# Patient Record
Sex: Male | Born: 1960 | Race: White | Hispanic: No | Marital: Married | Smoking: Never smoker
Health system: Southern US, Community
[De-identification: ages and names within clinical notes are randomized; demographics above are authoritative.]

## PROBLEM LIST (undated history)

## (undated) DIAGNOSIS — E785 Hyperlipidemia, unspecified: Secondary | ICD-10-CM

## (undated) DIAGNOSIS — N189 Chronic kidney disease, unspecified: Secondary | ICD-10-CM

## (undated) DIAGNOSIS — R053 Chronic cough: Secondary | ICD-10-CM

## (undated) DIAGNOSIS — J42 Unspecified chronic bronchitis: Secondary | ICD-10-CM

## (undated) DIAGNOSIS — T7840XA Allergy, unspecified, initial encounter: Secondary | ICD-10-CM

## (undated) DIAGNOSIS — K219 Gastro-esophageal reflux disease without esophagitis: Secondary | ICD-10-CM

## (undated) DIAGNOSIS — J449 Chronic obstructive pulmonary disease, unspecified: Secondary | ICD-10-CM

## (undated) DIAGNOSIS — G4730 Sleep apnea, unspecified: Secondary | ICD-10-CM

## (undated) DIAGNOSIS — I10 Essential (primary) hypertension: Secondary | ICD-10-CM

## (undated) DIAGNOSIS — D235 Other benign neoplasm of skin of trunk: Principal | ICD-10-CM

## (undated) DIAGNOSIS — R05 Cough: Secondary | ICD-10-CM

## (undated) DIAGNOSIS — E119 Type 2 diabetes mellitus without complications: Secondary | ICD-10-CM

## (undated) DIAGNOSIS — G2581 Restless legs syndrome: Secondary | ICD-10-CM

## (undated) HISTORY — DX: Essential (primary) hypertension: I10

## (undated) HISTORY — DX: Chronic obstructive pulmonary disease, unspecified: J44.9

## (undated) HISTORY — PX: POLYPECTOMY: SHX149

## (undated) HISTORY — DX: Hyperlipidemia, unspecified: E78.5

## (undated) HISTORY — PX: COLONOSCOPY: SHX174

## (undated) HISTORY — DX: Unspecified chronic bronchitis: J42

## (undated) HISTORY — DX: Other benign neoplasm of skin of trunk: D23.5

## (undated) HISTORY — DX: Chronic cough: R05.3

## (undated) HISTORY — DX: Type 2 diabetes mellitus without complications: E11.9

## (undated) HISTORY — DX: Chronic kidney disease, unspecified: N18.9

## (undated) HISTORY — DX: Allergy, unspecified, initial encounter: T78.40XA

## (undated) HISTORY — DX: Sleep apnea, unspecified: G47.30

## (undated) HISTORY — DX: Gastro-esophageal reflux disease without esophagitis: K21.9

## (undated) HISTORY — DX: Cough: R05

## (undated) HISTORY — DX: Restless legs syndrome: G25.81

---
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
## 1981-02-23 HISTORY — PX: SEPTOPLASTY: SUR1290

## 2001-10-14 ENCOUNTER — Encounter: Payer: Self-pay | Admitting: Family Medicine

## 2001-10-14 LAB — CONVERTED CEMR LAB: Blood Glucose, Fasting: 102 mg/dL

## 2002-04-03 ENCOUNTER — Encounter: Payer: Self-pay | Admitting: Family Medicine

## 2002-04-03 LAB — CONVERTED CEMR LAB
Blood Glucose, Fasting: 105 mg/dL
RBC count: 5.32 10*6/uL
WBC, blood: 7.2 10*3/uL

## 2003-05-28 ENCOUNTER — Ambulatory Visit (HOSPITAL_BASED_OUTPATIENT_CLINIC_OR_DEPARTMENT_OTHER): Admission: RE | Admit: 2003-05-28 | Discharge: 2003-05-28 | Payer: Self-pay | Admitting: Family Medicine

## 2003-09-03 HISTORY — PX: FOOT SURGERY: SHX648

## 2005-01-01 ENCOUNTER — Ambulatory Visit: Payer: Self-pay | Admitting: Family Medicine

## 2005-04-08 ENCOUNTER — Ambulatory Visit: Payer: Self-pay | Admitting: Family Medicine

## 2007-05-20 ENCOUNTER — Ambulatory Visit: Payer: Self-pay | Admitting: Family Medicine

## 2007-07-25 ENCOUNTER — Ambulatory Visit: Payer: Self-pay | Admitting: Family Medicine

## 2007-07-25 DIAGNOSIS — T7840XA Allergy, unspecified, initial encounter: Secondary | ICD-10-CM | POA: Insufficient documentation

## 2007-08-10 ENCOUNTER — Ambulatory Visit: Payer: Self-pay | Admitting: Family Medicine

## 2007-08-31 ENCOUNTER — Encounter: Payer: Self-pay | Admitting: Family Medicine

## 2007-09-11 ENCOUNTER — Encounter: Payer: Self-pay | Admitting: Family Medicine

## 2008-03-07 ENCOUNTER — Encounter: Payer: Self-pay | Admitting: Family Medicine

## 2008-03-07 DIAGNOSIS — G479 Sleep disorder, unspecified: Secondary | ICD-10-CM | POA: Insufficient documentation

## 2008-03-07 DIAGNOSIS — R030 Elevated blood-pressure reading, without diagnosis of hypertension: Secondary | ICD-10-CM | POA: Insufficient documentation

## 2008-03-07 DIAGNOSIS — Z8719 Personal history of other diseases of the digestive system: Secondary | ICD-10-CM | POA: Insufficient documentation

## 2008-03-07 DIAGNOSIS — G4730 Sleep apnea, unspecified: Secondary | ICD-10-CM | POA: Insufficient documentation

## 2008-03-07 DIAGNOSIS — M109 Gout, unspecified: Secondary | ICD-10-CM | POA: Insufficient documentation

## 2008-07-05 ENCOUNTER — Telehealth: Payer: Self-pay | Admitting: Family Medicine

## 2008-11-12 ENCOUNTER — Ambulatory Visit: Payer: Self-pay | Admitting: Family Medicine

## 2008-11-12 DIAGNOSIS — R079 Chest pain, unspecified: Secondary | ICD-10-CM | POA: Insufficient documentation

## 2008-11-19 ENCOUNTER — Ambulatory Visit: Payer: Self-pay | Admitting: Family Medicine

## 2008-12-03 ENCOUNTER — Telehealth: Payer: Self-pay | Admitting: Family Medicine

## 2009-01-16 ENCOUNTER — Encounter (INDEPENDENT_AMBULATORY_CARE_PROVIDER_SITE_OTHER): Payer: Self-pay | Admitting: Internal Medicine

## 2009-04-26 ENCOUNTER — Ambulatory Visit: Payer: Self-pay | Admitting: Family Medicine

## 2009-05-02 ENCOUNTER — Ambulatory Visit: Payer: Self-pay | Admitting: Family Medicine

## 2009-05-09 ENCOUNTER — Ambulatory Visit: Payer: Self-pay | Admitting: Family Medicine

## 2009-06-13 ENCOUNTER — Ambulatory Visit: Payer: Self-pay | Admitting: Family Medicine

## 2009-06-17 ENCOUNTER — Telehealth: Payer: Self-pay | Admitting: Family Medicine

## 2009-06-21 ENCOUNTER — Telehealth: Payer: Self-pay | Admitting: Family Medicine

## 2009-06-24 ENCOUNTER — Ambulatory Visit: Payer: Self-pay | Admitting: Internal Medicine

## 2009-06-24 ENCOUNTER — Telehealth: Payer: Self-pay | Admitting: Family Medicine

## 2009-06-24 ENCOUNTER — Telehealth (INDEPENDENT_AMBULATORY_CARE_PROVIDER_SITE_OTHER): Payer: Self-pay | Admitting: *Deleted

## 2009-06-24 DIAGNOSIS — R05 Cough: Secondary | ICD-10-CM | POA: Insufficient documentation

## 2009-06-24 DIAGNOSIS — J45901 Unspecified asthma with (acute) exacerbation: Secondary | ICD-10-CM | POA: Insufficient documentation

## 2009-07-08 ENCOUNTER — Ambulatory Visit: Payer: Self-pay | Admitting: Internal Medicine

## 2009-07-11 ENCOUNTER — Ambulatory Visit: Payer: Self-pay | Admitting: Internal Medicine

## 2009-07-11 LAB — CONVERTED CEMR LAB: IgE (Immunoglobulin E), Serum: 4.8 intl units/mL (ref 0.0–180.0)

## 2009-07-30 ENCOUNTER — Ambulatory Visit: Payer: Self-pay | Admitting: Internal Medicine

## 2009-09-10 ENCOUNTER — Ambulatory Visit: Payer: Self-pay | Admitting: Internal Medicine

## 2009-10-01 ENCOUNTER — Encounter (INDEPENDENT_AMBULATORY_CARE_PROVIDER_SITE_OTHER): Payer: Self-pay | Admitting: *Deleted

## 2009-10-29 ENCOUNTER — Telehealth: Payer: Self-pay | Admitting: Family Medicine

## 2010-03-25 NOTE — Progress Notes (Signed)
Summary: flexeril  Phone Note Refill Request Message from:  Fax from Pharmacy on October 29, 2009 11:36 AM  Refills Requested: Medication #1:  FLEXERIL 10 MG  TABS Take 1 tablet by mouth at bedtime as needed   Last Refilled: 12/03/2008 Refill request from [facility].   Initial call taken by: Melody Comas,  October 29, 2009 11:37 AM  Follow-up for Phone Call       Follow-up by: Crawford Givens MD,  October 29, 2009 1:46 PM    Prescriptions: FLEXERIL 10 MG  TABS (CYCLOBENZAPRINE HCL) Take 1 tablet by mouth at bedtime as needed  #30 x 5   Entered and Authorized by:   Crawford Givens MD   Signed by:   Crawford Givens MD on 10/29/2009   Method used:   Electronically to        [facility]* (retail)       [address]       [place], [place]  [address]       Ph: [phone]       Fax: [phone]   RxID:   [id number]

## 2010-03-25 NOTE — Assessment & Plan Note (Signed)
Summary: 1 WK F/U DLO   Vital Signs:  Patient profile:   50 year old male Weight:      291.75 pounds Temp:     97.8 degrees F oral Pulse rate:   92 / minute Pulse rhythm:   regular BP sitting:   142 / 92  (left arm) Cuff size:   large  Vitals Entered By: Sydell Axon LPN (May 09, 2009 8:57 AM) CC: One week follow-up, still has cough   History of Present Illness: Pt here for followup of bronchitis. He has been very compliant and in general is slightly better but has at laest two episodes of paroxysms of coughing that get him to the heaving process. He has no fever and when he is over the paroxysm, feels pretty good the rest of the day. When he coughs he has production some of the time, always clear. He notices minimal wheezing. He has been doing everything asked of him. He finds using the [employer] helpful, drinking enough fluids is the challenge.  Problems Prior to Update: 1)  Bronchitis- Acute  (ICD-466.0) 2)  Chest Pain Unspecified  (ICD-786.50) 3)  Extrinsic Asthma, Unspecified (DR [place])  (ICD-493.00) 4)  Sleep Disorder  (ICD-780.50) 5)  Elevated Blood Pressure Without Diagnosis of Hypertension  (ICD-796.2) 6)  Hemorrhoids, Hx of  (ICD-V12.79) 7)  Gout  (ICD-274.9) 8)  Allergy, Environmental  (ICD-995.3)  Medications Prior to Update: 1)  Allopurinol 300 Mg Tabs (Allopurinol) .... Take 1 Tablet By Mouth Once A Day 2)  Flexeril 10 Mg  Tabs (Cyclobenzaprine Hcl) .... Take 1 Tablet By Mouth At Bedtime 3)  Claritin 10 Mg  Tabs (Loratadine) .... Take 1 Tablet By Mouth Once A Day 4)  Symbicort 80-4.5 Mcg/act Aero (Budesonide-Formoterol Fumarate) .... Use 2 Puffs Every Am As Needed 5)  Ventolin Hfa 108 (90 Base) Mcg/act Aers (Albuterol Sulfate) .... 2 Puffs Inhaled Every 4-6 Hours As Needed Wheeze 6)  Augmentin 500-125 Mg Tabs (Amoxicillin-Pot Clavulanate) .... One Tab By Mouth Three Times A Day 7)  Tessalon 200 Mg Caps (Benzonatate) .... One Tab By Mouth Three Times A  Day  Allergies: 1)  ! * Codeine  Physical Exam  General:  Well-developed,well-nourished,in no acute distress; alert,appropriate and cooperative throughout examination Head:  Normocephalic and atraumatic without obvious abnormalities. No apparent alopecia or balding. Sinuses NT. Eyes:  Conjunctiva clear bilat. Ears:  External ear exam shows no significant lesions or deformities.  Otoscopic examination reveals clear canals, tympanic membranes are intact bilaterally without bulging, retraction, inflammation or discharge. Hearing is grossly normal bilaterally. Nose:  External nasal examination shows no deformity or inflammation. Nasal mucosa are pink and moist without lesions or exudates. Mouth:  Oral mucosa and oropharynx without lesions or exudates.  Teeth in good repair. Neck:  no carotid bruit or thyromegaly no cervical or supraclavicular lymphadenopathy  Lungs:  Normal respiratory effort, chest expands symmetrically. Lungs are clear to auscultation, no crackles or wheezes. Wet , productivre cough. Heart:  Normal rate and regular rhythm. S1 and S2 normal without gallop, murmur, click, rub or other extra sounds.   Impression & Recommendations:  Problem # 1:  BRONCHITIS- ACUTE (ICD-466.0) Assessment Improved  Better slightly. Will change to Zithromax today and then finish Augmentin thereafter.  Increase Symbicort and start prolonged Prednisone Pulse. His updated medication list for this problem includes:    Symbicort 80-4.5 Mcg/act Aero (Budesonide-formoterol fumarate) ..... Use 2 puffs every am as needed    Ventolin Hfa 108 (90 Base) Mcg/act Aers (  Albuterol sulfate) .Marland Kitchen... 2 puffs inhaled every 4-6 hours as needed wheeze    Augmentin 500-125 Mg Tabs (Amoxicillin-pot clavulanate) ..... One tab by mouth three times a day    Tessalon 200 Mg Caps (Benzonatate) ..... One tab by mouth three times a day    Guaifenesin 400 Mg Tabs (Guaifenesin) .Marland Kitchen... Take one by mouth three times a day     Zithromax Z-pak 250 Mg Tabs (Azithromycin) .Marland Kitchen... As dir  Take antibiotics and other medications as directed. Encouraged to push clear liquids, get enough rest, and take acetaminophen as needed. To be seen in 5-7 days if no improvement, sooner if worse.  Problem # 2:  ELEVATED BLOOD PRESSURE WITHOUT DIAGNOSIS OF HYPERTENSION (ICD-796.2) Assessment: Unchanged  Assess a week after steroid taper finished.  BP today: 142/92 Prior BP: 126/90 (05/02/2009)  Instructed in low sodium diet (DASH Handout) and behavior modification.    Complete Medication List: 1)  Allopurinol 300 Mg Tabs (Allopurinol) .... Take 1 tablet by mouth once a day 2)  Flexeril 10 Mg Tabs (Cyclobenzaprine hcl) .... Take 1 tablet by mouth at bedtime 3)  Claritin 10 Mg Tabs (Loratadine) .... Take 1 tablet by mouth once a day 4)  Symbicort 80-4.5 Mcg/act Aero (Budesonide-formoterol fumarate) .... Use 2 puffs every am as needed 5)  Ventolin Hfa 108 (90 Base) Mcg/act Aers (Albuterol sulfate) .... 2 puffs inhaled every 4-6 hours as needed wheeze 6)  Augmentin 500-125 Mg Tabs (Amoxicillin-pot clavulanate) .... One tab by mouth three times a day 7)  Tessalon 200 Mg Caps (Benzonatate) .... One tab by mouth three times a day 8)  Guaifenesin 400 Mg Tabs (Guaifenesin) .... Take one by mouth three times a day 9)  Tylenol Extra Strength 500 Mg Tabs (Acetaminophen) .... As needed 10)  Prednisone 10 Mg Tabs (Prednisone) .... 5 tabs by mouth in am for 5 days, then decrease every three days until take 1/2 tab for 6 days. 11)  Zithromax Z-pak 250 Mg Tabs (Azithromycin) .... As dir  Patient Instructions: 1)  RTC after steroid taper finished. 2)  Check BP then. Prescriptions: ZITHROMAX Z-PAK 250 MG TABS (AZITHROMYCIN) as dir  #1 pak x 0   Entered and Authorized by:   Shaune Leeks MD   Signed by:   Shaune Leeks MD on 05/09/2009   Method used:   Electronically to        [facility]* (retail)       [address]        [place], [place]  [address]       Ph: [phone]       Fax: [phone]   RxID:   [id number]   Current Allergies (reviewed today): ! * CODEINE  Appended Document: 1 WK F/U DLO

## 2010-03-25 NOTE — Assessment & Plan Note (Signed)
Summary: PAIN & CONGESTION IN CHEST / LFW   Vital Signs:  Patient profile:   50 year old male Height:      71.5 inches Weight:      278 pounds BMI:     38.37 Temp:     97.8 degrees F oral Pulse rate:   88 / minute Pulse rhythm:   regular BP sitting:   120 / 88  (left arm) Cuff size:   large  Vitals Entered By: Sydell Axon LPN (November 12, 2008 12:23 PM) CC: Productive cough-white, pain and congestion in chest   History of Present Illness: Pt saw Dr Corinda Gubler last year. Had allergy testing and did well until the weather changes. He had inhalers from before and had some Prednisone to take if needed.  He got cold two weeks ago and then got "hacking his lungs out"  He used 3/2/1 without much improvement. Used decongestant which has made his pressure go up. He got clammy and sweaty yesterday [place] from [place]. He is worried about his heart.  His chest is bothering him. He recently did work above his head on a ladder.  He is having chest pain which is the major problem. Sat he did the work....putting up smoke detectors, replacing a light fixture above his head on a ladder. He also has a stressful job, is overweight, and doesn't exercise. he chest pain does not get worse with exertion, walking the worst. Is also coughing hard at times...has coughing fits. Couldn't get cool enough coming back in the car. He had no nausea.  Son is Public relations account executive and was discussing sxs.  Problems Prior to Update: 1)  Sleep Disorder  (ICD-780.50) 2)  Elevated Blood Pressure Without Diagnosis of Hypertension  (ICD-796.2) 3)  Hemorrhoids, Hx of  (ICD-V12.79) 4)  Gout  (ICD-274.9) 5)  Allergy, Environmental  (ICD-995.3)  Medications Prior to Update: 1)  Allopurinol 300 Mg Tabs (Allopurinol) .... Take 1 Tablet By Mouth Once A Day 2)  Flexeril 10 Mg  Tabs (Cyclobenzaprine Hcl) .... Take 1 Tablet By Mouth At Bedtime 3)  Claritin 10 Mg  Tabs (Loratadine) .... Take 1 Tablet By Mouth Once A Day  Allergies: 1)  ! *  Codeine  Physical Exam  General:  Well-developed,well-nourished,in no acute distress; alert,appropriate and cooperative throughout examination, nontoxic and composed appearing. Head:  Normocephalic and atraumatic without obvious abnormalities. No apparent alopecia or balding. Eyes:  Conjunctiva clear bilaterally.  Ears:  External ear exam shows no significant lesions or deformities.  Otoscopic examination reveals clear canals, tympanic membranes are intact bilaterally without bulging, retraction, inflammation or discharge. Hearing is grossly normal bilaterally. Nose:  External nasal examination shows no deformity or inflammation. Nasal mucosa are pink and moist without lesions or exudates. Mouth:  Oral mucosa and oropharynx without lesions or exudates.  Teeth in good repair. Neck:  No deformities, masses, or tenderness noted. Chest Wall:  No deformities, masses, tenderness or gynecomastia noted. Lungs:  Normal respiratory effort, chest expands symmetrically. Lungs are clear to auscultation, no crackles or wheezes. Good airflow but reactive cough with deep breathing. Heart:  Normal rate and regular rhythm. S1 and S2 normal without gallop, murmur, click, rub or other extra sounds.   Impression & Recommendations:  Problem # 1:  EXTRINSIC ASTHMA, UNSPECIFIED (DR [place]) (ICD-493.00) Assessment Deteriorated See instructions. His updated medication list for this problem includes:    Symbicort 80-4.5 Mcg/act Aero (Budesonide-formoterol fumarate) ..... Use 2 puffs every am as needed    Prednisone 50 Mg  Tabs (Prednisone) ..... One tab by mouth in am  Problem # 2:  ELEVATED BLOOD PRESSURE WITHOUT DIAGNOSIS OF HYPERTENSION (ICD-796.2) Assessment: Improved  Normotensive today. Wt loss will help even more.  BP today: 120/88 Prior BP: 115/78 (08/10/2007)  Instructed in low sodium diet (DASH Handout) and behavior modification.    Problem # 3:  CHEST PAIN UNSPECIFIED (ICD-786.50) Assessment: New   Does not appear to be the heart. EKG nml with Discomfort. Prob unusual work with anxiety overlay. Will recheck next week.  Orders: EKG w/ Interpretation (93[address])  Complete Medication List: 1)  Allopurinol 300 Mg Tabs (Allopurinol) .... Take 1 tablet by mouth once a day 2)  Flexeril 10 Mg Tabs (Cyclobenzaprine hcl) .... Take 1 tablet by mouth at bedtime 3)  Claritin 10 Mg Tabs (Loratadine) .... Take 1 tablet by mouth once a day 4)  Symbicort 80-4.5 Mcg/act Aero (Budesonide-formoterol fumarate) .... Use 2 puffs every am as needed 5)  Prednisone 50 Mg Tabs (Prednisone) .... One tab by mouth in am  Patient Instructions: 1)  Prednisone 50mg  first thing in the AM. for five days. 2)  Cont Symbicort regularly....2 inhalations twice a day. 3)  Use Albuterol only as needed. 4)  Keep lozenge in mouth. 5)  Cont Claritin. 6)  RTC one week. Prescriptions: PREDNISONE 50 MG TABS (PREDNISONE) one tab by mouth in AM  #5 x 0   Entered and Authorized by:   Shaune Leeks MD   Signed by:   Shaune Leeks MD on 11/12/2008   Method used:   Electronically to        [facility]* (retail)       [address]       [place], [place]  [address]       Ph: [phone]       Fax: [phone]   RxID:   [id number]   Current Allergies (reviewed today): ! * CODEINE

## 2010-03-25 NOTE — Progress Notes (Signed)
Summary: Pt wants call back from Dr. Dayton Martes   Phone Note Call from Patient   Caller: Patient Summary of Call: Pt was referred to the Pulumonary Dept w/ Dr. Shelle Iron.   Pts called says he has not be contacted for his Pulumonary appt w/ Dr. Shelle Iron. My I comments indicate appt was scheduled and pt was contacted  on April 26th ( however I  do not remember)  .Marland KitchenPt says he is not any better, medication is not helping.  I have rescheduled appt w/ their first avail Dr. Sherene Sires on May 2nd,Monday . Not sure if  pt is pleased w/ this appt.  Pt would like a call  back from Dr. Dayton Martes.Marland KitchenMarland KitchenDaine Gip  June 21, 2009 12:18 PM Call back # [phone] Initial call taken by: Daine Gip,  June 21, 2009 12:21 PM  Follow-up for Phone Call        I am extremely busy today and do not have time to call him back.  What exactly is asking for?  Does Lugene need to try to call him? Ruthe Mannan MD  June 21, 2009 12:33 PM   Additional Follow-up for Phone Call Additional follow up Details #1::        Pt went to the appt w/ Dr. Sherene Sires  (pulumonary )on Monday. He wanted to make sure you were aware that he was no better..sigbn Additional Follow-up by: Daine Gip,  Jun 25, 2009 9:40 AM    Additional Follow-up for Phone Call Additional follow up Details #2::    I'm not sure what he wants [place] to do at this point.  I read Dr. Thurston Hole note.  It will take time for the medications to take effect.  I would like Dr. Hetty Ely know at his next appointment but keep taking medications as prescribed.   Ruthe Mannan MD  Jun 25, 2009 9:42 AM  Spoke with patient and advised that it may take some time for the medication to take effect but that if he is not doing better in the next day or two, he might want to try to contact Dr. Thurston Hole office or make an appt. here.  Lugene Fuquay CMA (AAMA)  Jun 25, 2009 11:08 AM

## 2010-03-25 NOTE — Assessment & Plan Note (Signed)
Summary: FOLLOW UP DR BEDSOLE/[facility]   Vital Signs:  Patient profile:   49 year old male Weight:      286 pounds Temp:     97.8 degrees F oral Pulse rate:   92 / minute Pulse rhythm:   regular BP sitting:   126 / 90  (left arm) Cuff size:   large  Vitals Entered By: Sydell Axon LPN (May 02, 2009 12:50 PM) CC: Follow-up on cough per Dr. Ermalene Searing   History of Present Illness: Mr. Courville is a 50 y/o male who presents today for a f/u from an exacerbation of bronchitis last week.  Patient has a history of bronchitis and was seen last week for an exacerbation that led to bouts of coughing "so much that it hurt."  Dr. Ermalene Searing prescribed Prednisone which he has just completed and could tell a mild difference.  He also has a history of allergies and attacks with difficulty breathing.  He uses albuterol as a rescue inhaler which helps but he often needs about 20 minutes to regain his breath.  About 6 months ago, he was prescribed an antibiotic after which he was free from symptoms until last week.  That was the longest symptom-free period he can remember.  Problems Prior to Update: 1)  Asthma, Extrinsic, With Acute Exacerbation  (ICD-493.02) 2)  Chest Pain Unspecified  (ICD-786.50) 3)  Extrinsic Asthma, Unspecified (DR [place])  (ICD-493.00) 4)  Sleep Disorder  (ICD-780.50) 5)  Elevated Blood Pressure Without Diagnosis of Hypertension  (ICD-796.2) 6)  Hemorrhoids, Hx of  (ICD-V12.79) 7)  Gout  (ICD-274.9) 8)  Allergy, Environmental  (ICD-995.3)  Medications Prior to Update: 1)  Allopurinol 300 Mg Tabs (Allopurinol) .... Take 1 Tablet By Mouth Once A Day 2)  Flexeril 10 Mg  Tabs (Cyclobenzaprine Hcl) .... Take 1 Tablet By Mouth At Bedtime 3)  Claritin 10 Mg  Tabs (Loratadine) .... Take 1 Tablet By Mouth Once A Day 4)  Symbicort 80-4.5 Mcg/act Aero (Budesonide-Formoterol Fumarate) .... Use 2 Puffs Every Am As Needed 5)  Prednisone 20 Mg Tabs (Prednisone) .... 3 Tabs By Mouth Daily X 3 Days, Then  2 Tabs By Mouth Daily X 2 Days Then 1 Tab By Mouth Daily X 2 Days 6)  Ventolin Hfa 108 (90 Base) Mcg/act Aers (Albuterol Sulfate) .... 2 Puffs Inhaled Every 4-6 Hours As Needed Wheeze  Allergies: 1)  ! * Codeine  Physical Exam  General:  Well-developed,well-nourished,in no acute distress; alert,appropriate and cooperative throughout examination Head:  Normocephalic and atraumatic without obvious abnormalities. No apparent alopecia or balding. Sinuses NT. Eyes:  Conjunctiva clear bilat. Ears:  External ear exam shows no significant lesions or deformities.  Otoscopic examination reveals clear canals, tympanic membranes are intact bilaterally without bulging, retraction, inflammation or discharge. Hearing is grossly normal bilaterally. Nose:  External nasal examination shows no deformity or inflammation. Nasal mucosa are pink and moist without lesions or exudates. Mouth:  Oral mucosa and oropharynx without lesions or exudates.  Teeth in good repair. Lungs:  Normal respiratory effort, chest expands symmetrically. Lungs are clear to auscultation, no crackles or wheezes. Mild ronchi in ant bases bilat. Wet , productivre cough. Heart:  Normal rate and regular rhythm. S1 and S2 normal without gallop, murmur, click, rub or other extra sounds.   Impression & Recommendations:  Problem # 1:  BRONCHITIS- ACUTE (ICD-466.0) Assessment New Actually asthmatic bronchitis...treat per instructions. His updated medication list for this problem includes:    Symbicort 80-4.5 Mcg/act Aero (Budesonide-formoterol fumarate) .Marland KitchenMarland KitchenMarland KitchenMarland Kitchen  Use 2 puffs every am as needed    Ventolin Hfa 108 (90 Base) Mcg/act Aers (Albuterol sulfate) .Marland Kitchen... 2 puffs inhaled every 4-6 hours as needed wheeze    Augmentin 500-125 Mg Tabs (Amoxicillin-pot clavulanate) ..... One tab by mouth three times a day    Tessalon 200 Mg Caps (Benzonatate) ..... One tab by mouth three times a day  Problem # 2:  EXTRINSIC ASTHMA, UNSPECIFIED (DR [place])  (ICD-493.00) Assessment: Improved Mildly improved. See instuctions. The following medications were removed from the medication list:    Prednisone 20 Mg Tabs (Prednisone) .Marland KitchenMarland KitchenMarland KitchenMarland Kitchen 3 tabs by mouth daily x 3 days, then 2 tabs by mouth daily x 2 days then 1 tab by mouth daily x 2 days His updated medication list for this problem includes:    Symbicort 80-4.5 Mcg/act Aero (Budesonide-formoterol fumarate) ..... Use 2 puffs every am as needed    Ventolin Hfa 108 (90 Base) Mcg/act Aers (Albuterol sulfate) .Marland Kitchen... 2 puffs inhaled every 4-6 hours as needed wheeze  Complete Medication List: 1)  Allopurinol 300 Mg Tabs (Allopurinol) .... Take 1 tablet by mouth once a day 2)  Flexeril 10 Mg Tabs (Cyclobenzaprine hcl) .... Take 1 tablet by mouth at bedtime 3)  Claritin 10 Mg Tabs (Loratadine) .... Take 1 tablet by mouth once a day 4)  Symbicort 80-4.5 Mcg/act Aero (Budesonide-formoterol fumarate) .... Use 2 puffs every am as needed 5)  Ventolin Hfa 108 (90 Base) Mcg/act Aers (Albuterol sulfate) .... 2 puffs inhaled every 4-6 hours as needed wheeze 6)  Augmentin 500-125 Mg Tabs (Amoxicillin-pot clavulanate) .... One tab by mouth three times a day 7)  Tessalon 200 Mg Caps (Benzonatate) .... One tab by mouth three times a day  Patient Instructions: 1)  RTC 21 Mar  2)  Take Augmentin  3)  Take Guaifenesin by going to [facility] or [facility] and getting MUCOUS RELIEF EXPECTORANT (400mg ), take 11/2 tabs by mouth AM and NOON. 4)  Drink lots of fluids anytime taking Guaifenesin.  5)  Take Tyl ES 2 tabs three times a day  6)  Take Tessalon for cough 7)  Keep lozenge in mouth  8)  Cont Symbicort and use Albuterol for rescue. Prescriptions: TESSALON 200 MG CAPS (BENZONATATE) one tab by mouth three times a day  #30 x 0   Entered and Authorized by:   Shaune Leeks MD   Signed by:   Shaune Leeks MD on 05/02/2009   Method used:   Electronically to        [facility]* (retail)        [address]       [place], [place]  [address]       Ph: [phone]       Fax: [phone]   RxID:   [id number] AUGMENTIN 500-125 MG TABS (AMOXICILLIN-POT CLAVULANATE) one tab by mouth three times a day  #30 x 0   Entered and Authorized by:   Shaune Leeks MD   Signed by:   Shaune Leeks MD on 05/02/2009   Method used:   Electronically to        [facility]* (retail)       [address]       [place], [place]  [address]       Ph: [phone]       Fax: [phone]   RxID:   [id number]   Current Allergies (reviewed today): ! * CODEINE

## 2010-03-25 NOTE — Assessment & Plan Note (Signed)
Summary: Pulmonary/  f/u ov  response to qvar c/w cough variant asthma   Copy to:  Dr. Dayton Martes Primary Provider/Referring Provider:  Dr. Dayton Martes  CC:  3 wk followup.  Pt states that his cough has resolved.  No complaints today.  Marland Kitchen  History of Present Illness: 50 yowm never smoker no previous hx of allergies or asthma new onset recurrent bronchitis 2005 never completely better since then.  Jun 24, 2009 cc daily cough x 2005 maybe worse in spring and fall seems to peak after stirs around in am can become severe and lightheaded, also occ vomits from coughing.  ventolin may helps some but does not eliminate it.   prednisone helps some, just finished prednisone last month and much worse since stopped it.  minimal actual sputum production. assoc with mild chronic nasal congestion.  rec Prednisone x 12 days Nexium 40 mg Take  one 30-60 min before first meal of the day Pepcid 20 mg at bedtime Delsym 2 tsp every 12 hours as needed for cough Stop symbicort  Jul 08, 2009 2 wk followup.  Pt states that cough had improved while on prednisone.  When he finished prednisone a few days ago cough started to come back.  Cough was prod this am with white/yellow sputum.  This has occurred despite compliance with diet and ppi.   rec add qvar 80 2 bid   July 30, 2009 3 wk followup.  Pt states that his cough has resolved.  No complaints today.  able to do yard work now. Pt denies any significant sore throat, dysphagia, itching, sneezing,  nasal congestion or excess secretions,  fever, chills, sweats, unintended wt loss, pleuritic or exertional cp, hempoptysis, change in activity tolerance  orthopnea pnd or leg swelling Pt also denies any obvious fluctuation in symptoms with weather or environmental change or other alleviating or aggravating factors.       Current Medications (verified): 1)  Allopurinol 300 Mg Tabs (Allopurinol) .... Take 1 Tablet By Mouth Once A Day 2)  Singulair 10 Mg Tabs (Montelukast Sodium) .Marland Kitchen.. 1 By  Mouth Daily 3)  Protonix 40 Mg Tbec (Pantoprazole Sodium) .... Take One By Mouth 30 To 60 Min Before First Meal 4)  Pepcid Ac Maximum Strength 20 Mg Tabs (Famotidine) .... One At Bedtime 5)  Qvar 80 Mcg/act Aers (Beclomethasone Dipropionate) .... 2 Puffs First Thing  in Am and 2 Puffs Again in Pm About 12 Hours Later 6)  Tylenol Extra Strength 500 Mg Tabs (Acetaminophen) .... As Needed 7)  Ventolin Hfa 108 (90 Base) Mcg/act Aers (Albuterol Sulfate) .... 2 Puffs Inhaled Every 4-6 Hours As Needed Wheeze 8)  Flexeril 10 Mg  Tabs (Cyclobenzaprine Hcl) .... Take 1 Tablet By Mouth At Bedtime As Needed 9)  Delsym 30 Mg/27ml Lqcr (Dextromethorphan Polistirex) .Marland Kitchen.. 1 To 2 Tsp Every 12 Hours As Needed  Allergies (verified): 1)  ! * Codeine  Past History:  Past Medical History: Allergy w/u Dr Stevphen [place]     - Maybe trees > did not rec shots per pt Chronic cough     - onset 2005     - Allergy profile sent Jul 08, 2009 >> neg      - Sinus Ct ordered Jul 08, 2009 >> neg     - Qvar trial Jul 08, 2009 >> much better July 30, 2009 with 90% effective hfa  Vital Signs:  Patient profile:   50 year old male Weight:  275 pounds O2 Sat:      97 % on Room air Temp:     97.5 degrees F oral Pulse rate:   90 / minute BP sitting:   112 / 70  (left arm) Cuff size:   large  Vitals Entered By: Vernie Murders (July 30, 2009 11:00 AM)  O2 Flow:  Room air   Physical Exam  Additional Exam:  wt  294 Jun 24, 2009 > 277 Jul 08, 2009 > 275 July 30, 2009  amb obese wm with classic upper airway coughing HEENT: nl dentition, mod bilateral nonspecific swelling of  turbinates, and  nl orophanx. Nl external ear canals without cough reflex NECK :  without JVD/Nodes/TM/ nl carotid upstrokes bilaterally LUNGS: no acc muscle use, clear to A and P bilaterally without cough on insp or exp maneuvers CV:  RRR  no s3 or murmur or increase in P2, no edema  ABD:  soft and nontender with nl excursion in the supine  position. No bruits or organomegaly, bowel sounds nl MS:  warm without deformities, calf tenderness, cyanosis or clubbing    Impression & Recommendations:  Problem # 1:  COUGH (ICD-786.2)  Steroid responsive and now flared off steroids despite compliance on acid suppression and diet.  Therefore cough variant asthma and eos bronchitis in ddx so rx  qvar 80  >  excellent response strongly suggest cough variant asthma.  I spent extra time with the patient today explaining optimal mdi  technique.  This improved from 75-90%  Since doing so well should be able to reduce the maint rx to qvar to 40 2 puffs first thing  in am and 2 puffs again in pm about 12 hours later using the concept that the lowest dose that's effective is the best dose.  Each maintenance medication was reviewed in detail including most importantly the difference between maintenance prns and under what circumstances the prns are to be used.  In addition, these two groups (for which the patient should keep up with refills) were distinguished from a third group :  meds that are used only short term with the intent to complete a course of therapy and then not refill them.  The med list was then fully reconciled and reorganized to reflect this important distinction.   Orders: Est. Patient Level IV (04540)  Medications Added to Medication List This Visit: 1)  Qvar 40 Mcg/act Aers (Beclomethasone dipropionate) .... 2 puffs first thing  in am and 2 puffs again in pm about 12 hours later  Patient Instructions: 1)  Try qvar 40 2 puffs first thing  in am and 2 puffs again in pm about 12 hours later  2)  Try stop pepcid at bedtime 3)  only take the cough medicine if needed  4)  Please schedule a follow-up appointment in 6 weeks, sooner if needed  Prescriptions: QVAR 40 MCG/ACT  AERS (BECLOMETHASONE DIPROPIONATE) 2 puffs first thing  in am and 2 puffs again in pm about 12 hours later  #1 x 11   Entered and Authorized by:   Nyoka Cowden MD   Signed by:   Nyoka Cowden MD on 07/30/2009   Method used:   Electronically to        [facility]* (retail)       [address]       [place], [place]  [address]       Ph: [phone]       Fax:  [phone]   RxID:   [id number]

## 2010-03-25 NOTE — Progress Notes (Signed)
Summary: Rx Ventolin  Phone Note Refill Request Call back at [phone] Message from:  [facility] on Jun 24, 2009 9:49 AM  Refills Requested: Medication #1:  VENTOLIN HFA 108 (90 BASE) MCG/ACT AERS 2 puffs inhaled every 4-6 hours as needed wheeze   Last Refilled: 06/12/2009 Received faxed refill request please advise.   Method Requested: Electronic Initial call taken by: Linde Gillis CMA [facility] Dull),  Jun 24, 2009 9:52 AM  Follow-up for Phone Call        Just refilled in MArch 1 with 2 RF..using to much albuterol..needs to await Dr. Lorenza Chick recs.  Follow-up by: Kerby Nora MD,  Jun 24, 2009 11:42 AM    Prescriptions: VENTOLIN HFA 108 (90 BASE) MCG/ACT AERS (ALBUTEROL SULFATE) 2 puffs inhaled every 4-6 hours as needed wheeze  #1 x 0   Entered by:   Shaune Leeks MD   Authorized by:   Kerby Nora MD   Signed by:   Shaune Leeks MD on 06/24/2009   Method used:   Electronically to        [facility]* (retail)       [address]       [place], [place]  [address]       Ph: [phone]       Fax: [phone]   RxID:   [id number]  If pt needs another prescription before three months, he needs to be seen. Pls give Dr Thurston Hole approach a try.  Shaune Leeks MD  Jun 24, 2009 1:03 PM   Patient advised as instructed via message left on personal voicemail at work.  Linde Gillis CMA [facility] Dull)  Jun 24, 2009 3:14 PM

## 2010-03-25 NOTE — Progress Notes (Signed)
Summary: RX Flexeril  Phone Note Refill Request Call back at [phone] Message from:  [facility] on December 03, 2008 12:32 PM  Refills Requested: Medication #1:  FLEXERIL 10 MG  TABS Take 1 tablet by mouth at bedtime   Last Refilled: 07/05/2008  Method Requested: Electronic Initial call taken by: Sydell Axon LPN,  December 03, 2008 12:33 PM    Prescriptions: FLEXERIL 10 MG  TABS (CYCLOBENZAPRINE HCL) Take 1 tablet by mouth at bedtime  #30 x 0   Entered and Authorized by:   Shaune Leeks MD   Signed by:   Shaune Leeks MD on 12/03/2008   Method used:   Electronically to        [facility]* (retail)       [address]       [place], [place]  [address]       Ph: [phone]       Fax: [phone]   RxID:   [id number]

## 2010-03-25 NOTE — Assessment & Plan Note (Signed)
Summary: ASTHMA/CLE   Vital Signs:  Patient profile:   50 year old male Height:      71.5 inches Weight:      291.25 pounds BMI:     40.20 Temp:     97.5 degrees F oral Pulse rate:   76 / minute Pulse rhythm:   regular BP sitting:   130 / 84  (right arm) Cuff size:   large  Vitals Entered By: Linde Gillis CMA [facility]) (June 13, 2009 9:48 AM) CC: asthma, ? bronchitis   History of Present Illness: Pt new to me here for followup of bronchitis/asthma.  Has a complicated history of asthma, symptoms have steadily been worsening over past several months.  PMH reviewed- was seen last month for similar symptoms of productive cough and  wheezing.  FIrst placed on Augmentin and long steroid taper.  When symptoms continued, changed to Zpack and Symbicort dose was increased to 160/4.5.  Never truly felt any relief, finished his prednisone last week. He is using her Symbicort along with Albuterol multiple times per day.  Felt feverish with chills last night.  Symptoms much worse when he is outdoors. Taking Claritin OTC with no relief of symptoms.  Current Medications (verified): 1)  Allopurinol 300 Mg Tabs (Allopurinol) .... Take 1 Tablet By Mouth Once A Day 2)  Flexeril 10 Mg  Tabs (Cyclobenzaprine Hcl) .... Take 1 Tablet By Mouth At Bedtime 3)  Claritin 10 Mg  Tabs (Loratadine) .... Take 1 Tablet By Mouth Once A Day 4)  Ventolin Hfa 108 (90 Base) Mcg/act Aers (Albuterol Sulfate) .... 2 Puffs Inhaled Every 4-6 Hours As Needed Wheeze 5)  Tylenol Extra Strength 500 Mg Tabs (Acetaminophen) .... As Needed 6)  Symbicort 160-4.5 Mcg/act  Aero (Budesonide-Formoterol Fumarate) .... 2 Inh Bid 7)  Singulair 10 Mg Tabs (Montelukast Sodium) .Marland Kitchen.. 1 By Mouth Daily 8)  Avelox 400 Mg  Tabs (Moxifloxacin Hcl) .Marland Kitchen.. 1 Tablet By Mouth Daily X 10 Days  Allergies: 1)  ! * Codeine  Review of Systems      See HPI General:  Complains of chills and fever. CV:  Denies chest pain or discomfort. Resp:   Complains of shortness of breath, sputum productive, and wheezing.  Physical Exam  General:  Well-developed,well-nourished,in no acute distress; alert,appropriate and cooperative throughout examination, no increased WOB Ears:  External ear exam shows no significant lesions or deformities.  Otoscopic examination reveals clear canals, tympanic membranes are intact bilaterally without bulging, retraction, inflammation or discharge. Hearing is grossly normal bilaterally. Nose:  External nasal examination shows no deformity or inflammation. Nasal mucosa are pink and moist without lesions or exudates. Mouth:  Oral mucosa and oropharynx without lesions or exudates.  Teeth in good repair. Lungs:  Normal respiratory effort, chest expands symmetrically.No crackles, diffuse exp wheezes, right> left. Wet , productivre cough. Heart:  Normal rate and regular rhythm. S1 and S2 normal without gallop, murmur, click, rub or other extra sounds. Psych:  memory intact for recent and remote, normally interactive, and good eye contact.     Impression & Recommendations:  Problem # 1:  EXTRINSIC ASTHMA, UNSPECIFIED ([place]) (ICD-493.00) Assessment Deteriorated with persistent bronchitis, likely bacterial. Spent over 25 minutes discussing with pt and wife. I do feel he would benefit from a pulmonary consult, true PFTs. He is very compliant yet asthma is poorly controlled. Will d/c claritin and start Singulair 10 mg daily. Also try 10 day course of Avelox as Zpack and Augmentin have failed and have similar coverage.  Continue higher dose Symbicort. Follow up if no improvement in 5 days. The following medications were removed from the medication list:    Symbicort 80-4.5 Mcg/act Aero (Budesonide-formoterol fumarate) ..... Use 2 puffs every am as needed    Prednisone 10 Mg Tabs (Prednisone) .Marland KitchenMarland KitchenMarland KitchenMarland Kitchen 5 tabs by mouth in am for 5 days, then decrease every three days until take 1/2 tab for 6 days. His updated medication  list for this problem includes:    Ventolin Hfa 108 (90 Base) Mcg/act Aers (Albuterol sulfate) .Marland Kitchen... 2 puffs inhaled every 4-6 hours as needed wheeze    Symbicort 160-4.5 Mcg/act Aero (Budesonide-formoterol fumarate) .Marland Kitchen... 2 inh bid    Singulair 10 Mg Tabs (Montelukast sodium) .Marland Kitchen... 1 by mouth daily  Complete Medication List: 1)  Allopurinol 300 Mg Tabs (Allopurinol) .... Take 1 tablet by mouth once a day 2)  Flexeril 10 Mg Tabs (Cyclobenzaprine hcl) .... Take 1 tablet by mouth at bedtime 3)  Claritin 10 Mg Tabs (Loratadine) .... Take 1 tablet by mouth once a day 4)  Ventolin Hfa 108 (90 Base) Mcg/act Aers (Albuterol sulfate) .... 2 puffs inhaled every 4-6 hours as needed wheeze 5)  Tylenol Extra Strength 500 Mg Tabs (Acetaminophen) .... As needed 6)  Symbicort 160-4.5 Mcg/act Aero (Budesonide-formoterol fumarate) .... 2 inh bid 7)  Singulair 10 Mg Tabs (Montelukast sodium) .Marland Kitchen.. 1 by mouth daily 8)  Avelox 400 Mg Tabs (Moxifloxacin hcl) .Marland Kitchen.. 1 tablet by mouth daily x 10 days  Patient Instructions: 1)  Great to see you,  Richard Giles. 2)  Stop taking Claritin, try Singulair daily. 3)  Avelox 400 mg daily for 10 days. 4)  come see me or Dr. Hetty Ely if no improvement in 5 days. Prescriptions: AVELOX 400 MG  TABS (MOXIFLOXACIN HCL) 1 tablet by mouth daily x 10 days  #10 x 0   Entered and Authorized by:   Ruthe Mannan MD   Signed by:   Ruthe Mannan MD on 06/13/2009   Method used:   Electronically to        [facility]* (retail)       [address]       [place], [place]  [address]       Ph: [phone]       Fax: [phone]   RxID:   [id number] SINGULAIR 10 MG TABS (MONTELUKAST SODIUM) 1 by mouth daily  #90 x 0   Entered and Authorized by:   Ruthe Mannan MD   Signed by:   Ruthe Mannan MD on 06/13/2009   Method used:   Electronically to        [facility]* (retail)       [address]       [place], [place]  [address]       Ph: [phone]       Fax: [phone]   RxID:    [id number] SYMBICORT 160-4.5 MCG/ACT  AERO (BUDESONIDE-FORMOTEROL FUMARATE) 2 inh bid  #1 x 1   Entered and Authorized by:   Ruthe Mannan MD   Signed by:   Ruthe Mannan MD on 06/13/2009   Method used:   Electronically to        [facility]* (retail)       [address]       [place], [place]  [address]       Ph: [phone]       Fax: [phone]   RxID:   [id number]     Current Allergies (reviewed today): ! * CODEINE

## 2010-03-25 NOTE — Miscellaneous (Signed)
Summary: flu shot injection history   Clinical Lists Changes  Observations: Added new observation of FLU VAX: Historical (01/04/2009 15:00)      Immunization History:  Influenza Immunization History:    Influenza:  historical (01/04/2009)

## 2010-03-25 NOTE — Progress Notes (Signed)
Summary: PA for nexium > ok to substitute- Pt RETURNED CALL  Phone Note Outgoing Call Call back at [phone]   Call placed by: Vernie Murders,  Jun 24, 2009 4:31 PM Call placed to: Patient Summary of Call: Called [employer] initiate PA for nexium 40 mg once daily.  Case ID number is [id number].  Will await fax to be sent to triage faxline Initial call taken by: Vernie Murders,  Jun 24, 2009 4:31 PM  Follow-up for Phone Call        PA form recieved and placed in MW lookat to be signed Vernie Murders  Jun 24, 2009 5:12 PM pick one that 's not on the PA list, like generic protonix or aciphex Follow-up by: Nyoka Cowden MD,  Jun 24, 2009 5:13 PM  Additional Follow-up for Phone Call Additional follow up Details #1::        I have sent in protonix rx to [facility].  lmomtcb to advise pt of this change.  pt returned call. call him on cell # [phone]. Tivis Ringer, CNA  Jun 25, 2009 2:07 PM Additional Follow-up by: Vernie Murders,  Jun 25, 2009 1:43 PM    Additional Follow-up for Phone Call Additional follow up Details #2::    I called and spoke with pt and advised that we changed the nexium to protonix due to insurance issues.  Pt verbalized understanding and is aware that rx has already been sent.   Follow-up by: Vernie Murders,  Jun 25, 2009 2:26 PM  New/Updated Medications: PROTONIX 40 MG TBEC (PANTOPRAZOLE SODIUM) take one by mouth 30 to 60 min before first meal Prescriptions: PROTONIX 40 MG TBEC (PANTOPRAZOLE SODIUM) take one by mouth 30 to 60 min before first meal  #30 x 6   Entered by:   Vernie Murders   Authorized by:   Nyoka Cowden MD   Signed by:   Vernie Murders on 06/25/2009   Method used:   Electronically to        [facility]* (retail)       [address]       [place], [place]  [address]       Ph: [phone]       Fax: [phone]   RxID:   [id number]

## 2010-03-25 NOTE — Assessment & Plan Note (Signed)
Summary: Pulmonary consultation/ cough eval    Visit Type:  Initial Consult Copy to:  Dr. Dayton Martes Primary Provider/Referring Provider:  Dr. Dayton Martes  CC:  Cough.  History of Present Illness: 25 yowm never smoker no previous hx of allergies or asthma new onset recurrent bronchitis 2005 never completely better since then.  Jun 24, 2009 cc daily cough x 2005 maybe worse in spring and fall seems to peak after stirs around in am can become severe and lightheaded, also occ vomits from coughing.  ventolin may helps some but does not eliminate it.   prednisone helps some, just finished prednisone last month and much worse since stopped it.  minimal actual sputum production. assoc with mild chronic nasal congestion.     Pt denies any significant sore throat, dysphagia, itching, sneezing,  fever, chills, sweats, unintended wt loss, pleuritic or exertional cp, hempoptysis, change in activity tolerance  orthopnea pnd or leg swelling Pt also denies any obvious fluctuation in symptoms with weather or environmental change or other alleviating or aggravating factors.       Current Medications (verified): 1)  Allopurinol 300 Mg Tabs (Allopurinol) .... Take 1 Tablet By Mouth Once A Day 2)  Flexeril 10 Mg  Tabs (Cyclobenzaprine Hcl) .... Take 1 Tablet By Mouth At Bedtime As Needed 3)  Ventolin Hfa 108 (90 Base) Mcg/act Aers (Albuterol Sulfate) .... 2 Puffs Inhaled Every 4-6 Hours As Needed Wheeze 4)  Tylenol Extra Strength 500 Mg Tabs (Acetaminophen) .... As Needed 5)  Symbicort 160-4.5 Mcg/act  Aero (Budesonide-Formoterol Fumarate) .... 2 Inh Bid 6)  Singulair 10 Mg Tabs (Montelukast Sodium) .Marland Kitchen.. 1 By Mouth Daily  Allergies (verified): 1)  ! * Codeine  Past History:  Past Medical History: Allergy w/u Dr Stevphen [place]     - Maybe trees > did not rec shots per pt Chronic cough     - onset 2005  Family History: Reviewed history from 03/07/2008 and no changes required. 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  The patient denies chest pain, irregular heartbeats, acid heartburn, indigestion, loss of appetite, weight change, abdominal pain, difficulty swallowing, sore throat, tooth/dental problems, sneezing, itching, ear ache, anxiety, depression, hand/feet swelling, joint stiffness or pain, and rash.    Vital Signs:  Patient profile:   50 year old male Weight:      294 pounds O2 Sat:      98 % on Room air Temp:     97.6 degrees F oral Pulse rate:   95 / minute BP sitting:   160 / 98  (right arm) Cuff size:   large  Vitals Entered By: Vernie Murders (Jun 24, 2009 10:38 AM)  O2 Flow:  Room air  Physical Exam  Additional Exam:  wt 291 > 294 Jun 24, 2009  amb obese wm with classic upper airway coughing HEENT: nl dentition, mod bilateral nonspecific swelling of  turbinates, and  nl orophanx. Nl  external ear canals without cough reflex NECK :  without JVD/Nodes/TM/ nl carotid upstrokes bilaterally LUNGS: no acc muscle use, clear to A and P bilaterally without cough on insp or exp maneuvers CV:  RRR  no s3 or murmur or increase in P2, no edema  ABD:  soft and nontender with nl excursion in the supine position. No bruits or organomegaly, bowel sounds nl MS:  warm without deformities, calf tenderness, cyanosis or clubbing SKIN: warm and dry without lesions    NEURO:  alert, approp, no deficits     CXR  Procedure date:  06/24/2009  Findings:      Findings: Cardiomediastinal silhouette is unremarkable.  No acute infiltrate or pleural effusion.  No pulmonary edema.  Bony thorax is unremarkable. Minimal hyperinflation.   IMPRESSION: No active disease. Mild hyperinflation.  Impression & Recommendations:  Problem # 1:  COUGH (ICD-786.2)  The most common causes of chronic cough in immunocompetent adults include: upper airway cough syndrome (UACS), previously referred to as postnasal drip syndrome,  caused by variety of rhinosinus conditions; (2) asthma; (3) GERD; (4) chronic bronchitis from cigarette smoking or other inhaled environmental irritants; (5) nonasthmatic eosinophilic bronchitis; and (6) bronchiectasis. These conditions, singly or in combination, have accounted for up to 94% of the causes of chronic cough in prospective studies.  this is most c/w  Upper airway cough syndrome, so named because it's frequently impossible to sort out how much is  CR/sinusitis with freq throat clearing (which can be related to primary GERD)   vs  causing  secondary extra esophageal GERD from wide swings in gastric pressure that occur with throat clearing, promoting self use of mint and menthol lozenges ( which he admits to using lots of)  that reduce the lower esophageal sphincter tone and exacerbate the problem further These are the same pts who not infrequently have failed to tolerate ace inhibitors,  dry powder inhalers or biphosphonates or report having reflux symptoms that don't respond to standard doses of PPI .   The standardized cough guidelines recently published in Chest are a 14 step process, not a single office visit,  and are intended  to address this problem logically,  with an alogrithm dependent on response to each progressive step  to determine a specific diagnosis with  minimal addtional testing needed. Therefore if compliance is an issue  this empiric standardized approach simply won't work.   For now See instructions for specific recommendations   Orders: Consultation Level V ([address]) Consultation Level V ([address])  Medications Added to Medication List This Visit: 1)  Nexium 40 Mg Cpdr (Esomeprazole magnesium) .... Take  one 30-60 min before first meal of the day 2)  Pepcid Ac Maximum Strength 20 Mg Tabs (Famotidine) .... One at bedtime 3)  Flexeril 10 Mg Tabs (Cyclobenzaprine hcl) .... Take 1 tablet by mouth at bedtime as needed 4)  Prednisone 10 Mg Tabs (Prednisone) .... 4 each am x 3 days,  3 x 3 days, 2x3 days, and 1x3 days  Other Orders: T-2 View CXR ([id number])  Patient Instructions: 1)  Prednisone x 12 days 2)  Nexium 40 mg Take  one 30-60 min before first meal of the day 3)  Pepcid 20 mg at bedtime 4)  Delsym 2 tsp every 12 hours as needed for cough 5)  Stop symbicort 6)  Please schedule a follow-up appointment in 2 weeks, sooner if needed  7)  GERD (REFLUX)  is a common cause of respiratory symptoms. It commonly presents without  heartburn and can be treated with medication, but also with lifestyle changes including avoidance of late meals, excessive alcohol, smoking cessation, and avoid fatty foods, chocolate, peppermint, colas, red wine, and acidic juices such as orange juice. NO MINT OR MENTHOL PRODUCTS SO NO COUGH DROPS  8)  USE SUGARLESS CANDY INSTEAD (jolley ranchers)  9)  NO OIL BASED VITAMINS  Prescriptions: PREDNISONE 10 MG  TABS (PREDNISONE) 4 each am x 3 days,  3 x 3 days, 2x3 days, and 1x3 days  #30 x 0   Entered and Authorized by:   Nyoka Cowden MD   Signed by:   Nyoka Cowden MD on 06/24/2009   Method used:   Electronically to        [facility]* (retail)       [address]       [place], [place]  [address]       Ph: [phone]       Fax: [phone]   RxID:   [id number] NEXIUM 40 MG  CPDR (ESOMEPRAZOLE MAGNESIUM) Take  one 30-60 min before first meal of the day  #30 x 2   Entered  and Authorized by:   Nyoka Cowden MD   Signed by:   Nyoka Cowden MD on 06/24/2009   Method used:   Electronically to        [facility]* (retail)       [address]       [place], [place]  [address]       Ph: [phone]       Fax: [phone]   RxID:   [id number]    CXR  Procedure date:  06/24/2009  Findings:      Findings: Cardiomediastinal silhouette is unremarkable.  No acute infiltrate or pleural effusion.  No pulmonary edema.  Bony thorax is unremarkable. Minimal hyperinflation.   IMPRESSION: No active disease. Mild hyperinflation.

## 2010-03-25 NOTE — Assessment & Plan Note (Signed)
Summary: Pulmonary/ext ov with hfa 25-75% effective   Copy to:  Dr. Dayton Martes Primary Provider/Referring Provider:  Dr. Dayton Martes  CC:  2 wk followup.  Pt states that cough had improved while on prednisone.  When he finished prednisone a few days ago cough started to come back.  Cough was prod this am with white/yellow sputum.  Marland Kitchen  History of Present Illness: 46 yowm never smoker no previous hx of allergies or asthma new onset recurrent bronchitis 2005 never completely better since then.  Jun 24, 2009 cc daily cough x 2005 maybe worse in spring and fall seems to peak after stirs around in am can become severe and lightheaded, also occ vomits from coughing.  ventolin may helps some but does not eliminate it.   prednisone helps some, just finished prednisone last month and much worse since stopped it.  minimal actual sputum production. assoc with mild chronic nasal congestion.  rec Prednisone x 12 days Nexium 40 mg Take  one 30-60 min before first meal of the day Pepcid 20 mg at bedtime Delsym 2 tsp every 12 hours as needed for cough Stop symbicort  Jul 08, 2009 2 wk followup.  Pt states that cough had improved while on prednisone.  When he finished prednisone a few days ago cough started to come back.  Cough was prod this am with white/yellow sputum.  This has occurred despite compliance with diet and ppi.  Pt denies any significant sore throat, dysphagia, itching, sneezing,  nasal congestion or excess secretions,  fever, chills, sweats, unintended wt loss, pleuritic or exertional cp, hempoptysis, change in activity tolerance  orthopnea pnd or leg swelling.  Pt also denies any obvious fluctuation in symptoms with weather or environmental change or other alleviating or aggravating factors.           Current Medications (verified): 1)  Allopurinol 300 Mg Tabs (Allopurinol) .... Take 1 Tablet By Mouth Once A Day 2)  Singulair 10 Mg Tabs (Montelukast Sodium) .Marland Kitchen.. 1 By Mouth Daily 3)  Protonix 40 Mg Tbec  (Pantoprazole Sodium) .... Take One By Mouth 30 To 60 Min Before First Meal 4)  Pepcid Ac Maximum Strength 20 Mg Tabs (Famotidine) .... One At Bedtime 5)  Tylenol Extra Strength 500 Mg Tabs (Acetaminophen) .... As Needed 6)  Ventolin Hfa 108 (90 Base) Mcg/act Aers (Albuterol Sulfate) .... 2 Puffs Inhaled Every 4-6 Hours As Needed Wheeze 7)  Flexeril 10 Mg  Tabs (Cyclobenzaprine Hcl) .... Take 1 Tablet By Mouth At Bedtime As Needed 8)  Delsym 30 Mg/66ml Lqcr (Dextromethorphan Polistirex) .Marland Kitchen.. 1 To 2 Tsp Every 12 Hours As Needed  Allergies (verified): 1)  ! * Codeine  Past History:  Past Medical History: Allergy w/u Dr Stevphen [place]     - Maybe trees > did not rec shots per pt Chronic cough     - onset 2005     - Allergy profile sent Jul 08, 2009      - Sinus Ct ordered Jul 08, 2009     - Qvar trial Jul 08, 2009   Vital Signs:  Patient profile:   50 year old male Weight:      277 pounds O2 Sat:      96 % on Room air Temp:     97.6 degrees F oral Pulse rate:   90 / minute BP sitting:   116 / 84  (left arm) Cuff size:   large  Vitals Entered ByVernie Murders (Jul 08, 2009 10:48  AM)  O2 Flow:  Room air  Physical Exam  Additional Exam:  wt  294 Jun 24, 2009 > 277 Jul 08, 2009  amb obese wm with classic upper airway coughing HEENT: nl dentition, mod bilateral nonspecific swelling of  turbinates, and  nl orophanx. Nl external ear canals without cough reflex NECK :  without JVD/Nodes/TM/ nl carotid upstrokes bilaterally LUNGS: no acc muscle use, clear to A and P bilaterally without cough on insp or exp maneuvers CV:  RRR  no s3 or murmur or increase in P2, no edema  ABD:  soft and nontender with nl excursion in the supine position. No bruits or organomegaly, bowel sounds nl MS:  warm without deformities, calf tenderness, cyanosis or clubbing    Impression & Recommendations:  Problem # 1:  COUGH (ICD-786.2)  Steroid responsive and now flaring off steroids despite compliance  on acid suppression and diet.  Therefore cough variant asthma and eos bronchitis in ddx so try qvar then eval for sinus dz and allergy   I spent extra time with the patient today explaining optimal mdi  technique.  This improved from  25-75%   Each maintenance medication was reviewed in detail including most importantly the difference between maintenance prns and under what circumstances the prns are to be used.  In addition, these two groups (for which the patient should keep up with refills) were distinguished from a third group :  meds that are used only short term with the intent to complete a course of therapy and then not refill them.  The med list was then fully reconciled and reorganized to reflect this important distinction.   Medications Added to Medication List This Visit: 1)  Qvar 80 Mcg/act Aers (Beclomethasone dipropionate) .... 2 puffs first thing  in am and 2 puffs again in pm about 12 hours later 2)  Delsym 30 Mg/41ml Lqcr (Dextromethorphan polistirex) .Marland Kitchen.. 1 to 2 tsp every 12 hours as needed 3)  Prednisone 10 Mg Tabs (Prednisone) .... 4 each am x 2days, 2x2days, 1x2days and stop  Other Orders: T-Allergy Profile [facility] [place], [place], [place], [place], [place] [id number]) Misc. Referral (Misc. Ref) Est. Patient Level IV (960[address])  Patient Instructions: 1)  Qvar 80 2 puffs first thing  in am and 2 puffs again in pm about 12 hours later  2)  Work on inhaler technique:  relax and blow all the way out then take a nice smooth deep breath back in, triggering the inhaler at same time you start breathing in hold a few seconds 3)  Continue protonix Take  one 30-60 min before first meal of the day and pepcid at bedtime 4)  GERD (REFLUX)  is a common cause of respiratory symptoms. It commonly presents without heartburn and can be treated with medication, but also with lifestyle changes including avoidance of late meals, excessive alcohol, smoking cessation, and avoid fatty foods, chocolate, peppermint, colas, red  wine, and acidic juices such as orange juice. NO MINT OR MENTHOL PRODUCTS SO NO COUGH DROPS  5)  USE SUGARLESS CANDY INSTEAD (jolley ranchers)  6)  NO OIL BASED VITAMINS  7)  Please schedule a follow-up appointment in 3  weeks, sooner if needed  8)  Prednisone .x 6 days only if needed Prescriptions: PREDNISONE 10 MG  TABS (PREDNISONE) 4 each am x 2days, 2x2days, 1x2days and stop  #14 x 0   Entered and Authorized by:   Nyoka Cowden MD   Signed by:   Nyoka Cowden  MD on 07/08/2009   Method used:   Electronically to        [facility]* (retail)       [address]       [place], [place]  [address]       Ph: [phone]       Fax: [phone]   RxID:   [id number]

## 2010-03-25 NOTE — Assessment & Plan Note (Signed)
Summary: 10:15 COUGH,CONGESTION/CLE   Vital Signs:  Patient profile:   50 year old male Height:      71.5 inches Weight:      286.0 pounds BMI:     39.48 O2 Sat:      96 % Temp:     98.4 degrees F oral Pulse rate:   80 / minute Pulse rhythm:   regular Resp:     18 per minute BP sitting:   120 / 76  (left arm) Cuff size:   large  Vitals Entered By: Benny Lennert CMA Duncan Dull) (April 26, 2009 10:51 AM)  History of Present Illness: Chief complaint cough and congestion  Acute Visit History:      The patient complains of cough, fever, and nasal discharge.  These symptoms began 2 days ago.  He denies chest pain, earache, sinus problems, and sore throat.  Other comments include: History of bronchitis in past. Also has allergies.  Using Symbicort/Claritin daily.  Does not have albuterol inhaler.        His highest temperature has been subjective.        The patient notes wheezing.  The character of the cough is described as nonproductive.  There is no history of shortness of breath associated with his cough.        Problems Prior to Update: 1)  Chest Pain Unspecified  (ICD-786.50) 2)  Extrinsic Asthma, Unspecified (DR [place])  (ICD-493.00) 3)  Sleep Disorder  (ICD-780.50) 4)  Elevated Blood Pressure Without Diagnosis of Hypertension  (ICD-796.2) 5)  Hemorrhoids, Hx of  (ICD-V12.79) 6)  Gout  (ICD-274.9) 7)  Allergy, Environmental  (ICD-995.3)  Current Medications (verified): 1)  Allopurinol 300 Mg Tabs (Allopurinol) .... Take 1 Tablet By Mouth Once A Day 2)  Flexeril 10 Mg  Tabs (Cyclobenzaprine Hcl) .... Take 1 Tablet By Mouth At Bedtime 3)  Claritin 10 Mg  Tabs (Loratadine) .... Take 1 Tablet By Mouth Once A Day 4)  Symbicort 80-4.5 Mcg/act Aero (Budesonide-Formoterol Fumarate) .... Use 2 Puffs Every Am As Needed  Allergies: 1)  ! * Codeine  Past History:  Past medical, surgical, family and social histories (including risk factors) reviewed, and no changes noted (except as  noted below).  Past Surgical History: Reviewed history from 03/07/2008 and no changes required. LEFT FOOT OSTEOPHYE REMOVAL :(DR. August Saucer) :(09/03/2003) SEPTOPLASTY 21YOA  Family History: Reviewed history from 03/07/2008 and no changes required. Father: A  HTN Mother: A BROTHER A SISTER A CV: NEGATIVE HBP: POSITIVE FATHER DM: POSITIVE  MGM GOUT/ARTHRITIS: PROSTATE CANCER: POSITIVE LEUKEMIA PGF BREAST/OVARIAN/ UTERINE CANCER: COLON CANCER: DEPRESSION: NEGATIVE ETOH AND DRUG ABUSE: NEGATIVE OTHER: NEGATIVE STROKE  Social History: Reviewed history from 03/07/2008 and no changes required. Marital Status: Married  LIVES WITH WIFE Children: 2 CHILDREN Occupation: COMPUTER PROGRAMER  Review of Systems General:  Complains of fatigue; denies fever. CV:  Denies chest pain or discomfort. GI:  Denies abdominal pain.  Physical Exam  General:  morbidly obese male in NAD Head:  no maxillary sinus ttp Ears:  clear fluid B TMS  Nose:  nasal discharge, mucosal pallor.   Mouth:  MMMpharynx pink and moist, no exudate  Neck:  no carotid bruit or thyromegaly no cervical or supraclavicular lymphadenopathy  Lungs:  Normal respiratory effort, chest expands symmetrically. Lungs are clear to auscultation, no crackles or wheezes. Heart:  Normal rate and regular rhythm. S1 and S2 normal without gallop, murmur, click, rub or other extra sounds.   Impression & Recommendations:  Problem #  1:  ASTHMA, EXTRINSIC, WITH ACUTE EXACERBATION (ICD-493.02) Needs prescription for albuterol rescue. Significantly decrease peak flows. Will treate with steroid taper. No clear bacterial source at this time. Likely allergy/viral trigger. Follow up if not improvign. Go to ER if severe SOB.  His updated medication list for this problem includes:    Symbicort 80-4.5 Mcg/act Aero (Budesonide-formoterol fumarate) ..... Use 2 puffs every am as needed    Prednisone 20 Mg Tabs (Prednisone) .Marland KitchenMarland KitchenMarland KitchenMarland Kitchen 3 tabs by mouth daily x 3  days, then 2 tabs by mouth daily x 2 days then 1 tab by mouth daily x 2 days    Ventolin Hfa 108 (90 Base) Mcg/act Aers (Albuterol sulfate) .Marland Kitchen... 2 puffs inhaled every 4-6 hours as needed wheeze  Complete Medication List: 1)  Allopurinol 300 Mg Tabs (Allopurinol) .... Take 1 tablet by mouth once a day 2)  Flexeril 10 Mg Tabs (Cyclobenzaprine hcl) .... Take 1 tablet by mouth at bedtime 3)  Claritin 10 Mg Tabs (Loratadine) .... Take 1 tablet by mouth once a day 4)  Symbicort 80-4.5 Mcg/act Aero (Budesonide-formoterol fumarate) .... Use 2 puffs every am as needed 5)  Prednisone 20 Mg Tabs (Prednisone) .... 3 tabs by mouth daily x 3 days, then 2 tabs by mouth daily x 2 days then 1 tab by mouth daily x 2 days 6)  Ventolin Hfa 108 (90 Base) Mcg/act Aers (Albuterol sulfate) .... 2 puffs inhaled every 4-6 hours as needed wheeze   Patient Instructions: 1)  Prednisone taper. 2)  Change Claritin to Zyrtec.  3)  Use albuterol every 4-6 hpours as needed for rescue.  4)  No clear bacterial source at this time. Likely allergy/viral trigger. Follow up if not improving. Go to ER if severe shortness of breath.   5)  Follow up early next week with Dr. Hetty Ely to ensure improvement.  Prescriptions: VENTOLIN HFA 108 (90 BASE) MCG/ACT AERS (ALBUTEROL SULFATE) 2 puffs inhaled every 4-6 hours as needed wheeze  #1 x 2   Entered and Authorized by:   Kerby Nora MD   Signed by:   Kerby Nora MD on 04/26/2009   Method used:   Electronically to        [facility]* (retail)       [address]       [place], [place]  [address]       Ph: [phone]       Fax: [phone]   RxID:   [id number] PREDNISONE 20 MG TABS (PREDNISONE) 3 tabs by mouth daily x 3 days, then 2 tabs by mouth daily x 2 days then 1 tab by mouth daily x 2 days  #15 x 0   Entered and Authorized by:   Kerby Nora MD   Signed by:   Kerby Nora MD on 04/26/2009   Method used:   Electronically to        [facility]* (retail)        [address]       [place], [place]  [address]       Ph: [phone]       Fax: [phone]   RxID:   [id number]   Current Allergies (reviewed today): ! * CODEINE

## 2010-03-25 NOTE — Letter (Signed)
Summary: Nadara Eaton letter  [facility]  [address] [place], [place] [address]   Phone: [phone]  Fax: [phone]       10/01/2009 MRN: [id number]  BRAELIN COSTLOW [address] [place], [place]  [address]  Dear Mr. KALL,  [facility], and [facility] announce the retirement of Arta Silence, M.D., from full-time practice at the [facility] office effective August 22, 2009 and his plans of returning part-time.  It is important to Dr. Hetty Ely and to our practice that you understand that [facility] has seven physicians in our office for your health care needs.  We will continue to offer the same exceptional care that you have today.    Dr. Hetty Ely has spoken to many of you about his plans for retirement and returning part-time in the fall.   We will continue to work with you through the transition to schedule appointments for you in the office and meet the high standards that [place] is committed to.   Again, it is with great pleasure that we share the news that Dr. Hetty Ely will return to [facility] at [facility] in October of 2011 with a reduced schedule.    If you have any questions, or would like to request an appointment with one of our physicians, please call us at [phone] and press the option for Scheduling an appointment.  We take pleasure in providing you with excellent patient care and look forward to seeing you at your next office visit.  Our [facility] Physicians are:  Tillman Abide, M.D. Laurita Quint, M.D. Roxy Manns, M.D. Kerby Nora, M.D. Hannah Beat, M.D. Ruthe Mannan, M.D. We proudly welcomed Raechel Ache, M.D. and Eustaquio Boyden, M.D. to the practice in July/August 2011.  Sincerely,  [facility]

## 2010-03-25 NOTE — Miscellaneous (Signed)
Summary: [facility]/Dr. Corinda Gubler   Imported By: Eleonore Chiquito 10/03/2007 13:02:31  _____________________________________________________________________  External Attachment:    Type:   Image     Comment:   External Document

## 2010-03-25 NOTE — Progress Notes (Signed)
Summary: Referral to Pulmonology  Phone Note Call from Patient Call back at [phone] husband's cell   Caller: Spouse/[name] Call For: Richard Leeks MD Summary of Call: Patient is not any better, seen on last Thursday by Dr. Dayton Martes.  Would like a referral to Pulmonology, Dr. Shelle Iron.  Says Dr. Dayton Martes told him to call back if he was no better and she will refer him to Dr. Shelle Iron.  Patient's spouse advised that Dr. Dayton Martes will not be back in the office until tomorrow.  She is ok with that. Initial call taken by: Linde Gillis CMA Duncan Dull),  June 17, 2009 8:22 AM     Appended Document: Referral to Pulmonology Pulumonary referral in process.[email or web address]     Appended Document: Referral to Pulmonology Patient called asking how the status of his referral looked, I let him know that it is being handled.  Melody Comas  June 19, 2009 4:14 PM

## 2010-03-25 NOTE — Consult Note (Signed)
Summary: [facility]/New Patient Evaluation/Dr.   [facility]/New Patient Evaluation/Dr. Kerry Kass   Imported By: Mickle Asper 10/14/2007 16:22:19  _____________________________________________________________________  External Attachment:    Type:   Image     Comment:   External Document

## 2010-03-25 NOTE — Assessment & Plan Note (Signed)
Summary: BRONCITIAS/[facility]   Vital Signs:  Patient Profile:   50 Years Old Male Weight:      257 pounds Temp:     97.3 degrees F oral Pulse rate:   76 / minute Pulse rhythm:   regular BP sitting:   110 / 70  (left arm) Cuff size:   large  Vitals Entered By: Providence Crosby (July 25, 2007 12:19 PM)                 Chief Complaint:  ? bronchitis .  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  Worse in the spring and in the fall.    Prior Medications Reviewed Using: Patient Recall  Current Allergies (reviewed today): ! * CODEINE        Complete Medication List: 1)  Allopurinol 300 Mg Tabs (Allopurinol) .... Take 1 tablet by mouth once a day 2)  Flexeril 10 Mg Tabs (Cyclobenzaprine hcl) .... Take 1 tablet by mouth at bedtime 3)  Claritin 10 Mg Tabs (Loratadine) .... Take 1 tablet by mouth once a day   Patient Instructions: 1)  RTC 2 weeks 2)  Change Claritin to Zyrtec 10mg  at night 3)  Use Flonase one inh once a day 4)  Use Advair one inh twice a day 5)  Use Astepro two inh twice a day 6)  Use Optivar one drop once a day 7)  GUAIFENESIN  600mg  by mouth AM and NOON   8)    [facility] MUCOUS RELIEF EXPECTORANT (400 mg) 11/2 TABS  9)  Drink Lots of Fluids.                     ]  Medication Administration  Injection # 1:    Medication: Dexamethasone 1mg     Diagnosis: BRONCHITIS-ACUTE (ICD-466.0)    Route: IM    Site: RUOQ gluteus    Exp Date: 11/2007    Lot #: [id number]    Mfr: [facility]    Comments: 10mg  per ml    Patient tolerated injection without complications    Given by: Burna Mortimer Combs (July 25, 2007 1:17 PM)  Orders Added: 1)  Dexamethasone 1mg  [J1100] 2)  Admin of Therapeutic Inj  intramuscular or subcutaneous [96372]   Appended Document: BRONCITIAS/[facility]        Current Allergies: ! * CODEINE       Physical Exam  General:     Well-developed,well-nourished,in no acute distress; alert,appropriate and cooperative throughout examination, congested. Head:     Normocephalic and atraumatic without obvious abnormalities. No apparent alopecia or balding. Sinuses mildly tender. Eyes:     Conjunctiva inflamed, palpebral > bulbar. Ears:     External ear exam shows no significant lesions or deformities.  Otoscopic examination reveals clear canals, tympanic membranes are intact bilaterally without bulging, retraction, inflammation or discharge. Hearing is grossly normal bilaterally. Nose:     Inflamed with clear discharge. Mouth:     Oral mucosa and oropharynx without lesions or exudates.  Teeth in good repair. Mild PND. Neck:     No deformities, masses, or tenderness noted. Chest Wall:     No deformities, masses, tenderness or gynecomastia noted. Lungs:     Normal respiratory effort, chest expands symmetrically. Lungs are clear to auscultation, no crackles or wheezes. Heart:     Normal rate  and regular rhythm. S1 and S2 normal without gallop, murmur, click, rub or other extra sounds.    Impression & Recommendations:  Problem # 1:  ALLERGY, ENVIRONMENTAL (ICD-995.3) Assessment: New 2 Nasal inhalers, 1 oral inhaler, Zyrtec and steroid shot. See instructions.   Complete Medication List: 1)  Allopurinol 300 Mg Tabs (Allopurinol) .... Take 1 tablet by mouth once a day 2)  Flexeril 10 Mg Tabs (Cyclobenzaprine hcl) .... Take 1 tablet by mouth at bedtime 3)  Claritin 10 Mg Tabs (Loratadine) .... Take 1 tablet by mouth once a day   Patient Instructions: 1)  25 mins spent with pt.   ]

## 2010-03-25 NOTE — Assessment & Plan Note (Signed)
Summary: COUGH,COLD/CLE   Vital Signs:  Patient Profile:   50 Years Old Male Weight:      270 pounds Temp:     99.0 degrees F oral Pulse rate:   100 / minute BP sitting:   128 / 97  (left arm) Cuff size:   large  Vitals Entered By: Cooper Render (May 20, 2007 10:58 AM)                 Chief Complaint:  URI sx.  History of Present Illness: Here for URI sx--onset x 1wk with cough--thick green to white material, wakes with cough--sleeping iln recliner, some nasal congestion.  Taking AlkaSeltzer cough and cold.---helps some.    Current Allergies (reviewed today): No known allergies     Risk Factors:  Tobacco use:  never Passive smoke exposure:  no Drug use:  no HIV high-risk behavior:  no   Review of Systems      See HPI   Physical Exam  General:     alert, well-developed, well-nourished, well-hydrated, and overweight-appearing.  NAD Eyes:     pupils equal and pupils round.   Ears:     TMs retracted with increased fluid Nose:     mucosal erythema and mucosal edema.  sinuses +,- Mouth:     no exudates and pharyngeal erythema.   Lungs:     moist harsh cough, no crackles and no wheezes.   Neurologic:     alert & oriented X3 and gait normal.   Skin:     turgor normal, color normal, and no rashes.   Cervical Nodes:     no anterior cervical adenopathy and no posterior cervical adenopathy.   Psych:     normally interactive and good eye contact.      Impression & Recommendations:  Problem # 1:  BRONCHITIS-ACUTE (ICD-466.0) Assessment: New continue comfort care measures: increase po fluids, rest, tylenol or IBP as needed will start augmentin x7d will use Hycodan at hs for cough, Delsym during the daytime as needed see back 5-7d if not improved His updated medication list for this problem includes:    Augmentin 500-125 Mg Tabs (Amoxicillin-pot clavulanate) .Marland Kitchen... 1 two times a day    Hycodan 5-1.5 Mg/73ml Syrp (Hydrocodone-homatropine) .Marland Kitchen... 1 tsp at  bedtime as needed cough by mouth   Complete Medication List: 1)  Allopurinol 300 Mg Tabs (Allopurinol) .... Take 1 tablet by mouth once a day 2)  Flexeril 10 Mg Tabs (Cyclobenzaprine hcl) .... Take 1 tablet by mouth at bedtime 3)  Claritin 10 Mg Tabs (Loratadine) .... Take 1 tablet by mouth once a day 4)  Augmentin 500-125 Mg Tabs (Amoxicillin-pot clavulanate) .Marland Kitchen.. 1 two times a day 5)  Hycodan 5-1.5 Mg/2ml Syrp (Hydrocodone-homatropine) .Marland Kitchen.. 1 tsp at bedtime as needed cough by mouth     Prescriptions: HYCODAN 5-1.5 MG/5ML  SYRP (HYDROCODONE-HOMATROPINE) 1 tsp at bedtime as needed cough by mouth  #118ml x 0   Entered and Authorized by:   Gildardo Griffes FNP   Signed by:   Gildardo Griffes FNP on 05/20/2007   Method used:   Print then Give to Patient   RxID:   [id number] AUGMENTIN 500-125 MG  TABS (AMOXICILLIN-POT CLAVULANATE) 1 two times a day  #20 x 0   Entered and Authorized by:   Gildardo Griffes FNP   Signed by:   Gildardo Griffes FNP on 05/20/2007   Method used:   Print then Give to Patient   RxID:  [id number]  ] Prior Medications (reviewed today): ALLOPURINOL 300 MG TABS (ALLOPURINOL) Take 1 tablet by mouth once a day FLEXERIL 10 MG  TABS (CYCLOBENZAPRINE HCL) Take 1 tablet by mouth at bedtime CLARITIN 10 MG  TABS (LORATADINE) Take 1 tablet by mouth once a day AUGMENTIN 500-125 MG  TABS (AMOXICILLIN-POT CLAVULANATE) 1 two times a day HYCODAN 5-1.5 MG/5ML  SYRP (HYDROCODONE-HOMATROPINE) 1 tsp at bedtime as needed cough by mouth Current Allergies (reviewed today): No known allergies

## 2010-03-25 NOTE — Assessment & Plan Note (Signed)
Summary: Pulmonary/ final f/u ov   Copy to:  Dr. Dayton Martes Primary Provider/Referring Provider:  Dr. Dayton Martes  CC:  6 wk followup.  Pt states doing well.  Has had no cough.  No complaints today.Marland Kitchen  History of Present Illness: 50 yowm never smoker no previous hx of allergies or asthma new onset recurrent bronchitis 2005 never completely better since then.  Jun 24, 2009 cc daily cough x 2005 maybe worse in spring and fall seems to peak after stirs around in am can become severe and lightheaded, also occ vomits from coughing.  ventolin may helps some but does not eliminate it.   prednisone helps some, just finished prednisone last month and much worse since stopped it.  minimal actual sputum production. assoc with mild chronic nasal congestion.  rec Prednisone x 12 days Nexium 40 mg Take  one 30-60 min before first meal of the day Pepcid 20 mg at bedtime Delsym 2 tsp every 12 hours as needed for cough Stop symbicort  Jul 08, 2009 2 wk followup.  Pt states that cough had improved while on prednisone.  When he finished prednisone a few days ago cough started to come back.  Cough was prod this am with white/yellow sputum.  This has occurred despite compliance with diet and ppi.   rec add qvar 80 2 bid   July 30, 2009 3 wk followup.  Pt states that his cough has resolved.  No complaints today.  able to do yard work now.  rec taper off ppi rx and continue qvar but taper to 40 2 puffs first thing  in am and 2 puffs again in pm about 12 hours later   September 10, 2009 ov 6 wk followup.  Pt states doing well.  Has had no cough.  No complaints today. following diet and not on any acid suppression x one week with no flare.  Pt denies any significant sore throat, dysphagia, itching, sneezing,  nasal congestion or excess secretions,  fever, chills, sweats, unintended wt loss, pleuritic or exertional cp, hempoptysis, change in activity tolerance  orthopnea pnd or leg swelling.    Current Medications (verified): 1)   Allopurinol 300 Mg Tabs (Allopurinol) .... Take 1 Tablet By Mouth Once A Day 2)  Singulair 10 Mg Tabs (Montelukast Sodium) .Marland Kitchen.. 1 By Mouth Daily 3)  Qvar 80 Mcg/act Aers (Beclomethasone Dipropionate) .... 2 Puffs First Thing  in Am and 2 Puffs Again in Pm About 12 Hours Later 4)  Tylenol Extra Strength 500 Mg Tabs (Acetaminophen) .... As Needed 5)  Ventolin Hfa 108 (90 Base) Mcg/act Aers (Albuterol Sulfate) .... 2 Puffs Inhaled Every 4-6 Hours As Needed Wheeze 6)  Flexeril 10 Mg  Tabs (Cyclobenzaprine Hcl) .... Take 1 Tablet By Mouth At Bedtime As Needed 7)  Delsym 30 Mg/49ml Lqcr (Dextromethorphan Polistirex) .Marland Kitchen.. 1 To 2 Tsp Every 12 Hours As Needed  Allergies (verified): 1)  ! * Codeine  Past History:  Past Medical History: Allergy w/u Dr Stevphen [place]     - Maybe trees > did not rec shots per pt Chronic cough     - onset 2005 > resolved on ICS September 10, 2009      - Allergy profile sent Jul 08, 2009 >> neg      - Sinus Ct ordered Jul 08, 2009 >> neg     - Qvar trial Jul 08, 2009 >> much better July 30, 2009 with 90% effective hfa  Vital Signs:  Patient profile:  50 year old male Weight:      275 pounds O2 Sat:      97 % on Room air Temp:     98.0 degrees F oral Pulse rate:   103 / minute BP sitting:   110 / 84  (left arm) Cuff size:   large  Vitals Entered By: Vernie Murders (September 10, 2009 4:36 PM)  O2 Flow:  Room air  Physical Exam  Additional Exam:  wt 294 Jun 24, 2009 > 277 Jul 08, 2009 > 275 July 30, 2009 > 275 September 10, 2009  amb obese wm with classic upper airway coughing HEENT: nl dentition, mod bilateral nonspecific swelling of  turbinates, and  nl orophanx. Nl external ear canals without cough reflex NECK :  without JVD/Nodes/TM/ nl carotid upstrokes bilaterally LUNGS: no acc muscle use, clear to A and P bilaterally without cough on insp or exp maneuvers CV:  RRR  no s3 or murmur or increase in P2, no edema  ABD:  soft and nontender with nl excursion in the supine  position. 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     Each maintenance medication was reviewed in detail including most importantly the difference between maintenance and as needed and under what circumstances the prns are to be used. See instructions for specific recommendations   Orders: Est. Patient Level III (16109)  Patient Instructions: 1)  Try qvar 40 2 puffs first thing  in am and 2 puffs again in pm about 12 hours later in about 2 weeks 2)  Please schedule a follow-up appointment as needed.

## 2010-05-24 ENCOUNTER — Encounter: Payer: Self-pay | Admitting: Family Medicine

## 2010-05-24 DIAGNOSIS — T7840XA Allergy, unspecified, initial encounter: Secondary | ICD-10-CM | POA: Insufficient documentation

## 2010-05-27 ENCOUNTER — Other Ambulatory Visit: Payer: Self-pay | Admitting: *Deleted

## 2010-05-27 MED ORDER — ALLOPURINOL 300 MG PO TABS: 300.0000 mg | ORAL_TABLET | Freq: Every day | ORAL | Status: DC

## 2010-06-12 ENCOUNTER — Encounter: Payer: Self-pay | Admitting: Family Medicine

## 2010-06-12 ENCOUNTER — Ambulatory Visit (INDEPENDENT_AMBULATORY_CARE_PROVIDER_SITE_OTHER): Payer: PRIVATE HEALTH INSURANCE | Admitting: Family Medicine

## 2010-06-12 DIAGNOSIS — L03319 Cellulitis of trunk, unspecified: Secondary | ICD-10-CM

## 2010-06-12 DIAGNOSIS — L0201 Cutaneous abscess of face: Secondary | ICD-10-CM | POA: Insufficient documentation

## 2010-06-12 DIAGNOSIS — L0291 Cutaneous abscess, unspecified: Secondary | ICD-10-CM

## 2010-06-12 DIAGNOSIS — L02219 Cutaneous abscess of trunk, unspecified: Secondary | ICD-10-CM

## 2010-06-12 NOTE — Assessment & Plan Note (Signed)
New. Resolving. Reassurance provided- likely resolving tissue from abscess, anticipate weeks before it resolves. The patient indicates understanding of these issues and agrees with the plan.

## 2010-06-12 NOTE — Progress Notes (Signed)
50 yo here for firm subcutaneous lump on his abdomen.  Noticed it over a week ago, it was red, warm and draining pus at the time. No longer hurts, all other symptoms have resolved but still feels the firmness.  Has had boils in past but never had this firm area underneath.  No fevers or chills.  The PMH, PSH, Social History, Family History, Medications, and allergies have been reviewed in [facility], and have been updated if relevant.  ROS:  See HPI Gen: no fevers or chills. Skin: no erythema, warmth or drainage currently.  Physical exam:  BP 110/80  Pulse 88  Temp(Src) 98.7 F (37.1 C) (Oral)  Ht 6' (1.829 m)  Wt 269 lb (122.018 kg)  BMI 36.48 kg/m2  Gen:  Pleasant, overweight male in NAD. Skin:  Firm 2 cm linear lump under skin 2 inches above naval.

## 2010-08-01 ENCOUNTER — Other Ambulatory Visit: Payer: Self-pay | Admitting: *Deleted

## 2010-08-01 MED ORDER — ALLOPURINOL 300 MG PO TABS: 300.0000 mg | ORAL_TABLET | Freq: Every day | ORAL | Status: DC

## 2010-08-04 ENCOUNTER — Other Ambulatory Visit: Payer: Self-pay | Admitting: *Deleted

## 2010-08-04 MED ORDER — BECLOMETHASONE DIPROPIONATE 80 MCG/ACT IN AERS: 2.0000 | INHALATION_SPRAY | Freq: Two times a day (BID) | RESPIRATORY_TRACT | Status: DC

## 2010-10-07 ENCOUNTER — Other Ambulatory Visit: Payer: Self-pay | Admitting: *Deleted

## 2010-10-07 ENCOUNTER — Other Ambulatory Visit: Payer: Self-pay | Admitting: Internal Medicine

## 2010-10-07 MED ORDER — ALLOPURINOL 300 MG PO TABS: 300.0000 mg | ORAL_TABLET | Freq: Every day | ORAL | Status: DC

## 2010-10-07 MED ORDER — BECLOMETHASONE DIPROPIONATE 80 MCG/ACT IN AERS: 2.0000 | INHALATION_SPRAY | Freq: Two times a day (BID) | RESPIRATORY_TRACT | Status: DC

## 2010-10-07 NOTE — Telephone Encounter (Signed)
Received refill request from [facility] for qvar.  Final pulmonary ov w/ MW was 08/2009 and pt told to follow up as needed.  Per protocol, PCP to fill this med and pt to follow up if needed for breathing.  #1 with no refills given.

## 2010-12-08 ENCOUNTER — Telehealth: Payer: Self-pay | Admitting: Internal Medicine

## 2010-12-08 NOTE — Telephone Encounter (Signed)
Pt states he is scheduled to see MW on 10/19 at 12:00 to see MW to get refills on his medications. Pt states he will address this with MW when he comes in. Pt needed nothing further

## 2010-12-12 ENCOUNTER — Ambulatory Visit (INDEPENDENT_AMBULATORY_CARE_PROVIDER_SITE_OTHER): Payer: Managed Care, Other (non HMO) | Admitting: Internal Medicine

## 2010-12-12 ENCOUNTER — Encounter: Payer: Self-pay | Admitting: Internal Medicine

## 2010-12-12 DIAGNOSIS — R05 Cough: Secondary | ICD-10-CM

## 2010-12-12 MED ORDER — BECLOMETHASONE DIPROPIONATE 80 MCG/ACT IN AERS: INHALATION_SPRAY | RESPIRATORY_TRACT | Status: DC

## 2010-12-12 MED ORDER — PREDNISONE (PAK) 10 MG PO TABS: ORAL_TABLET | ORAL | Status: AC

## 2010-12-12 NOTE — Patient Instructions (Signed)
Continue qvar 80 Take 2 puffs first thing in am and then another 2 puffs about 12 hours later.   Try off singulair after the first frost to see if it make any difference in the cough or sinus problems   If you are satisfied with your treatment plan let your doctor know and he/she can either refill your medications or you can return here when your prescription runs out.     If in any way you are not 100% satisfied,  please tell us.  If 100% better, tell your friends!

## 2010-12-13 NOTE — Assessment & Plan Note (Signed)
In retrospect he has clear evidence of cough variant asthma.  All goals of chronic asthma control met including optimal function and elimination of symptoms with no need for rescue therapy.  Contingencies discussed in full including contacting this office immediately if not controlling the symptoms  On present rx  Probably doesn't need year round singulair since addition of qvar was so effective in controlling his symptoms > ok to try off p first frost since reports symptoms tend to flare in early fall and have not done so this year on the combination of singulair and qvar (but I strongly suspect by his response that qvar is the more important rx)

## 2010-12-13 NOTE — Progress Notes (Signed)
Subjective:     Patient ID: Richard Giles, male   DOB: December 04, 1960, 50 y.o.   MRN: [id number]  HPI  28 yowm never smoker no previous hx of allergies or asthma new onset recurrent bronchitis 2005 never completely better since then.   Jun 24, 2009 cc daily cough x 2005 maybe worse in spring and fall seems to peak after stirs around in am can become severe and lightheaded, also occ vomits from coughing. ventolin may helps some but does not eliminate it. prednisone helps some, just finished prednisone last month and much worse since stopped it. minimal actual sputum production. assoc with mild chronic nasal congestion. rec Prednisone x 12 days  Nexium 40 mg Take one 30-60 min before first meal of the day  Pepcid 20 mg at bedtime  Delsym 2 tsp every 12 hours as needed for cough  Stop symbicort   Jul 08, 2009 2 wk followup. Pt states that cough had improved while on prednisone. When he finished prednisone a few days ago cough started to come back. Cough was prod this am with white/yellow sputum. This has occurred despite compliance with diet and ppi. rec add qvar 80 2 bid   July 30, 2009 3 wk followup. Pt states that his cough has resolved.> rec  no change rx  12/12/10 ov/ Richard Giles cc cough resolved completely on qvar and wants it refilled,   No doe  Sleeping ok without nocturnal  or early am exacerbation  of respiratory  c/o's or need for noct saba. Also denies any obvious fluctuation of symptoms with weather or environmental changes or other aggravating or alleviating factors except as outlined above   ROS  At present neg for  any significant sore throat, dysphagia, itching, sneezing,  nasal congestion or excess/ purulent secretions,  fever, chills, sweats, unintended wt loss, pleuritic or exertional cp, hempoptysis, orthopnea pnd or leg swelling.  Also denies presyncope, palpitations, heartburn, abdominal pain, nausea, vomiting, diarrhea  or change in bowel or urinary habits, dysuria,hematuria,  rash,  arthralgias, visual complaints, headache, numbness weakness or ataxia.      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 Dr Stevphen [place]  - Maybe trees > did not rec shots per pt  Chronic cough  - onset 2005 > resolved on ICS September 10, 2009  - Allergy profile sent Jul 08, 2009 >> neg  - Sinus Ct ordered Jul 08, 2009 >> neg  - Qvar trial Jul 08, 2009 >> much better July 30, 2009 with 90% effective hfa  Review of Systems     Objective:   Physical Exam Obese wm nad  Wt 276 12/12/10  HEENT: nl dentition, turbinates, and orophanx. Nl external ear canals without cough reflex   NECK :  without JVD/Nodes/TM/ nl carotid upstrokes bilaterally   LUNGS: no acc muscle use, clear to A and P bilaterally without cough on insp or exp maneuvers   CV:  RRR  no s3 or murmur or increase in P2, no edema   ABD:  soft and nontender with nl excursion in the supine position. No bruits or organomegaly, bowel sounds nl  MS:  warm without deformities, calf tenderness, cyanosis or clubbing  SKIN: warm and dry without lesions    NEURO:  alert, approp, no deficits      Assessment:         Plan:

## 2010-12-16 ENCOUNTER — Other Ambulatory Visit: Payer: Self-pay | Admitting: *Deleted

## 2010-12-16 MED ORDER — ALLOPURINOL 300 MG PO TABS: 300.0000 mg | ORAL_TABLET | Freq: Every day | ORAL | Status: DC

## 2011-01-19 ENCOUNTER — Other Ambulatory Visit: Payer: Self-pay | Admitting: *Deleted

## 2011-01-19 MED ORDER — ALLOPURINOL 300 MG PO TABS: 300.0000 mg | ORAL_TABLET | Freq: Every day | ORAL | Status: DC

## 2011-01-19 NOTE — Telephone Encounter (Signed)
Rx sent electronically to pharmacy. 

## 2011-01-19 NOTE — Telephone Encounter (Signed)
Received faxed refill request from pharmacy. Is it okay to refill medication? 

## 2011-02-10 ENCOUNTER — Other Ambulatory Visit: Payer: Self-pay | Admitting: Internal Medicine

## 2011-02-10 MED ORDER — BECLOMETHASONE DIPROPIONATE 80 MCG/ACT IN AERS: INHALATION_SPRAY | RESPIRATORY_TRACT | Status: DC

## 2011-02-12 ENCOUNTER — Other Ambulatory Visit: Payer: Self-pay | Admitting: *Deleted

## 2011-02-12 MED ORDER — CYCLOBENZAPRINE HCL 10 MG PO TABS: 10.0000 mg | ORAL_TABLET | Freq: Every evening | ORAL | Status: DC | PRN

## 2011-02-13 ENCOUNTER — Other Ambulatory Visit: Payer: Self-pay | Admitting: *Deleted

## 2011-02-13 MED ORDER — ALLOPURINOL 300 MG PO TABS: 300.0000 mg | ORAL_TABLET | Freq: Every day | ORAL | Status: DC

## 2011-04-20 ENCOUNTER — Other Ambulatory Visit: Payer: Self-pay | Admitting: *Deleted

## 2011-04-20 MED ORDER — ALLOPURINOL 300 MG PO TABS: 300.0000 mg | ORAL_TABLET | Freq: Every day | ORAL | Status: DC

## 2011-06-25 ENCOUNTER — Ambulatory Visit (INDEPENDENT_AMBULATORY_CARE_PROVIDER_SITE_OTHER): Payer: Managed Care, Other (non HMO) | Admitting: Family Medicine

## 2011-06-25 ENCOUNTER — Encounter: Payer: Self-pay | Admitting: Neurology

## 2011-06-25 ENCOUNTER — Telehealth: Payer: Self-pay

## 2011-06-25 ENCOUNTER — Encounter: Payer: Self-pay | Admitting: Family Medicine

## 2011-06-25 ENCOUNTER — Encounter: Payer: Self-pay | Admitting: Internal Medicine

## 2011-06-25 VITALS — BP 140/90 | HR 72 | Temp 97.8°F | Ht 71.0 in | Wt 288.0 lb

## 2011-06-25 DIAGNOSIS — Z23 Encounter for immunization: Secondary | ICD-10-CM

## 2011-06-25 DIAGNOSIS — R29818 Other symptoms and signs involving the nervous system: Secondary | ICD-10-CM

## 2011-06-25 DIAGNOSIS — R2990 Unspecified symptoms and signs involving the nervous system: Secondary | ICD-10-CM | POA: Insufficient documentation

## 2011-06-25 DIAGNOSIS — Z125 Encounter for screening for malignant neoplasm of prostate: Secondary | ICD-10-CM

## 2011-06-25 DIAGNOSIS — Z0000 Encounter for general adult medical examination without abnormal findings: Secondary | ICD-10-CM

## 2011-06-25 DIAGNOSIS — Z136 Encounter for screening for cardiovascular disorders: Secondary | ICD-10-CM

## 2011-06-25 DIAGNOSIS — Z1211 Encounter for screening for malignant neoplasm of colon: Secondary | ICD-10-CM

## 2011-06-25 LAB — LIPID PANEL
Cholesterol: 263 mg/dL — ABNORMAL HIGH (ref 0–200)
HDL: 43 mg/dL (ref >39.00)
Total CHOL/HDL Ratio: 6
Triglycerides: 294 mg/dL — ABNORMAL HIGH (ref 0.0–149.0)
VLDL: 58.8 mg/dL — ABNORMAL HIGH (ref 0.0–40.0)

## 2011-06-25 LAB — COMPREHENSIVE METABOLIC PANEL
ALT: 27 U/L (ref 0–53)
AST: 24 U/L (ref 0–37)
Albumin: 3.7 g/dL (ref 3.5–5.2)
Alkaline Phosphatase: 73 U/L (ref 39–117)
BUN: 16 mg/dL (ref 6–23)
CO2: 29 mEq/L (ref 19–32)
Calcium: 9.3 mg/dL (ref 8.4–10.5)
Chloride: 101 mEq/L (ref 96–112)
Creatinine, Ser: 1.4 mg/dL (ref 0.4–1.5)
GFR: 56.44 mL/min — ABNORMAL LOW (ref >60.00)
Glucose, Bld: 101 mg/dL — ABNORMAL HIGH (ref 70–99)
Potassium: 4.3 mEq/L (ref 3.5–5.1)
Sodium: 139 mEq/L (ref 135–145)
Total Bilirubin: 0.6 mg/dL (ref 0.3–1.2)
Total Protein: 6.5 g/dL (ref 6.0–8.3)

## 2011-06-25 LAB — LDL CHOLESTEROL, DIRECT: Direct LDL: 169.8 mg/dL

## 2011-06-25 LAB — PSA: PSA: 0.78 ng/mL (ref 0.10–4.00)

## 2011-06-25 NOTE — Progress Notes (Signed)
Subjective:    Patient ID: Richard Giles, male    DOB: 06-18-1960, 51 y.o.   MRN: [id number]  HPI  Very pleasant 51 yo male here for CPX.  Neurological symptoms- non specific- has had four "episodes, " typically when he is "doing too much." Most recently occurred when he had a very long and stressful few days- worked all day, drove to [employer] to fix his son's hot water heater, had to drive back to [place] same day.  On drive back, had his "typical episode" where he felt like he was "acutely aware of light, sound, moving in slow motion."  It lasted the rest of that day.  Only rest made it better- woke up the next day and was fine. No associated n/v, HA, blurred vision, CP or SOB.  No diaphoresis.  Previous episode occurred under similar circumstances with similar symptoms and pattern of resolution.  BP a little elevated- rushed to get here and had "a coughing spell" in lobby.  BP was normal at work. Patient Active Problem List  Diagnoses  . GOUT  . SLEEP DISORDER  . Cough  . CHEST PAIN UNSPECIFIED  . ELEVATED BLOOD PRESSURE WITHOUT DIAGNOSIS OF HYPERTENSION  . ALLERGY, ENVIRONMENTAL  . HEMORRHOIDS, HX OF  . Allergy  . Abscess  . Routine general medical examination at a health care facility  . Neurological symptoms   Past Medical History  Diagnosis Date  . Allergy     allergy workup with Dr. Stevphen Rochester : Maybe trees Did not receive shots per patient  . Chronic cough onset 2005/ resolved on ICS July 19,2011    Sinus CT negative 07/08/2009 negative   Past Surgical History  Procedure Date  . Foot surgery 09/03/2003    Left foot osteophye removal (Dr. August Saucer)  . Septoplasty 1983    at 51 years of age   History  Substance Use Topics  . Smoking status: Never Smoker   . Smokeless tobacco: Former Neurosurgeon    Quit date: 02/23/1993  . Alcohol Use: No   Family History  Problem Relation Age of Onset  . Hypertension Father   . Diabetes Maternal Grandmother   . Depression Neg Hx    . Drug abuse Neg Hx   . Stroke Neg Hx    Allergies  Allergen Reactions  . Codeine     REACTION: unspecified   Current Outpatient Prescriptions on File Prior to Visit  Medication Sig Dispense Refill  . acetaminophen (TYLENOL) 500 MG tablet Take 500 mg by mouth every 6 (six) hours as needed.        Marland Kitchen albuterol (VENTOLIN HFA) 108 (90 BASE) MCG/ACT inhaler Inhale 2 puffs into the lungs every 4 (four) hours as needed. 2 puffs inhaled every 4-6 hours as needed for wheeze       . allopurinol (ZYLOPRIM) 300 MG tablet Take 1 tablet (300 mg total) by mouth daily.  30 tablet  1  . beclomethasone (QVAR) 80 MCG/ACT inhaler Take 2 puffs first thing in am and then another 2 puffs about 12 hours later.     1 Inhaler  11  . cyclobenzaprine (FLEXERIL) 10 MG tablet Take 1 tablet (10 mg total) by mouth at bedtime as needed.  30 tablet  0  . dextromethorphan (DELSYM) 30 MG/5ML liquid Take 60 mg by mouth as needed.         The PMH, PSH, Social History, Family History, Medications, and allergies have been reviewed in [facility], and have been updated if  relevant.  Review of Systems See HPI Patient reports no  vision/ hearing changes,anorexia, weight change, fever ,adenopathy, persistant / recurrent hoarseness, swallowing issues, chest pain, edema, hemoptysis, dyspnea(rest, exertional, paroxysmal nocturnal), gastrointestinal  bleeding (melena, rectal bleeding), abdominal pain, excessive heart burn, GU symptoms(dysuria, hematuria, pyuria, voiding/incontinence  Issues) syncope, focal weakness, severe memory loss, concerning skin lesions, depression, anxiety, abnormal bruising/bleeding, major joint swelling.       Objective:   Physical Exam BP 140/90  Pulse 72  Temp(Src) 97.8 F (36.6 C) (Oral)  Ht 5\' 11"  (1.803 m)  Wt 288 lb (130.636 kg)  BMI 40.17 kg/m2 General:  overweght male in NAD Eyes:  PERRL Ears:  External ear exam shows no significant lesions or deformities.  Otoscopic examination reveals clear  canals, tympanic membranes are intact bilaterally without bulging, retraction, inflammation or discharge. Hearing is grossly normal bilaterally. Nose:  External nasal examination shows no deformity or inflammation. Nasal mucosa are pink and moist without lesions or exudates. Mouth:  Oral mucosa and oropharynx without lesions or exudates.  Teeth in good repair. Neck:  no carotid bruit or thyromegaly no cervical or supraclavicular lymphadenopathy  Lungs:  Normal respiratory effort, chest expands symmetrically. Lungs are clear to auscultation, no crackles or wheezes. Heart:  Normal rate and regular rhythm. S1 and S2 normal without gallop, murmur, click, rub or other extra sounds. Abdomen:  Bowel sounds positive,abdomen soft and non-tender without masses, organomegaly or hernias noted. Pulses:  R and L posterior tibial pulses are full and equal bilaterally  Extremities:  no edema  Neuro:  CNII-XII intact, normal gait.    Assessment & Plan:   1. Routine general medical examination at a health care facility  Reviewed preventive care protocols, scheduled due services, and updated immunizations Discussed nutrition, exercise, diet, and healthy lifestyle.  Comprehensive metabolic panel Ambulatory referral to Gastroenterology Lipid Panel  2. Neurological symptoms  New- ? Migraine variant vs seizure. Will refer to neurology for further work up.  Ambulatory referral to Neurology  3. Screening for prostate cancer  The natural history of prostate cancer and ongoing controversy regarding screening and potential treatment outcomes of prostate cancer has been discussed with the patient. The meaning of a false positive PSA and a false negative PSA has been discussed. He indicates understanding of the limitations of this screening test and wishes to proceed with screening PSA testing.  PSA

## 2011-06-25 NOTE — Patient Instructions (Signed)
Great to see you. Please stop by to see Asher Muir on your way out to set up your colonoscopy appt and your neurology referral. We will call you with your lab results.

## 2011-06-25 NOTE — Telephone Encounter (Signed)
lmomtcb x1 for pt--pt last ov 10/12--prednisone not on pt medlist

## 2011-06-26 ENCOUNTER — Other Ambulatory Visit: Payer: Self-pay | Admitting: *Deleted

## 2011-06-26 MED ORDER — PREDNISONE 10 MG PO TABS: ORAL_TABLET | ORAL | Status: DC

## 2011-06-26 MED ORDER — ALLOPURINOL 300 MG PO TABS: 300.0000 mg | ORAL_TABLET | Freq: Every day | ORAL | Status: DC

## 2011-06-26 NOTE — Telephone Encounter (Signed)
Pt returned call, he is requesting refill on pred taper.  C/o prod cough, increased SOB esp with exertion, wheezing x4days.  Pt reports has been treating with delsym.  Pt states prednisone has worked well for him in the past and is requesting additional refills on this so that he may take the taper should the need arise again.  Please call patient back on cell: [phone]  Last ov with MW 10.19.12, follow up as needed. [facility]. Allergies  Allergen Reactions  . Codeine     REACTION: unspecified   Dr Sherene Sires please advise, thanks.

## 2011-06-26 NOTE — Telephone Encounter (Signed)
I spoke with pt and is aware of MW recs. rx has been sent to the pharmacy

## 2011-06-26 NOTE — Telephone Encounter (Signed)
Should start back on singulair 10 mg one each pm And continue the qvar ( this is prednisone)  If cough persists ok to use one cycle of prednisone only, then ov to regroup if net better  Prednisone 10 mg take  4 each am x 2 days,   2 each am x 2 days,  1 each am x2days and stop

## 2011-06-26 NOTE — Telephone Encounter (Signed)
lmomtcb x 2  

## 2011-06-29 ENCOUNTER — Telehealth: Payer: Self-pay

## 2011-06-29 ENCOUNTER — Telehealth: Payer: Self-pay | Admitting: Internal Medicine

## 2011-06-29 MED ORDER — MONTELUKAST SODIUM 10 MG PO TABS: 10.0000 mg | ORAL_TABLET | Freq: Every day | ORAL | Status: DC

## 2011-06-29 NOTE — Telephone Encounter (Signed)
Please call to check on pt. 

## 2011-06-29 NOTE — Telephone Encounter (Signed)
Spoke with patient, he says he is feeling better, has appt to see Dr. Sherene Sires on Thursday if not a lot better by then.

## 2011-06-29 NOTE — Telephone Encounter (Signed)
[facility] Triage Call Report Triage Record Num: [id number] Operator: Griselda Miner Patient Name: Richard Giles Call Date & Time: 06/28/2011 10:59:14AM Patient Phone: [phone] PCP: Patient Gender: Male PCP Fax : Patient DOB: Apr 15, 1960 Practice Name: [facility] Reason for Call: Caller: Crista Elliot; PCP: Other; CB#: [phone]; Call regarding Cough/Congestion; Is using Delysm; started Prednisone 06/27/11; Started with cold about 1-2 wks ago; Started during the night coughing continuously; non productive and not controllable; hard cough for past 6 hrs; Can't get comfortable and is exhausted from coughing: Temp afebrile; Pt describes it as like one of his coughing spells but prolonged; Triaged using Cough - Adult with a disposition to See ED Immediately with care advice given as appropriate. Pt and wife not sure if they will just watch him or take to ER. RN advised that ED evaluation with his symptoms would be the best. Protocol(s) Used: Cough - Adult Recommended Outcome per Protocol: See ED Immediately Reason for Outcome: New or worsening cough AND known cardiac or respiratory condition not responding to treatment OR treatment not available Care Advice: Call EMS 911 if develop new onset or increasing confusion or lethargy; breathing problems continue to worsen or skin becomes bluish/gray; develops chest pain. ~ ~ If you can, stop smoking now and avoid all secondhand smoke. ~ Place person in a position of comfort and loosen tight clothing. 06/28/2011 11:17:07AM Page 1 of 1 [email or web address]

## 2011-06-29 NOTE — Telephone Encounter (Signed)
Pt started Prednisone on Sat., 5/4 and says his cough has not improved. Per last phone note on 5/2, pt was to restart Singulair and continue on Qvar which he has not done. I have asked the pt to restart the Singulair and take his Qvar as prescribed to see if this helps. He has been scheduled to see MW on Thurs., 4/9 @ 11am but will call if his cough gets worse to get further recs or call to cancel the appt if cough improves. Pt verbalized understanding. Pt also advised to stop smoking if he has not already done so. He denied any smoking.   RX for Singulair sent to pt's pharmacy.

## 2011-06-29 NOTE — Telephone Encounter (Signed)
Left message asking pt to call back. 

## 2011-07-01 ENCOUNTER — Telehealth: Payer: Self-pay | Admitting: Internal Medicine

## 2011-07-01 NOTE — Telephone Encounter (Signed)
Pt made appt w/ TP instead of leaving a message.  Richard Giles

## 2011-07-02 ENCOUNTER — Ambulatory Visit (INDEPENDENT_AMBULATORY_CARE_PROVIDER_SITE_OTHER): Payer: BC Managed Care – PPO | Admitting: Adult Health

## 2011-07-02 ENCOUNTER — Encounter: Payer: Self-pay | Admitting: Adult Health

## 2011-07-02 ENCOUNTER — Ambulatory Visit: Payer: BC Managed Care – PPO | Admitting: Internal Medicine

## 2011-07-02 VITALS — BP 120/84 | HR 98 | Temp 97.1°F | Ht 71.0 in | Wt 293.0 lb

## 2011-07-02 DIAGNOSIS — R05 Cough: Secondary | ICD-10-CM

## 2011-07-02 MED ORDER — AZITHROMYCIN 250 MG PO TABS: ORAL_TABLET | ORAL | Status: DC

## 2011-07-02 MED ORDER — HYDROCODONE-HOMATROPINE 5-1.5 MG/5ML PO SYRP: 5.0000 mL | ORAL_SOLUTION | Freq: Four times a day (QID) | ORAL | Status: AC | PRN

## 2011-07-02 NOTE — Progress Notes (Signed)
Subjective:     Patient ID: Richard Giles, male   DOB: [birth year], 51 y.o.   MRN: [id number]  HPI  42  yowm never smoker no previous hx of allergies or asthma new onset recurrent bronchitis 2005 never completely better since then.   Jun 24, 2009 cc daily cough x 2005 maybe worse in spring and fall seems to peak after stirs around in am can become severe and lightheaded, also occ vomits from coughing. ventolin may helps some but does not eliminate it. prednisone helps some, just finished prednisone last month and much worse since stopped it. minimal actual sputum production. assoc with mild chronic nasal congestion. rec Prednisone x 12 days  Nexium 40 mg Take one 30-60 min before first meal of the day  Pepcid 20 mg at bedtime  Delsym 2 tsp every 12 hours as needed for cough  Stop symbicort   Jul 08, 2009 2 wk followup. Pt states that cough had improved while on prednisone. When he finished prednisone a few days ago cough started to come back. Cough was prod this am with white/yellow sputum. This has occurred despite compliance with diet and ppi. rec add qvar 80 2 bid   July 30, 2009 3 wk followup. Pt states that his cough has resolved.> rec  no change rx  12/12/10 ov/ [name] cc cough resolved completely on qvar and wants it refilled,   No doe >refill qvar , singulair d/c   07/02/2011 Acute OV  Complains of 3 weeks of initial URI that has progressed with increased prod cough with white/yellow mucus, increased SOB, wheezing, tightness in chest. Worse for last 5 days Was called in steroid pack that he finished this am. Prednisone helped only minimally.  Now coughing up thick yellow mucus.  No hemotpysis or chest pain . No edema.  Was under good control with cough until last 3 days.  Using delsym for cough.  Restarted singulair this week.  Restarted Nexium/Pepcid 2 weeks ago.  Has been using mint cough drops- " forgot about avoiding mints"         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 Dr Stevphen Rochester  - Maybe trees > did not rec shots per pt  Chronic cough  - onset 2005 > resolved on ICS September 10, 2009  - Allergy profile sent Jul 08, 2009 >> neg  - Sinus Ct ordered Jul 08, 2009 >> neg  - Qvar trial Jul 08, 2009 >> much better July 30, 2009 with 90% effective hfa  Review of Systems Constitutional:   No  weight loss, night sweats,  Fevers, chills, fatigue, or  lassitude.  HEENT:   No headaches,  Difficulty swallowing,  Tooth/dental problems, or  Sore throat,                No sneezing, itching, ear ache,  +nasal congestion, post nasal drip,   CV:  No chest pain,  Orthopnea, PND, swelling in lower extremities, anasarca, dizziness, palpitations, syncope.   GI  No heartburn, indigestion, abdominal pain, nausea, vomiting, diarrhea, change in bowel habits, loss of appetite, bloody stools.   Resp:   No coughing up of blood.   No chest wall deformity  Skin: no rash or lesions.  GU: no dysuria, change in color of urine, no urgency or frequency.  No flank pain, no hematuria   MS:  No joint pain or swelling.  No decreased range of motion.  No back pain.  Psych:  No change in mood or  affect. No depression or anxiety.  No memory loss.         Objective:   Physical Exam Obese wm nad  Wt 276 12/12/10  HEENT: nl dentition, turbinates, and orophanx. Nl external ear canals without cough reflex   NECK :  without JVD/Nodes/TM/ nl carotid upstrokes bilaterally   LUNGS: no acc muscle use, clear to A and P bilaterally without cough on insp or exp maneuvers   CV:  RRR  no s3 or murmur or increase in P2, no edema   ABD:  soft and nontender with nl excursion in the supine position. No bruits or organomegaly, bowel sounds nl  MS:  warm without deformities, calf tenderness, cyanosis or clubbing  SKIN: warm and dry without lesions    NEURO:  alert, approp, no deficits      Assessment:         Plan:

## 2011-07-02 NOTE — Patient Instructions (Signed)
Zpack take as directed.  Delsym 2 teaspoons twice daily as needed for cough. Avoid Mint products.  Hydromet 1-2 teaspoons every 4-6 hours as needed for cough, may make you sleepy. Use sugarless candy, water, ice chips to help avoid coughing Followup in 6 weeks and as needed with Dr. Sherene Sires   Please contact office for sooner follow up if symptoms do not improve or worsen or seek emergency care

## 2011-07-02 NOTE — Assessment & Plan Note (Signed)
URI with upper airway cough syndrome   Plan:  Zpack take as directed.  Delsym 2 teaspoons twice daily as needed for cough. Avoid Mint products.  Hydromet 1-2 teaspoons every 4-6 hours as needed for cough, may make you sleepy. Use sugarless candy, water, ice chips to help avoid coughing Followup in 6 weeks and as needed with Dr. Sherene Sires   Please contact office for sooner follow up if symptoms do not improve or worsen or seek emergency care

## 2011-07-06 ENCOUNTER — Encounter: Payer: Self-pay | Admitting: Family Medicine

## 2011-07-06 ENCOUNTER — Ambulatory Visit (INDEPENDENT_AMBULATORY_CARE_PROVIDER_SITE_OTHER): Payer: BC Managed Care – PPO | Admitting: Family Medicine

## 2011-07-06 VITALS — BP 130/90 | HR 88 | Temp 97.8°F | Wt 286.0 lb

## 2011-07-06 DIAGNOSIS — B029 Zoster without complications: Secondary | ICD-10-CM

## 2011-07-06 DIAGNOSIS — E785 Hyperlipidemia, unspecified: Secondary | ICD-10-CM | POA: Insufficient documentation

## 2011-07-06 DIAGNOSIS — E1169 Type 2 diabetes mellitus with other specified complication: Secondary | ICD-10-CM | POA: Insufficient documentation

## 2011-07-06 MED ORDER — VALACYCLOVIR HCL 1 G PO TABS: 1000.0000 mg | ORAL_TABLET | Freq: Three times a day (TID) | ORAL | Status: AC

## 2011-07-06 MED ORDER — ATORVASTATIN CALCIUM 20 MG PO TABS: 20.0000 mg | ORAL_TABLET | Freq: Every day | ORAL | Status: DC

## 2011-07-06 NOTE — Progress Notes (Signed)
Subjective:    Patient ID: Richard Giles, male    DOB: [birth year], 51 y.o.   MRN: [id number]  HPI  51 yo here to discuss labs further and for rash on his chest.  1.  HLD-  Elevated TG and LDL.  Lab Results  Component Value Date   CHOL 263* 06/25/2011   HDL 43.00 06/25/2011   LDLDIRECT 169.8 06/25/2011   TRIG 294.0* 06/25/2011   CHOLHDL 6 06/25/2011   2.  Painful rash on chest- noticed a couple of days ago.  Very painful and itchy. Never had anything like this before.  Has not been working in the yard.  Has had poison ivy and oak in past, this feels different. No fever. No changes in soaps or detergents.   Patient Active Problem List  Diagnoses  . GOUT  . SLEEP DISORDER  . Cough  . CHEST PAIN UNSPECIFIED  . ELEVATED BLOOD PRESSURE WITHOUT DIAGNOSIS OF HYPERTENSION  . ALLERGY, ENVIRONMENTAL  . HEMORRHOIDS, HX OF  . Allergy  . Abscess  . Routine general medical examination at a health care facility  . Neurological symptoms  . Hyperlipidemia  . Shingles   Past Medical History  Diagnosis Date  . Allergy     allergy workup with Dr. Stevphen Rochester : Maybe trees Did not receive shots per patient  . Chronic cough onset 2005/ resolved on ICS July 19,2011    Sinus CT negative 07/08/2009 negative   Past Surgical History  Procedure Date  . Foot surgery 09/03/2003    Left foot osteophye removal (Dr. August Saucer)  . Septoplasty 1983    at 51 years of age   History  Substance Use Topics  . Smoking status: Never Smoker   . Smokeless tobacco: Former Neurosurgeon    Quit date: 02/23/1993  . Alcohol Use: No   Family History  Problem Relation Age of Onset  . Hypertension Father   . Diabetes Maternal Grandmother   . Depression Neg Hx   . Drug abuse Neg Hx   . Stroke Neg Hx    Allergies  Allergen Reactions  . Codeine     REACTION: unspecified   Current Outpatient Prescriptions on File Prior to Visit  Medication Sig Dispense Refill  . acetaminophen (TYLENOL) 500 MG tablet Take 500 mg by  mouth every 6 (six) hours as needed.        Marland Kitchen albuterol (VENTOLIN HFA) 108 (90 BASE) MCG/ACT inhaler 2 puffs inhaled every 4-6 hours as needed for wheeze      . allopurinol (ZYLOPRIM) 300 MG tablet Take 1 tablet (300 mg total) by mouth daily.  30 tablet  11  . atorvastatin (LIPITOR) 20 MG tablet Take 1 tablet (20 mg total) by mouth daily.  90 tablet  3  . beclomethasone (QVAR) 80 MCG/ACT inhaler Take 2 puffs first thing in am and then another 2 puffs about 12 hours later.     1 Inhaler  11  . cyclobenzaprine (FLEXERIL) 10 MG tablet Take 1 tablet (10 mg total) by mouth at bedtime as needed.  30 tablet  0  . dextromethorphan (DELSYM) 30 MG/5ML liquid Take 60 mg by mouth as needed.        Marland Kitchen esomeprazole (NEXIUM) 40 MG capsule Take 40 mg by mouth daily before breakfast.      . famotidine (PEPCID) 20 MG tablet Take 20 mg by mouth at bedtime as needed.      Marland Kitchen HYDROcodone-homatropine (HYDROMET) 5-1.5 MG/5ML syrup Take 5 mLs by mouth  every 6 (six) hours as needed for cough.  240 mL  0  . montelukast (SINGULAIR) 10 MG tablet Take 1 tablet (10 mg total) by mouth at bedtime.  30 tablet  2   The PMH, PSH, Social History, Family History, Medications, and allergies have been reviewed in [facility], and have been updated if relevant.    Review of Systems See HPI     Objective:   Physical Exam BP 130/90  Pulse 88  Temp(Src) 97.8 F (36.6 C) (Oral)  Wt 286 lb (129.729 kg) General:  overweght male in NAD Eyes:  PERRL Ears:  External ear exam shows no significant lesions or deformities.  Otoscopic examination reveals clear canals, tympanic membranes are intact bilaterally without bulging, retraction, inflammation or discharge. Hearing is grossly normal bilaterally. Nose:  External nasal examination shows no deformity or inflammation. Nasal mucosa are pink and moist without lesions or exudates. Mouth:  Oral mucosa and oropharynx without lesions or exudates.  Teeth in good repair. Pulses:  R and L posterior  tibial pulses are full and equal bilaterally  Extremities:  no edema  Skin:  Vesicular erythematous rash on right chest, dermatomal distribution, does not cross midline.    Assessment & Plan:   1. Hyperlipidemia   >25 min spent with face to face with patient, >50% counseling and/or coordinating care.  Start lipitor 20 mg daily.  Recheck labs in 2-3 months.   Comprehensive metabolic panel, Lipid Panel  2. Shingles  Valtrex 1 gram three times daily x 7 days. See pt instructions for supportive.

## 2011-07-06 NOTE — Patient Instructions (Signed)
Shingles Shingles is caused by the same virus that causes chickenpox (varicella zoster virus or VZV). Shingles often occurs many years or decades after having chickenpox. That is why it is more common in adults older than 50 years. The virus reactivates and breaks out as an infection in a nerve root. SYMPTOMS   The initial feeling (sensations) may be pain. This pain is usually described as:   Burning.   Stabbing.   Throbbing.   Tingling in the nerve root.   A red rash will follow in a couple days. The rash may occur in any area of the body and is usually on one side (unilateral) of the body in a band or belt-like pattern. The rash usually starts out as very small blisters (vesicles). They will dry up after 7 to 10 days. This is not usually a significant problem except for the pain it causes.   Long-lasting (chronic) pain is more likely in an elderly person. It can last months to years. This condition is called postherpetic neuralgia.  Shingles can be an extremely severe infection in someone with AIDS, a weakened immune system, or with forms of leukemia. It can also be severe if you are taking transplant medicines or other medicines that weaken the immune system. TREATMENT  Your caregiver will often treat you with:  Antiviral drugs.   Anti-inflammatory drugs.   Pain medicines.  Bed rest is very important in preventing the pain associated with herpes zoster (postherpetic neuralgia). Application of heat in the form of a hot water bottle or electric heating pad or gentle pressure with the hand is recommended to help with the pain or discomfort. PREVENTION  A varicella zoster vaccine is available to help protect against the virus. The Food and Drug Administration approved the varicella zoster vaccine for individuals 8 years of age and older. HOME CARE INSTRUCTIONS   Cool compresses to the area of rash may be helpful.   Only take over-the-counter or prescription medicines for pain,  discomfort, or fever as directed by your caregiver.   Avoid contact with:   Babies.   Pregnant women.   Children with eczema.   Elderly people with transplants.   People with chronic illnesses, such as leukemia and AIDS.   If the area involved is on your face, you may receive a referral for follow-up to a specialist. It is very important to keep all follow-up appointments. This will help avoid eye complications, chronic pain, or disability.  SEEK IMMEDIATE MEDICAL CARE IF:   You develop any pain (headache) in the area of the face or eye. This must be followed carefully by your caregiver or ophthalmologist. An infection in part of your eye (cornea) can be very serious. It could lead to blindness.   You do not have pain relief from prescribed medicines.   Your redness or swelling spreads.   The area involved becomes very swollen and painful.   You have a fever.   You notice any red or painful lines extending away from the affected area toward your heart (lymphangitis).   Your condition is worsening or has changed.  Document Released: 02/09/2005 Document Revised: 01/29/2011 Document Reviewed: 01/14/2009 [facility] Patient Information 2012 [place], [place].   Take Valtrex as directed. We are starting lipitor. Please come in 2 months for follow up labs.

## 2011-08-06 ENCOUNTER — Encounter: Payer: Self-pay | Admitting: Internal Medicine

## 2011-08-06 ENCOUNTER — Ambulatory Visit (AMBULATORY_SURGERY_CENTER): Payer: BC Managed Care – PPO | Admitting: *Deleted

## 2011-08-06 VITALS — Ht 72.0 in | Wt 293.3 lb

## 2011-08-06 DIAGNOSIS — Z1211 Encounter for screening for malignant neoplasm of colon: Secondary | ICD-10-CM

## 2011-08-06 MED ORDER — NA SULFATE-K SULFATE-MG SULF 17.5-3.13-1.6 GM/177ML PO SOLN: ORAL | Status: DC

## 2011-08-06 NOTE — Progress Notes (Signed)
No allergies to eggs or soybeans 

## 2011-08-13 ENCOUNTER — Ambulatory Visit: Payer: BC Managed Care – PPO | Admitting: Internal Medicine

## 2011-08-20 ENCOUNTER — Ambulatory Visit (AMBULATORY_SURGERY_CENTER): Payer: BC Managed Care – PPO | Admitting: Internal Medicine

## 2011-08-20 ENCOUNTER — Encounter: Payer: Self-pay | Admitting: Internal Medicine

## 2011-08-20 VITALS — BP 142/83 | HR 92 | Temp 96.3°F | Resp 18 | Ht 72.0 in | Wt 293.0 lb

## 2011-08-20 DIAGNOSIS — D126 Benign neoplasm of colon, unspecified: Secondary | ICD-10-CM

## 2011-08-20 DIAGNOSIS — Z1211 Encounter for screening for malignant neoplasm of colon: Secondary | ICD-10-CM

## 2011-08-20 MED ORDER — SODIUM CHLORIDE 0.9 % IV SOLN: 500.0000 mL | INTRAVENOUS | Status: DC

## 2011-08-20 NOTE — Progress Notes (Signed)
The pt tolerated the colonoscopy very well. Maw   

## 2011-08-20 NOTE — Patient Instructions (Addendum)

## 2011-08-20 NOTE — Progress Notes (Signed)
Patient did not experience any of the following events: a burn prior to discharge; a fall within the facility; wrong site/side/patient/procedure/implant event; or a hospital transfer or hospital admission upon discharge from the facility. (G8907) Patient did not have preoperative order for IV antibiotic SSI prophylaxis. (G8918)  

## 2011-08-20 NOTE — Progress Notes (Signed)
09:40 Richard Giles hung 2nd bag of normal saline 0.9% 500 ml. Maw

## 2011-08-20 NOTE — Op Note (Signed)
[facility] [address] [place], [place]  [address]  COLONOSCOPY PROCEDURE REPORT  PATIENT:  Kollen, Armenti  MR#:  [id number] BIRTHDATE:  [birth year], 50 yrs. old  GENDER:  male ENDOSCOPIST:  Carie Caddy. Rubbie Goostree, MD REF. BY:  Ruthe Mannan, M.D. PROCEDURE DATE:  08/20/2011 PROCEDURE:  Colonoscopy with snare polypectomy ASA CLASS:  Class II INDICATIONS:  Routine Risk Screening, 1st colonoscopy MEDICATIONS:   MAC sedation, administered by CRNA, propofol (Diprivan) 450 mg  DESCRIPTION OF PROCEDURE:   After the risks benefits and alternatives of the procedure were thoroughly explained, informed consent was obtained.  Digital rectal exam was performed and revealed no rectal masses.   The LB CF-[id number] endoscope was introduced through the anus and advanced to the cecum, which was identified by both the appendix and ileocecal valve, without limitations.  The quality of the prep was Suprep good.  The instrument was then slowly withdrawn as the colon was fully examined.<<PROCEDUREIMAGES>>  FINDINGS:  A 5 mm sessile polyp was found in the ascending colon. Polyp was snared without cautery. Retrieval was successful.  A 15 mm sessile polyp was found in the ascending colon. Polyp was snared entirely, then cauterized with monopolar cautery. Retrieval was successful.  Initially a Lucina Mellow net was used to retrieve largest polyp, but when other polyps were encountered the largest polyp was cut with cold snare so that it could be retrieved via the suction channel. A 5 mm sessile polyp was found in the mid transverse colon. Polyp was snared without cautery. Retrieval was successful.  Two sessile polyps, 2 - 3 mm in size were found in the recto-sigmoid colon. Polyps were snared without cautery. Retrieval was successful.  Retroflexed views in the rectum revealed internal hemorrhoids.   The scope was then withdrawn from the cecum and the procedure completed.  COMPLICATIONS:  None ENDOSCOPIC  IMPRESSION: 1) Sessile polyp in the ascending colon. Removed and sent to pathology. 2) Sessile polyp in the ascending colon. Removed and sent to pathology. 3) Sessile polyp in the mid transverse colon. Removed and sent to pathology. 4) 2 Sessile polyps in the recto-sigmoid colon. Removed and sent to pathology. 5) Internal hemorrhoids  RECOMMENDATIONS: 1) Hold aspirin, aspirin products, and anti-inflammatory medication for 2 weeks. 2) Await pathology results 3) If the polyps removed today are proven to be adenomatous (pre-cancerous) polyps, you will need a colonoscopy in 3 years. Otherwise you should continue to follow colorectal cancer screening guidelines for "routine risk" patients with a colonoscopy in 10 years. You will receive a letter within 1-2 weeks with the results of your biopsy as well as final recommendations. Please call my office if you have not received a letter after 3 weeks.  Carie Caddy. Rhea Belton, MD  CC:  Ruthe Mannan MD The Patient  n. eSIGNEDCarie Caddy. Francies Inch at 08/20/2011 10:06 AM  Atlee Abide, [phone]

## 2011-08-21 ENCOUNTER — Telehealth: Payer: Self-pay | Admitting: *Deleted

## 2011-08-21 NOTE — Telephone Encounter (Signed)
  Follow up Call-  Call back number 08/20/2011  Post procedure Call Back phone  # [phone]  Permission to leave phone message Yes     Patient questions:  Do you have a fever, pain , or abdominal swelling? no Pain Score  0 *  Have you tolerated food without any problems? yes  Have you been able to return to your normal activities? yes  Do you have any questions about your discharge instructions: Diet   no Medications  no Follow up visit  no  Do you have questions or concerns about your Care? no  Actions: * If pain score is 4 or above: No action needed, pain <4.

## 2011-08-31 ENCOUNTER — Encounter: Payer: Self-pay | Admitting: Neurology

## 2011-08-31 ENCOUNTER — Ambulatory Visit (INDEPENDENT_AMBULATORY_CARE_PROVIDER_SITE_OTHER): Payer: BC Managed Care – PPO | Admitting: Neurology

## 2011-08-31 VITALS — BP 144/94 | HR 90 | Ht 71.5 in | Wt 289.0 lb

## 2011-08-31 DIAGNOSIS — R2990 Unspecified symptoms and signs involving the nervous system: Secondary | ICD-10-CM

## 2011-08-31 DIAGNOSIS — R29818 Other symptoms and signs involving the nervous system: Secondary | ICD-10-CM

## 2011-08-31 NOTE — Progress Notes (Signed)
Dear Dr. Dayton Martes,  Thank you for having me see Richard Giles at [facility] for his problem with spells of "zoning out", increased acuity.  As you may recall, he is a 51 y.o. year old male with a history of obesity who has had 4 spells lasting hours where he feels slightly zoned out", but his senses are heightened.  These seem to come on quickly but go away slowly.  He can function through them, although they do make him anxious.  There is no headache.  They have all occurred in the setting of increased stress.  No palpitations or SOB with the spells.  No positive visual phenomenon.  No nausea.+ light sensitivity.  Patient has headaches but they are usually occipital with increased stress.  No photophobia during headaches, although is light sensitive at baseline.  No nausea with headaches.  Much less common to be frontal, although has had "sinus headaches."  Past Medical History  Diagnosis Date  . Allergy     allergy workup with Dr. Stevphen Rochester : Maybe trees Did not receive shots per patient  . Chronic cough onset 2005/ resolved on ICS July 19,2011    Sinus CT negative 07/08/2009 negative  . Chronic bronchitis   . GERD (gastroesophageal reflux disease)   . Hyperlipidemia   . Restless leg syndrome   . Sleep apnea     does not have to use cpap   - no seizure risk factors. Past Surgical History  Procedure Date  . Foot surgery 09/03/2003    Left foot osteophye removal (Dr. August Saucer)  . Septoplasty 1983    at 51 years of age  . Bialteral inguinal hernia 1967    History   Social History  . Marital Status: Married    Spouse Name: N/A    Number of Children: N/A  . Years of Education: N/A   Social History Main Topics  . Smoking status: Never Smoker   . Smokeless tobacco: Former Neurosurgeon    Quit date: 02/23/1993  . Alcohol Use: No  . Drug Use: No  . Sexually Active: None   Other Topics Concern  . None   Social History Narrative  . None    Family  History  Problem Relation Age of Onset  . Hypertension Father   . Diabetes Maternal Grandmother   . Depression Neg Hx   . Drug abuse Neg Hx   . Stroke Neg Hx   . Colon cancer Neg Hx   . Stomach cancer Neg Hx   - no seizures  Current Outpatient Prescriptions on File Prior to Visit  Medication Sig Dispense Refill  . acetaminophen (TYLENOL) 500 MG tablet Take 500 mg by mouth every 6 (six) hours as needed.        Marland Kitchen albuterol (VENTOLIN HFA) 108 (90 BASE) MCG/ACT inhaler 2 puffs inhaled every 4-6 hours as needed for wheeze      . allopurinol (ZYLOPRIM) 300 MG tablet Take 1 tablet (300 mg total) by mouth daily.  30 tablet  11  . atorvastatin (LIPITOR) 20 MG tablet Take 1 tablet (20 mg total) by mouth daily.  90 tablet  3  . beclomethasone (QVAR) 80 MCG/ACT inhaler Take 2 puffs first thing in am and then another 2 puffs about 12 hours later.     1 Inhaler  11  . cyclobenzaprine (FLEXERIL) 10 MG tablet Take 1 tablet (10 mg total) by mouth at bedtime as needed.  30 tablet  0  .  dextromethorphan (DELSYM) 30 MG/5ML liquid Take 60 mg by mouth as needed.        Marland Kitchen esomeprazole (NEXIUM) 40 MG capsule Take 40 mg by mouth daily before breakfast.      . famotidine (PEPCID) 20 MG tablet Take 20 mg by mouth at bedtime as needed.      . fexofenadine (ALLEGRA) 180 MG tablet Take 180 mg by mouth daily.      Marland Kitchen ibuprofen (ADVIL,MOTRIN) 200 MG tablet Take 200 mg by mouth every 6 (six) hours as needed.      . montelukast (SINGULAIR) 10 MG tablet Take 1 tablet (10 mg total) by mouth at bedtime.  30 tablet  2  . valACYclovir (VALTREX) 1000 MG tablet Take 1 tablet (1,000 mg total) by mouth 3 (three) times daily.  21 tablet  0   Current Facility-Administered Medications on File Prior to Visit  Medication Dose Route Frequency Provider Last Rate Last Dose  . 0.9 %  sodium chloride infusion  500 mL Intravenous Continuous Beverley Fiedler, MD        Allergies  Allergen Reactions  . Bee Venom   . Codeine Diarrhea and  Nausea And Vomiting      ROS:  13 systems were reviewed and are notable for chronic cough and sob.  All other review of systems are unremarkable.   Examination:  Filed Vitals:   08/31/11 1324  BP: 144/94  Pulse: 90  Height: 5' 11.5" (1.816 m)  Weight: 289 lb (131.09 kg)     In general, well appearing , obese man.  Cardiovascular: The patient has a regular rate and rhythm .  Fundoscopy:  Disks are flat. Vessel caliber within normal limits.  Mental status:   The patient is oriented to person, place and time. Recent and remote memory are intact. Attention span and concentration are normal. Language including repetition, naming, following commands are intact. Fund of knowledge of current and historical events, as well as vocabulary are normal.  Cranial Nerves: Pupils are equally round and reactive to light. Visual fields full to confrontation. Extraocular movements are intact without nystagmus. Facial sensation and muscles of mastication are intact. Muscles of facial expression are symmetric. Hearing intact to bilateral finger rub. Tongue protrusion, uvula, palate midline.  Shoulder shrug intact  Motor:  The patient has normal bulk and tone, no pronator drift.  There are no adventitious movements.  5/5 muscle strength bilaterally.  Reflexes:   Biceps  Triceps Brachioradialis Knee Ankle  Right 2+  2+  2+   2+ 1+  Left  2+  2+  2+   2+ 2+  Toes down  Coordination:  Normal finger to nose.  No dysdiadokinesia.  Sensation is intact to temperature and vibration.  Gait and Station are normal.  Tandem gait is intact.  Romberg is negative   Impression/Recs: 1.  Spells of increased acuity/zoning out - I think the most likely explanation is migraine variant given their length.  However, increased anxiety is a possibility as well.  I did recommend a routine EEG and MRI brain.  He is going to think about it.  I think they are very unlikely to show anything revealing.  If he has  other spells he will let me know.  If these are migraine auras without headache there really are not great medications to try to "stop an aura"., although I recommended he try ibuprofen or naproxen as a trial abortive.  If these become more frequent then I would push for  the EEG and MRI brain and he may need a preventative such as topiramate.  Thank you for having [place] see Richard Giles in consultation.  Feel free to contact me with any questions.  Lupita Raider Modesto Charon, MD [facility], [place] [address] [place], [place] [address] Phone: [phone] Fax: [phone].

## 2011-09-02 ENCOUNTER — Telehealth: Payer: Self-pay | Admitting: Internal Medicine

## 2011-09-02 ENCOUNTER — Encounter: Payer: Self-pay | Admitting: Internal Medicine

## 2011-09-02 NOTE — Telephone Encounter (Signed)
error 

## 2011-09-03 ENCOUNTER — Ambulatory Visit (INDEPENDENT_AMBULATORY_CARE_PROVIDER_SITE_OTHER): Payer: BC Managed Care – PPO | Admitting: Internal Medicine

## 2011-09-03 ENCOUNTER — Encounter: Payer: Self-pay | Admitting: Internal Medicine

## 2011-09-03 VITALS — BP 112/82 | HR 96 | Temp 97.8°F | Ht 72.0 in | Wt 289.6 lb

## 2011-09-03 DIAGNOSIS — R05 Cough: Secondary | ICD-10-CM

## 2011-09-03 DIAGNOSIS — T7840XA Allergy, unspecified, initial encounter: Secondary | ICD-10-CM

## 2011-09-03 MED ORDER — PREDNISONE 10 MG PO TABS: ORAL_TABLET | ORAL | Status: DC

## 2011-09-03 NOTE — Assessment & Plan Note (Signed)
Explained natural history of uri and why it's necessary in patients at risk to treat GERD aggressively  at least  short term   to reduce risk of evolving cyclical cough initially  triggered by epithelial injury and a heightened sensitivty to the effects of any upper airway irritants,  most importantly acid - related.  That is, the more sensitive the epithelium damaged for virus, the more the cough, the more the secondary reflux (especially in those prone to reflux) the more the irritation of the sensitive mucosa and so on in a cyclical pattern.   Should be able to control what amt to a mild cough variant asthma with just the qvar as long as he remembers the contingencies as outlined for max gerd rx during flare (regardless of specific perceived trigger)

## 2011-09-03 NOTE — Progress Notes (Signed)
Subjective:     Patient ID: Richard Giles, male   DOB: Jul 15, 1960, 51 y.o.   MRN: [id number]  HPI  89  yowm never smoker no previous hx of allergies or asthma new onset recurrent bronchitis 2005 never completely better since then.   Jun 24, 2009 cc daily cough x 2005 maybe worse in spring and fall seems to peak after stirs around in am can become severe and lightheaded, also occ vomits from coughing. ventolin may helps some but does not eliminate it. prednisone helps some, just finished prednisone last month and much worse since stopped it. minimal actual sputum production. assoc with mild chronic nasal congestion. rec Prednisone x 12 days  Nexium 40 mg Take one 30-60 min before first meal of the day  Pepcid 20 mg at bedtime  Delsym 2 tsp every 12 hours as needed for cough  Stop symbicort   Jul 08, 2009 2 wk followup. Pt states that cough had improved while on prednisone. When he finished prednisone a few days ago cough started to come back. Cough was prod this am with white/yellow sputum. This has occurred despite compliance with diet and ppi. rec add qvar 80 2 bid   July 30, 2009 3 wk followup. Pt states that his cough has resolved.> rec  no change rx  12/12/10 ov/ Richard Giles cc cough resolved completely on qvar and wants it refilled,   No doe >refill qvar , singulair d/c   07/02/2011 Acute OV  Complains of 3 weeks of initial URI that has progressed with increased prod cough with white/yellow mucus, increased SOB, wheezing, tightness in chest. Worse for last 5 days Was called in steroid pack that he finished this am. Prednisone helped only minimally.  Now coughing up thick yellow mucus.  No hemotpysis or chest pain . No edema.  Was under good control with cough until last 3 days.  Using delsym for cough.  Restarted singulair this week.  Restarted Nexium/Pepcid 2 weeks ago.  Has been using mint cough drops- " forgot about avoiding mints"  rec Zpack take as directed.  Delsym 2 teaspoons twice  daily as needed for cough. Avoid Mint products.  Hydromet 1-2 teaspoons every 4-6 hours as needed for cough, may make you sleepy. Use sugarless candy, water, ice chips to help avoid coughing   09/03/2011 f/u ov/Richard Giles cc Patient states better since last visit. States cough is better. Denies sob, wheezing, chest pain, and chest tightness. Wants prednisone dosepak for next flare. Note previously had good control x months before flared in setting of ? Richard Giles.  No need for saba at all now  Sleeping ok without nocturnal  or early am exacerbation  of respiratory  c/o's or need for noct saba. Also denies any obvious fluctuation of symptoms with weather or environmental changes or other aggravating or alleviating factors except as outlined above.  ROS  The following are not active complaints unless bolded sore throat, dysphagia, dental problems, itching, sneezing,  nasal congestion or excess/ purulent secretions, ear ache,   fever, chills, sweats, unintended wt loss, pleuritic or exertional cp, hemoptysis,  orthopnea pnd or leg swelling, presyncope, palpitations, heartburn, abdominal pain, anorexia, nausea, vomiting, diarrhea  or change in bowel or urinary habits, change in stools or urine, dysuria,hematuria,  rash, arthralgias, visual complaints, headache, numbness weakness or ataxia or problems with walking or coordination,  change in mood/affect or memory.             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 Dr  Richard Giles  - Maybe trees > did not rec shots per pt  Chronic cough  - onset 2005 > resolved on ICS September 10, 2009  - Allergy profile sent Jul 08, 2009 >> neg  - Sinus Ct ordered Jul 08, 2009 >> neg  - Qvar trial Jul 08, 2009 >> much better July 30, 2009 with 90% effective hfa            Objective:   Physical Exam Obese wm nad  Wt 276 12/12/10 > 09/03/2011   HEENT: nl dentition, turbinates, and orophanx. Nl external ear canals without cough reflex   NECK :  without JVD/Nodes/TM/ nl  carotid upstrokes bilaterally   LUNGS: no acc muscle use, clear to A and P bilaterally without cough on insp or exp maneuvers   CV:  RRR  no s3 or murmur or increase in P2, no edema   ABD:  soft and nontender with nl excursion in the supine position. No bruits or organomegaly, bowel sounds nl  MS:  warm without deformities, calf tenderness, cyanosis or clubbing       Assessment:         Plan:

## 2011-09-03 NOTE — Assessment & Plan Note (Signed)
Should be able to control rhinitis symptoms with prn allegra between flares.    Each maintenance medication was reviewed in detail including most importantly the difference between maintenance and as needed and under what circumstances the prns are to be used.  Please see instructions for details which were reviewed in writing and the patient given a copy.

## 2011-09-03 NOTE — Patient Instructions (Addendum)
Plan A = Qvar 80 Take 2 puffs first thing in am and then another 2 puffs about 12 hours later and blow it out through the nose.    Plan B Only use your albuterol as a rescue medication to be used if you can't catch your breath by resting or doing a relaxed purse lip breathing pattern. The less you use it, the better it will work when you need it.   Only use your allegra as a "rescue medication" for your nasal symptoms  Try Nexium 40 (prilosec 20mg  )   Take 30-60 min before first meal of the day and Pepcid 20 mg one bedtime until cough is completely gone for at least a week without the need for cough suppression  I think of reflux for chronic cough like I do oxygen for fire (doesn't cause the fire but once you get the oxygen suppressed it usually goes away regardless of the exact cause).    We need to see you yearly to refill your qvar, sooner as needed

## 2011-10-27 ENCOUNTER — Other Ambulatory Visit: Payer: Self-pay | Admitting: *Deleted

## 2011-10-27 MED ORDER — CYCLOBENZAPRINE HCL 10 MG PO TABS: 10.0000 mg | ORAL_TABLET | Freq: Every evening | ORAL | Status: DC | PRN

## 2011-10-27 NOTE — Telephone Encounter (Signed)
Faxed refill request from [facility], last filled 02/12/11.

## 2011-11-13 ENCOUNTER — Other Ambulatory Visit: Payer: Self-pay | Admitting: *Deleted

## 2011-11-13 MED ORDER — MONTELUKAST SODIUM 10 MG PO TABS: 10.0000 mg | ORAL_TABLET | Freq: Every day | ORAL | Status: DC

## 2012-02-03 ENCOUNTER — Ambulatory Visit (INDEPENDENT_AMBULATORY_CARE_PROVIDER_SITE_OTHER): Payer: BC Managed Care – PPO | Admitting: Family Medicine

## 2012-02-03 ENCOUNTER — Encounter: Payer: Self-pay | Admitting: Family Medicine

## 2012-02-03 VITALS — BP 138/98 | HR 76 | Temp 97.6°F | Wt 281.0 lb

## 2012-02-03 DIAGNOSIS — B356 Tinea cruris: Secondary | ICD-10-CM

## 2012-02-03 MED ORDER — CLOTRIMAZOLE-BETAMETHASONE 1-0.05 % EX CREA: TOPICAL_CREAM | Freq: Two times a day (BID) | CUTANEOUS | Status: DC

## 2012-02-03 NOTE — Patient Instructions (Addendum)
Jock Itch Jock itch is a fungal infection of the skin in the groin area. It is sometimes called "ringworm" even though it is not caused by a worm. A fungus is a type of germ that thrives in dark, damp places.  CAUSES  This infection may spread from:  A fungus infection elsewhere on the body (such as athlete's foot).  Sharing towels or clothing. This infection is more common in:  Hot, humid climates.  People who wear tight-fitting clothing or wet bathing suits for long periods of time.  Athletes.  Overweight people.  People with diabetes. SYMPTOMS  Jock itch causes the following symptoms:  Red, pink or brown rash in the groin. Rash may spread to the thighs, anus, and buttocks.  Itching. DIAGNOSIS  Your caregiver may make the diagnosis by looking at the rash. Sometimes a skin scraping will be sent to test for fungus. Testing can be done either by looking under the microscope or by doing a culture (test to try to grow the fungus). A culture can take up to 2 weeks to come back. TREATMENT  Jock itch may be treated with:  Skin cream or ointment to kill fungus.  Medicine by mouth to kill fungus.  Skin cream or ointment to calm the itching.  Compresses or medicated powders to dry the infected skin. HOME CARE INSTRUCTIONS   Be sure to treat the rash completely. Follow your caregiver's instructions. It can take a couple of weeks to treat. If you do not treat the infection long enough, the rash can come back.  Wear loose-fitting clothing.  Men should wear cotton boxer shorts.  Women should wear cotton underwear.  Avoid hot baths.  Dry the groin area well after bathing. SEEK MEDICAL CARE IF:   Your rash is worse.  Your rash is spreading.  Your rash returns after treatment is finished.  Your rash is not gone in 4 weeks. Fungal infections are slow to respond to treatment. Some redness may remain for several weeks after the fungus is gone. SEEK IMMEDIATE MEDICAL CARE  IF:  The area becomes red, warm, tender, and swollen.  You have a fever. Document Released: 01/30/2002 Document Revised: 05/04/2011 Document Reviewed: 12/30/2007 Exit[employer] Patient Information 2013 [employer].  

## 2012-02-03 NOTE — Progress Notes (Signed)
Subjective:    Patient ID: Richard Giles, male    DOB: 1960/09/11, 51 y.o.   MRN: [id number]  HPI  Very pleasant 51 yo male here for rash around groin area for 2-3 months.  Past 3 months, he has been exercising several days per week at the gym- runs on treadmill, takes spinning classes. Feels great except for this rash.  Has tried Gold [employer]'s powder and has had no relief.  Rash is very itchy and feels "raw."  No drainage.  No fever.  Never had anything like this before.  Patient Active Problem List  Diagnosis  . GOUT  . SLEEP DISORDER  . Cough  . CHEST PAIN UNSPECIFIED  . ELEVATED BLOOD PRESSURE WITHOUT DIAGNOSIS OF HYPERTENSION  . ALLERGY, ENVIRONMENTAL  . HEMORRHOIDS, HX OF  . Allergic state  . Abscess  . Routine general medical examination at a health care facility  . Neurological symptoms  . Hyperlipidemia  . Shingles  . Tinea cruris   Past Medical History  Diagnosis Date  . Allergy     allergy workup with Dr. Stevphen Rochester : Maybe trees Did not receive shots per patient  . Chronic cough onset 2005/ resolved on ICS July 19,2011    Sinus CT negative 07/08/2009 negative  . Chronic bronchitis   . GERD (gastroesophageal reflux disease)   . Hyperlipidemia   . Restless leg syndrome   . Sleep apnea     does not have to use cpap   Past Surgical History  Procedure Date  . Foot surgery 09/03/2003    Left foot osteophye removal (Dr. August Saucer)  . Septoplasty 1983    at 51 years of age  . Bialteral inguinal hernia 1967   History  Substance Use Topics  . Smoking status: Never Smoker   . Smokeless tobacco: Former Neurosurgeon    Quit date: 02/23/1993  . Alcohol Use: No   Family History  Problem Relation Age of Onset  . Hypertension Father   . Diabetes Maternal Grandmother   . Depression Neg Hx   . Drug abuse Neg Hx   . Stroke Neg Hx   . Colon cancer Neg Hx   . Stomach cancer Neg Hx    Allergies  Allergen Reactions  . Bee Venom   . Codeine Diarrhea and Nausea And  Vomiting   Current Outpatient Prescriptions on File Prior to Visit  Medication Sig Dispense Refill  . acetaminophen (TYLENOL) 500 MG tablet Take 500 mg by mouth every 6 (six) hours as needed.        Marland Kitchen albuterol (VENTOLIN HFA) 108 (90 BASE) MCG/ACT inhaler 2 puffs inhaled every 4-6 hours as needed for wheeze      . allopurinol (ZYLOPRIM) 300 MG tablet Take 1 tablet (300 mg total) by mouth daily.  30 tablet  11  . atorvastatin (LIPITOR) 20 MG tablet Take 1 tablet (20 mg total) by mouth daily.  90 tablet  3  . beclomethasone (QVAR) 80 MCG/ACT inhaler Take 2 puffs first thing in am and then another 2 puffs about 12 hours later.     1 Inhaler  11  . cyclobenzaprine (FLEXERIL) 10 MG tablet Take 1 tablet (10 mg total) by mouth at bedtime as needed.  30 tablet  0  . dextromethorphan (DELSYM) 30 MG/5ML liquid Take 60 mg by mouth as needed.        Marland Kitchen esomeprazole (NEXIUM) 40 MG capsule Take 40 mg by mouth daily before breakfast.      .  famotidine (PEPCID) 20 MG tablet Take 20 mg by mouth at bedtime as needed.      . fexofenadine (ALLEGRA) 180 MG tablet Take 180 mg by mouth daily.      Marland Kitchen ibuprofen (ADVIL,MOTRIN) 200 MG tablet Take 200 mg by mouth every 6 (six) hours as needed.      . montelukast (SINGULAIR) 10 MG tablet Take 1 tablet (10 mg total) by mouth at bedtime.  30 tablet  8  . valACYclovir (VALTREX) 1000 MG tablet Take 1 tablet (1,000 mg total) by mouth 3 (three) times daily.  21 tablet  0  . predniSONE (DELTASONE) 10 MG tablet Take 4 x 2 days, 2 x 2 days, 1 x 2 days  14 tablet  0   The PMH, PSH, Social History, Family History, Medications, and allergies have been reviewed in [facility], and have been updated if relevant.    Review of Systems See HPI    Objective:   Physical Exam BP 138/98  Pulse 76  Temp 97.6 F (36.4 C)  Wt 281 lb (127.461 kg) Wt Readings from Last 3 Encounters:  02/03/12 281 lb (127.461 kg)  09/03/11 289 lb 9.6 oz (131.362 kg)  08/31/11 289 lb (131.09 kg)   Gen:   Alert, pleasant, NAD Skin: Large erythematous patch high on the inner aspect of  both thighs with partial central clearing and a slightly elevated, erythematous, sharply demarcated border        Assessment & Plan:   1. Tinea cruris    New-  Lotrisone to area twice daily for two weeks.  If no improvement, will try oral antifungal given duration of symptoms. Discussed treatment and prevention of tinea (handout given).

## 2012-03-07 ENCOUNTER — Ambulatory Visit: Payer: BC Managed Care – PPO | Admitting: Family Medicine

## 2012-03-14 ENCOUNTER — Ambulatory Visit (INDEPENDENT_AMBULATORY_CARE_PROVIDER_SITE_OTHER): Payer: BC Managed Care – PPO | Admitting: Family Medicine

## 2012-03-14 ENCOUNTER — Encounter: Payer: Self-pay | Admitting: Family Medicine

## 2012-03-14 VITALS — BP 130/90 | HR 72 | Temp 97.6°F | Wt 284.0 lb

## 2012-03-14 DIAGNOSIS — L732 Hidradenitis suppurativa: Secondary | ICD-10-CM

## 2012-03-14 MED ORDER — DOXYCYCLINE HYCLATE 100 MG PO TABS: 100.0000 mg | ORAL_TABLET | Freq: Two times a day (BID) | ORAL | Status: DC

## 2012-03-14 NOTE — Patient Instructions (Addendum)
Hidradenitis Suppurativa, Sweat Gland Abscess  Hidradenitis suppurativa is a long lasting (chronic), uncommon disease of the sweat glands. With this, boil-like lumps and scarring develop in the groin, some times under the arms (axillae), and under the breasts. It may also uncommonly occur behind the ears, in the crease of the buttocks, and around the genitals.   CAUSES   The cause is from a blocking of the sweat glands. They then become infected. It may cause drainage and odor. It is not contagious. So it cannot be given to someone else. It most often shows up in puberty (about 10 to 52 years of age). But it may happen much later. It is similar to acne which is a disease of the sweat glands. This condition is slightly more common in African-Americans and women.  SYMPTOMS    Hidradenitis usually starts as one or more red, tender, swellings in the groin or under the arms (axilla).   Over a period of hours to days the lesions get larger. They often open to the skin surface, draining clear to yellow-colored fluid.   The infected area heals with scarring.  DIAGNOSIS   Your caregiver makes this diagnosis by looking at you. Sometimes cultures (growing germs on plates in the lab) may be taken. This is to see what germ (bacterium) is causing the infection.   TREATMENT    Topical germ killing medicine applied to the skin (antibiotics) are the treatment of choice. Antibiotics taken by mouth (systemic) are sometimes needed when the condition is getting worse or is severe.   Avoid tight-fitting clothing which traps moisture in.   Dirt does not cause hidradenitis and it is not caused by poor hygiene.   Involved areas should be cleaned daily using an antibacterial soap. Some patients find that the liquid form of Lever 2000, applied to the involved areas as a lotion after bathing, can help reduce the odor related to this condition.   Sometimes surgery is needed to drain infected areas or remove scarred tissue. Removal of  large amounts of tissue is used only in severe cases.   Birth control pills may be helpful.   Oral retinoids (vitamin A derivatives) for 6 to 12 months which are effective for acne may also help this condition.   Weight loss will improve but not cure hidradenitis. It is made worse by being overweight. But the condition is not caused by being overweight.   This condition is more common in people who have had acne.   It may become worse under stress.  There is no medical cure for hidradenitis. It can be controlled, but not cured. The condition usually continues for years with periods of getting worse and getting better (remission).  Document Released: 09/24/2003 Document Revised: 05/04/2011 Document Reviewed: 10/10/2007  [employer] Patient Information 2013 [employer].

## 2012-03-14 NOTE — Progress Notes (Signed)
Subjective:    Patient ID: Richard Giles, male    DOB: Jan 28, 1961, 52 y.o.   MRN: [id number]  HPI  52 yo male here for recurrent "bumps" in his groin area.  Bumps come and go, typically do not drain and are often painful. No fevers.  Currently, no warmth or erythema but has had this at times.  Has had similar lesions in his arm pits.  Patient Active Problem List  Diagnosis  . GOUT  . SLEEP DISORDER  . CHEST PAIN UNSPECIFIED  . ELEVATED BLOOD PRESSURE WITHOUT DIAGNOSIS OF HYPERTENSION  . ALLERGY, ENVIRONMENTAL  . HEMORRHOIDS, HX OF  . Allergic state  . Abscess  . Routine general medical examination at a health care facility  . Hyperlipidemia  . Shingles  . Tinea cruris  . Hidradenitis suppurativa   Past Medical History  Diagnosis Date  . Allergy     allergy workup with Dr. Stevphen Rochester : Maybe trees Did not receive shots per patient  . Chronic cough onset 2005/ resolved on ICS July 19,2011    Sinus CT negative 07/08/2009 negative  . Chronic bronchitis   . GERD (gastroesophageal reflux disease)   . Hyperlipidemia   . Restless leg syndrome   . Sleep apnea     does not have to use cpap   Past Surgical History  Procedure Date  . Foot surgery 09/03/2003    Left foot osteophye removal (Dr. August Saucer)  . Septoplasty 1983    at 52 years of age  . Bialteral inguinal hernia 1967   History  Substance Use Topics  . Smoking status: Never Smoker   . Smokeless tobacco: Former Neurosurgeon    Quit date: 02/23/1993  . Alcohol Use: No   Family History  Problem Relation Age of Onset  . Hypertension Father   . Diabetes Maternal Grandmother   . Depression Neg Hx   . Drug abuse Neg Hx   . Stroke Neg Hx   . Colon cancer Neg Hx   . Stomach cancer Neg Hx    Allergies  Allergen Reactions  . Bee Venom   . Codeine Diarrhea and Nausea And Vomiting   Current Outpatient Prescriptions on File Prior to Visit  Medication Sig Dispense Refill  . acetaminophen (TYLENOL) 500 MG tablet Take  500 mg by mouth every 6 (six) hours as needed.        Marland Kitchen albuterol (VENTOLIN HFA) 108 (90 BASE) MCG/ACT inhaler 2 puffs inhaled every 4-6 hours as needed for wheeze      . allopurinol (ZYLOPRIM) 300 MG tablet Take 1 tablet (300 mg total) by mouth daily.  30 tablet  11  . atorvastatin (LIPITOR) 20 MG tablet Take 1 tablet (20 mg total) by mouth daily.  90 tablet  3  . beclomethasone (QVAR) 80 MCG/ACT inhaler Take 2 puffs first thing in am and then another 2 puffs about 12 hours later.     1 Inhaler  11  . clotrimazole-betamethasone (LOTRISONE) cream Apply topically 2 (two) times daily.  30 g  0  . cyclobenzaprine (FLEXERIL) 10 MG tablet Take 1 tablet (10 mg total) by mouth at bedtime as needed.  30 tablet  0  . dextromethorphan (DELSYM) 30 MG/5ML liquid Take 60 mg by mouth as needed.        Marland Kitchen esomeprazole (NEXIUM) 40 MG capsule Take 40 mg by mouth daily before breakfast.      . famotidine (PEPCID) 20 MG tablet Take 20 mg by mouth at bedtime as  needed.      . fexofenadine (ALLEGRA) 180 MG tablet Take 180 mg by mouth daily.      Marland Kitchen ibuprofen (ADVIL,MOTRIN) 200 MG tablet Take 200 mg by mouth every 6 (six) hours as needed.      . montelukast (SINGULAIR) 10 MG tablet Take 1 tablet (10 mg total) by mouth at bedtime.  30 tablet  8  . valACYclovir (VALTREX) 1000 MG tablet Take 1 tablet (1,000 mg total) by mouth 3 (three) times daily.  21 tablet  0  . predniSONE (DELTASONE) 10 MG tablet Take 4 x 2 days, 2 x 2 days, 1 x 2 days  14 tablet  0   The PMH, PSH, Social History, Family History, Medications, and allergies have been reviewed in [facility], and have been updated if relevant.   Review of Systems See HPI    Objective:   Physical Exam BP 130/90  Pulse 72  Temp 97.6 F (36.4 C)  Wt 284 lb (128.822 kg) Gen:  Alert, overweight male, NAD Skin: Multiple raised, non fluctuant, small abscesses- mostly scarred over, bilateral groin area No warmth or erythema    Assessment & Plan:   1. Hidradenitis  suppurativa    Explained typical chronic nature of hidradenitis and treatment.' Place on doxycycline 100 mg twice daily x 10 days given number of lesions. Given samples of topical abx ointment. Call or return to clinic prn if these symptoms worsen or fail to improve as anticipated.

## 2012-04-11 ENCOUNTER — Telehealth: Payer: Self-pay | Admitting: Internal Medicine

## 2012-04-11 MED ORDER — BECLOMETHASONE DIPROPIONATE 80 MCG/ACT IN AERS: INHALATION_SPRAY | RESPIRATORY_TRACT | Status: DC

## 2012-04-11 NOTE — Telephone Encounter (Signed)
Rx has been sent in, pt is aware. 

## 2012-06-03 ENCOUNTER — Other Ambulatory Visit: Payer: Self-pay | Admitting: Internal Medicine

## 2012-06-03 MED ORDER — PANTOPRAZOLE SODIUM 40 MG PO TBEC: 40.0000 mg | DELAYED_RELEASE_TABLET | Freq: Every day | ORAL | Status: DC

## 2012-07-14 ENCOUNTER — Other Ambulatory Visit: Payer: Self-pay | Admitting: Family Medicine

## 2012-09-02 ENCOUNTER — Encounter: Payer: Self-pay | Admitting: Internal Medicine

## 2012-09-02 ENCOUNTER — Ambulatory Visit (INDEPENDENT_AMBULATORY_CARE_PROVIDER_SITE_OTHER): Payer: BC Managed Care – PPO | Admitting: Internal Medicine

## 2012-09-02 VITALS — BP 112/80 | HR 99 | Temp 97.6°F | Ht 71.0 in | Wt 298.0 lb

## 2012-09-02 DIAGNOSIS — J45991 Cough variant asthma: Secondary | ICD-10-CM

## 2012-09-02 MED ORDER — BECLOMETHASONE DIPROPIONATE 80 MCG/ACT IN AERS: INHALATION_SPRAY | RESPIRATORY_TRACT | Status: DC

## 2012-09-02 NOTE — Progress Notes (Signed)
Subjective:     Patient ID: Richard Giles, male   DOB: 30-Sep-1960   MRN: [id number]  Brief patient profile:  25  yowm never smoker no previous hx of allergies or asthma new onset recurrent bronchitis 2005 never completely better since then until placed on qvar in pulmonary clinic 06/2009  Jun 24, 2009 cc daily cough x 2005 maybe worse in spring and fall seems to peak after stirs around in am can become severe and lightheaded, also occ vomits from coughing. ventolin may helps some but does not eliminate it. prednisone helps some, just finished prednisone last month and much worse since stopped it. minimal actual sputum production. assoc with mild chronic nasal congestion. rec Prednisone x 12 days  Nexium 40 mg Take one 30-60 min before first meal of the day  Pepcid 20 mg at bedtime  Delsym 2 tsp every 12 hours as needed for cough  Stop symbicort   Jul 08, 2009 2 wk followup. Pt states that cough had improved while on prednisone. When he finished prednisone a few days ago cough started to come back. Cough was prod this am with white/yellow sputum. This has occurred despite compliance with diet and ppi. rec add qvar 80 2 bid   July 30, 2009 3 wk followup. Pt states that his cough has resolved.> rec  no change rx  12/12/10 ov/ Richard Giles cc cough resolved completely on qvar and wants it refilled,   No doe >refill qvar , singulair d/c   07/02/2011 Acute OV  Complains of 3 weeks of initial URI that has progressed with increased prod cough with white/yellow mucus, increased SOB, wheezing, tightness in chest. Worse for last 5 days Was called in steroid pack that he finished this am. Prednisone helped only minimally.  Now coughing up thick yellow mucus.  No hemotpysis or chest pain . No edema.  Was under good control with cough until last 3 days.  Using delsym for cough.  Restarted singulair this week.  Restarted Nexium/Pepcid 2 weeks ago.  Has been using mint cough drops- " forgot about avoiding mints"   rec Zpack take as directed.  Delsym 2 teaspoons twice daily as needed for cough. Avoid Mint products.  Hydromet 1-2 teaspoons every 4-6 hours as needed for cough, may make you sleepy. Use sugarless candy, water, ice chips to help avoid coughing   09/03/2011 f/u ov/Richard Giles cc Patient states better since last visit. States cough is better.  rec Plan A = Qvar 80 Take 2 puffs first thing in am and then another 2 puffs about 12 hours later and blow it out through the nose. Plan B Only use your albuterol as a rescue medication to be used if you can't catch your breath by resting or doing a relaxed purse lip breathing pattern. The less you use it, the better it will work when you need it.  Only use your allegra as a "rescue medication" for your nasal symptoms Try Nexium 40 (prilosec 20mg  )   Take 30-60 min before first meal of the day and Pepcid 20 mg one bedtime until cough is completely gone for at least a week without the need for cough suppression   09/02/2012 f/u ov/Richard Giles re cough variant asthma/ good control on qvar/ here for yearly f/u/ refills Chief Complaint  Patient presents with  . Follow-up    Pt states doing well and denies any co's today.     Denies sob, wheezing, chest pain, and chest tightness or need for rescue saba  daytime, tolerating all desired physical activities.   Sleeping ok without nocturnal  or early am exacerbation  of respiratory  c/o's or need for noct saba. Also denies any obvious fluctuation of symptoms with weather or environmental changes or other aggravating or alleviating factors except as outlined above.  ROS  The following are not active complaints unless bolded sore throat, dysphagia, dental problems, itching, sneezing,  nasal congestion or excess/ purulent secretions, ear ache,   fever, chills, sweats, unintended wt loss, pleuritic or exertional cp, hemoptysis,  orthopnea pnd or leg swelling, presyncope, palpitations, heartburn, abdominal pain, anorexia,  nausea, vomiting, diarrhea  or change in bowel or urinary habits, change in stools or urine, dysuria,hematuria,  rash, arthralgias, visual complaints, headache, numbness weakness or ataxia or problems with walking or coordination,  change in mood/affect or memory.             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 Dr Stevphen Rochester  - Maybe trees > did not rec shots per pt  Chronic cough  - onset 2005 > resolved on ICS September 10, 2009  - Allergy profile sent Jul 08, 2009 >> neg  - Sinus Ct ordered Jul 08, 2009 >> neg  - Qvar trial Jul 08, 2009 >> much better July 30, 2009 with 90% effective hfa            Objective:   Physical Exam Obese wm nad   Wt Readings from Last 3 Encounters:  09/02/12 298 lb (135.172 kg)  03/14/12 284 lb (128.822 kg)  02/03/12 281 lb (127.461 kg)     HEENT: nl dentition, turbinates, and orophanx. Nl external ear canals without cough reflex   NECK :  without JVD/Nodes/TM/ nl carotid upstrokes bilaterally   LUNGS: no acc muscle use, clear to A and P bilaterally without cough on insp or exp maneuvers   CV:  RRR  no s3 or murmur or increase in P2, no edema   ABD:  soft and nontender with nl excursion in the supine position. No bruits or organomegaly, bowel sounds nl  MS:  warm without deformities, calf tenderness, cyanosis or clubbing       Assessment:

## 2012-09-02 NOTE — Patient Instructions (Signed)
Weight control is simply a matter of calorie balance which needs to be tilted in your favor by eating less and exercising more.  To get the most out of exercise, you need to be continuously aware that you are short of breath, but never out of breath, for 30 minutes daily. As you improve, it will actually be easier for you to do the same amount of exercise  in  30 minutes so always push to the level where you are short of breath.  If this does not result in gradual weight reduction then I strongly recommend you see a nutritionist with a food diary x 2 weeks so that we can work out a negative calorie balance which is universally effective in steady weight loss programs.  Think of your calorie balance like you do your bank account where in this case you want the balance to go down so you must take in less calories than you burn up.  It's just that simple:  Hard to do, but easy to understand.  Good luck!    If you are satisfied with your treatment plan let your doctor know and he/she can either refill your medications or you can return here when your prescription runs out.     If in any way you are not 100% satisfied,  please tell us.  If 100% better, tell your friends!

## 2012-09-05 ENCOUNTER — Other Ambulatory Visit: Payer: Self-pay | Admitting: Family Medicine

## 2012-09-05 DIAGNOSIS — J45991 Cough variant asthma: Secondary | ICD-10-CM | POA: Insufficient documentation

## 2012-09-05 DIAGNOSIS — Z0000 Encounter for general adult medical examination without abnormal findings: Secondary | ICD-10-CM

## 2012-09-05 DIAGNOSIS — Z125 Encounter for screening for malignant neoplasm of prostate: Secondary | ICD-10-CM

## 2012-09-05 DIAGNOSIS — E785 Hyperlipidemia, unspecified: Secondary | ICD-10-CM

## 2012-09-05 NOTE — Assessment & Plan Note (Signed)
All goals of chronic asthma control met including optimal function and elimination of symptoms with minimal need for rescue therapy.  Contingencies discussed in full including contacting this office immediately if not controlling the symptoms using the rule of two's.     F/u can be prn

## 2012-09-07 ENCOUNTER — Other Ambulatory Visit (INDEPENDENT_AMBULATORY_CARE_PROVIDER_SITE_OTHER): Payer: BC Managed Care – PPO

## 2012-09-07 DIAGNOSIS — Z125 Encounter for screening for malignant neoplasm of prostate: Secondary | ICD-10-CM

## 2012-09-07 DIAGNOSIS — R030 Elevated blood-pressure reading, without diagnosis of hypertension: Secondary | ICD-10-CM

## 2012-09-07 DIAGNOSIS — Z0000 Encounter for general adult medical examination without abnormal findings: Secondary | ICD-10-CM

## 2012-09-07 DIAGNOSIS — E785 Hyperlipidemia, unspecified: Secondary | ICD-10-CM

## 2012-09-07 LAB — LIPID PANEL
Cholesterol: 170 mg/dL (ref 0–200)
HDL: 36.5 mg/dL — ABNORMAL LOW (ref >39.00)
LDL Cholesterol: 94 mg/dL (ref 0–99)
Total CHOL/HDL Ratio: 5
Triglycerides: 199 mg/dL — ABNORMAL HIGH (ref 0.0–149.0)
VLDL: 39.8 mg/dL (ref 0.0–40.0)

## 2012-09-07 LAB — COMPREHENSIVE METABOLIC PANEL
ALT: 21 U/L (ref 0–53)
AST: 19 U/L (ref 0–37)
Albumin: 3.4 g/dL — ABNORMAL LOW (ref 3.5–5.2)
Alkaline Phosphatase: 83 U/L (ref 39–117)
BUN: 21 mg/dL (ref 6–23)
CO2: 28 mEq/L (ref 19–32)
Calcium: 9.1 mg/dL (ref 8.4–10.5)
Chloride: 105 mEq/L (ref 96–112)
Creatinine, Ser: 1.7 mg/dL — ABNORMAL HIGH (ref 0.4–1.5)
GFR: 44.07 mL/min — ABNORMAL LOW (ref >60.00)
Glucose, Bld: 158 mg/dL — ABNORMAL HIGH (ref 70–99)
Potassium: 4.6 mEq/L (ref 3.5–5.1)
Sodium: 138 mEq/L (ref 135–145)
Total Bilirubin: 0.9 mg/dL (ref 0.3–1.2)
Total Protein: 6.3 g/dL (ref 6.0–8.3)

## 2012-09-07 LAB — PSA: PSA: 0.57 ng/mL (ref 0.10–4.00)

## 2012-09-14 ENCOUNTER — Encounter: Payer: Self-pay | Admitting: Family Medicine

## 2012-09-14 ENCOUNTER — Ambulatory Visit (INDEPENDENT_AMBULATORY_CARE_PROVIDER_SITE_OTHER): Payer: BC Managed Care – PPO | Admitting: Family Medicine

## 2012-09-14 VITALS — BP 124/82 | HR 84 | Temp 97.6°F | Ht 71.0 in | Wt 295.0 lb

## 2012-09-14 DIAGNOSIS — R739 Hyperglycemia, unspecified: Secondary | ICD-10-CM | POA: Insufficient documentation

## 2012-09-14 DIAGNOSIS — E119 Type 2 diabetes mellitus without complications: Secondary | ICD-10-CM

## 2012-09-14 DIAGNOSIS — R030 Elevated blood-pressure reading, without diagnosis of hypertension: Secondary | ICD-10-CM

## 2012-09-14 DIAGNOSIS — E785 Hyperlipidemia, unspecified: Secondary | ICD-10-CM

## 2012-09-14 DIAGNOSIS — R7309 Other abnormal glucose: Secondary | ICD-10-CM

## 2012-09-14 DIAGNOSIS — Z0000 Encounter for general adult medical examination without abnormal findings: Secondary | ICD-10-CM

## 2012-09-14 DIAGNOSIS — R7989 Other specified abnormal findings of blood chemistry: Secondary | ICD-10-CM | POA: Insufficient documentation

## 2012-09-14 DIAGNOSIS — R799 Abnormal finding of blood chemistry, unspecified: Secondary | ICD-10-CM

## 2012-09-14 LAB — BASIC METABOLIC PANEL
BUN: 18 mg/dL (ref 6–23)
CO2: 28 mEq/L (ref 19–32)
Calcium: 9 mg/dL (ref 8.4–10.5)
Chloride: 104 mEq/L (ref 96–112)
Creatinine, Ser: 1.7 mg/dL — ABNORMAL HIGH (ref 0.4–1.5)
GFR: 46.53 mL/min — ABNORMAL LOW (ref >60.00)
Glucose, Bld: 202 mg/dL — ABNORMAL HIGH (ref 70–99)
Potassium: 4.3 mEq/L (ref 3.5–5.1)
Sodium: 139 mEq/L (ref 135–145)

## 2012-09-14 LAB — HEMOGLOBIN A1C: Hgb A1c MFr Bld: 6.6 % — ABNORMAL HIGH (ref 4.6–6.5)

## 2012-09-14 MED ORDER — GLYBURIDE 2.5 MG PO TABS: 2.5000 mg | ORAL_TABLET | Freq: Every day | ORAL | Status: DC

## 2012-09-14 MED ORDER — GLYBURIDE 2.5 MG PO TABS: 2.5000 mg | ORAL_TABLET | Freq: Two times a day (BID) | ORAL | Status: DC

## 2012-09-14 NOTE — Patient Instructions (Addendum)
Good to see you, Richard Giles. We will call you with your lab results from today.  Please stop by to see Bonita Quin or Shirlee Limerick on your way out to set up your referral.

## 2012-09-14 NOTE — Addendum Note (Signed)
Addended by: Dianne Dun on: 09/14/2012 01:48 PM   Modules accepted: Orders

## 2012-09-14 NOTE — Addendum Note (Signed)
Addended by: Dianne Dun on: 09/14/2012 01:46 PM   Modules accepted: Orders

## 2012-09-14 NOTE — Progress Notes (Signed)
Subjective:    Patient ID: Richard Giles, male    DOB: 19-Aug-1960, 52 y.o.   MRN: [id number]  HPI  Very pleasant 52 yo male here for CPX.  Elevated cr-  Does not take any NSAIDs.  Denies any supplements or protein drinks. Lab Results  Component Value Date   CREATININE 1.7* 09/07/2012  No family h/o kidney disease.  No LE edema.  HLD-  On simvastatin 20 mg daily. Denies any myalgias.  Obesity- weight has been going up.  Admits to eating a lot of carbohydrates. Wt Readings from Last 3 Encounters:  09/14/12 295 lb (133.811 kg)  09/02/12 298 lb (135.172 kg)  03/14/12 284 lb (128.822 kg)   Elevated fasting glucose (he thinks this was fasting)- 158.  Denies increased thirst or urination.  Only grandmother had DM that he is aware of.   Wt Readings from Last 3 Encounters:  09/14/12 295 lb (133.811 kg)  09/02/12 298 lb (135.172 kg)  03/14/12 284 lb (128.822 kg)      Patient Active Problem List   Diagnosis Date Noted  . Elevated serum creatinine 09/14/2012  . Hyperlipidemia 07/06/2011  . Routine general medical examination at a health care facility 06/25/2011  . Allergic state   . GOUT 03/07/2008  . SLEEP DISORDER 03/07/2008  . ELEVATED BLOOD PRESSURE WITHOUT DIAGNOSIS OF HYPERTENSION 03/07/2008  . HEMORRHOIDS, HX OF 03/07/2008  . ALLERGY, ENVIRONMENTAL 07/25/2007   Past Medical History  Diagnosis Date  . Allergy     allergy workup with Dr. Stevphen Rochester : Maybe trees Did not receive shots per patient  . Chronic cough onset 2005/ resolved on ICS July 19,2011    Sinus CT negative 07/08/2009 negative  . Chronic bronchitis   . GERD (gastroesophageal reflux disease)   . Hyperlipidemia   . Restless leg syndrome   . Sleep apnea     does not have to use cpap   Past Surgical History  Procedure Laterality Date  . Foot surgery  09/03/2003    Left foot osteophye removal (Dr. August Saucer)  . Septoplasty  1983    at 52 years of age  . Bialteral inguinal hernia  1967   History   Substance Use Topics  . Smoking status: Never Smoker   . Smokeless tobacco: Former Neurosurgeon    Quit date: 02/23/1993  . Alcohol Use: No   Family History  Problem Relation Age of Onset  . Hypertension Father   . Diabetes Maternal Grandmother   . Depression Neg Hx   . Drug abuse Neg Hx   . Stroke Neg Hx   . Colon cancer Neg Hx   . Stomach cancer Neg Hx    Allergies  Allergen Reactions  . Bee Venom   . Codeine Diarrhea and Nausea And Vomiting   Current Outpatient Prescriptions on File Prior to Visit  Medication Sig Dispense Refill  . acetaminophen (TYLENOL) 500 MG tablet Take 500 mg by mouth every 6 (six) hours as needed.        Marland Kitchen albuterol (VENTOLIN HFA) 108 (90 BASE) MCG/ACT inhaler 2 puffs inhaled every 4-6 hours as needed for wheeze      . allopurinol (ZYLOPRIM) 300 MG tablet TAKE ONE (1) TABLET BY MOUTH EACH DAY  30 tablet  3  . atorvastatin (LIPITOR) 20 MG tablet TAKE ONE (1) TABLET BY MOUTH EVERY DAY  90 tablet  0  . beclomethasone (QVAR) 80 MCG/ACT inhaler Take 2 puffs first thing in am and then another 2 puffs about  12 hours later.  1 Inhaler  11  . clotrimazole-betamethasone (LOTRISONE) cream Apply topically 2 (two) times daily.  30 g  0  . cyclobenzaprine (FLEXERIL) 10 MG tablet Take 1 tablet (10 mg total) by mouth at bedtime as needed.  30 tablet  0  . dextromethorphan (DELSYM) 30 MG/5ML liquid Take 60 mg by mouth as needed.        . famotidine (PEPCID) 20 MG tablet Take 20 mg by mouth at bedtime as needed.      . fexofenadine (ALLEGRA) 180 MG tablet Take 180 mg by mouth daily as needed.       Marland Kitchen ibuprofen (ADVIL,MOTRIN) 200 MG tablet Take 200 mg by mouth every 6 (six) hours as needed.       No current facility-administered medications on file prior to visit.   The PMH, PSH, Social History, Family History, Medications, and allergies have been reviewed in [facility], and have been updated if relevant.  Review of Systems See HPI Patient reports no  vision/ hearing  changes,anorexia, fever ,adenopathy, persistant / recurrent hoarseness, swallowing issues, chest pain, edema, hemoptysis, dyspnea(rest, exertional, paroxysmal nocturnal), gastrointestinal  bleeding (melena, rectal bleeding), abdominal pain, excessive heart burn, GU symptoms(dysuria, hematuria, pyuria, voiding/incontinence  Issues) syncope, focal weakness, severe memory loss, concerning skin lesions, depression, anxiety, abnormal bruising/bleeding, major joint swelling.       Objective:   Physical Exam BP 124/82  Pulse 84  Temp(Src) 97.6 F (36.4 C)  Ht 5\' 11"  (1.803 m)  Wt 295 lb (133.811 kg)  BMI 41.16 kg/m2 General:  overweght male in NAD Eyes:  PERRL Ears:  External ear exam shows no significant lesions or deformities.  Otoscopic examination reveals clear canals, tympanic membranes are intact bilaterally without bulging, retraction, inflammation or discharge. Hearing is grossly normal bilaterally. Nose:  External nasal examination shows no deformity or inflammation. Nasal mucosa are pink and moist without lesions or exudates. Mouth:  Oral mucosa and oropharynx without lesions or exudates.  Teeth in good repair. Neck:  no carotid bruit or thyromegaly no cervical or supraclavicular lymphadenopathy  Lungs:  Normal respiratory effort, chest expands symmetrically. Lungs are clear to auscultation, no crackles or wheezes. Heart:  Normal rate and regular rhythm. S1 and S2 normal without gallop, murmur, click, rub or other extra sounds. Abdomen:  Bowel sounds positive,abdomen soft and non-tender without masses, organomegaly or hernias noted. Pulses:  R and L posterior tibial pulses are full and equal bilaterally  Extremities:  no edema  Neuro:  CNII-XII intact, normal gait.    Assessment & Plan:   1. Elevated serum creatinine New.  Concerning given young age and no NSAID or diurectic use. Will refer to renal. The patient indicates understanding of these issues and agrees with the plan.  -  Basic Metabolic Panel - Ambulatory referral to Nephrology  2. Routine general medical examination at a health care facility Reviewed preventive care protocols, scheduled due services, and updated immunizations Discussed nutrition, exercise, diet, and healthy lifestyle.   3. Hyperlipidemia Stable on current dose of simvastatin. No changes.  4. ELEVATED BLOOD PRESSURE WITHOUT DIAGNOSIS OF HYPERTENSION Normotensive today.  5. Blood glucose elevated New- ?new onset DM although he cannot remember if he was truly fasting. Check a1c today. Copy of eat right handout given.  Based on results, may need oral diabetes agent, diabetes nutrition referral. The patient indicates understanding of these issues and agrees with the plan.  - Hemoglobin A1C

## 2012-10-17 ENCOUNTER — Other Ambulatory Visit: Payer: Self-pay | Admitting: *Deleted

## 2012-10-17 DIAGNOSIS — E119 Type 2 diabetes mellitus without complications: Secondary | ICD-10-CM

## 2012-10-17 MED ORDER — ATORVASTATIN CALCIUM 20 MG PO TABS: ORAL_TABLET | ORAL | Status: DC

## 2012-10-17 MED ORDER — GLYBURIDE 2.5 MG PO TABS: 2.5000 mg | ORAL_TABLET | Freq: Every day | ORAL | Status: DC

## 2012-10-18 ENCOUNTER — Encounter: Payer: BC Managed Care – PPO | Attending: Family Medicine

## 2012-10-18 VITALS — Ht 71.0 in | Wt 275.4 lb

## 2012-10-18 DIAGNOSIS — Z713 Dietary counseling and surveillance: Secondary | ICD-10-CM | POA: Insufficient documentation

## 2012-10-18 DIAGNOSIS — E119 Type 2 diabetes mellitus without complications: Secondary | ICD-10-CM | POA: Insufficient documentation

## 2012-10-21 ENCOUNTER — Encounter: Payer: Self-pay | Admitting: Family Medicine

## 2012-10-21 NOTE — Patient Instructions (Signed)
Goals:  Follow Diabetes Meal Plan as instructed  Eat 3 meals and 2 snacks, every 3-5 hrs  Limit carbohydrate intake to 60-75 grams carbohydrate/meal  Limit carbohydrate intake to 0-30 grams carbohydrate/snack  Add lean protein foods to meals/snacks  Monitor glucose levels as instructed by your doctor  Aim for 30 mins of physical activity daily  Bring food record and glucose log to your next nutrition visit   

## 2012-10-21 NOTE — Progress Notes (Signed)
Patient was seen on 10/18/12 for the first of a series of three diabetes self-management courses at the [facility].   Current HbA1c: 6.6% on 09/14/12  The following learning objectives were met by the patient during this course:   Defines the role of glucose and insulin  Identifies type of diabetes and pathophysiology  Defines the diagnostic criteria for diabetes and prediabetes  States the risk factors for Type 2 Diabetes  States the symptoms of Type 2 Diabetes  Defines Type 2 Diabetes treatment goals  Defines Type 2 Diabetes treatment options  States the rationale for glucose monitoring  Identifies A1C, glucose targets, and testing times  Identifies proper sharps disposal  Defines the purpose of a diabetes food plan  Identifies carbohydrate food groups  Defines effects of carbohydrate foods on glucose levels  Identifies carbohydrate choices/grams/food labels  States benefits of physical activity and effect on glucose  Review of suggested activity guidelines  Handouts given during class include:  Type 2 Diabetes: Basics Book  My Food Plan Book  Food and Activity Log  Your patient has identified their diabetes self-care support plan as:  [facility] support group  Continued diabetes education  Follow-Up Plan: Attend core 2 and core 3

## 2012-11-11 ENCOUNTER — Other Ambulatory Visit: Payer: Self-pay | Admitting: *Deleted

## 2012-11-11 MED ORDER — ALLOPURINOL 300 MG PO TABS: ORAL_TABLET | ORAL | Status: DC

## 2012-11-28 ENCOUNTER — Other Ambulatory Visit: Payer: Self-pay | Admitting: Nephrology

## 2012-11-28 ENCOUNTER — Encounter: Payer: Self-pay | Admitting: Family Medicine

## 2012-11-28 DIAGNOSIS — R319 Hematuria, unspecified: Secondary | ICD-10-CM

## 2012-11-28 DIAGNOSIS — R809 Proteinuria, unspecified: Secondary | ICD-10-CM

## 2012-12-06 ENCOUNTER — Encounter: Payer: BC Managed Care – PPO | Attending: Family Medicine

## 2012-12-06 DIAGNOSIS — E119 Type 2 diabetes mellitus without complications: Secondary | ICD-10-CM | POA: Insufficient documentation

## 2012-12-06 DIAGNOSIS — Z713 Dietary counseling and surveillance: Secondary | ICD-10-CM | POA: Insufficient documentation

## 2012-12-12 ENCOUNTER — Ambulatory Visit
Admission: RE | Admit: 2012-12-12 | Discharge: 2012-12-12 | Disposition: A | Discharge status: Home / Self Care | Payer: BC Managed Care – PPO | Source: Ambulatory Visit | Attending: Nephrology | Admitting: Nephrology

## 2012-12-12 DIAGNOSIS — R809 Proteinuria, unspecified: Secondary | ICD-10-CM

## 2012-12-12 DIAGNOSIS — R319 Hematuria, unspecified: Secondary | ICD-10-CM

## 2012-12-15 ENCOUNTER — Ambulatory Visit: Payer: BC Managed Care – PPO | Admitting: Family Medicine

## 2012-12-21 DIAGNOSIS — E119 Type 2 diabetes mellitus without complications: Secondary | ICD-10-CM

## 2012-12-21 NOTE — Progress Notes (Signed)
Patient was seen on 12/21/12 for the third of a series of three diabetes self-management courses at the [facility]. The following learning objectives were met by the patient during this class:    State the amount of activity recommended for healthy living   Describe activities suitable for individual needs   Identify ways to regularly incorporate activity into daily life   Identify barriers to activity and ways to over come these barriers  Identify diabetes medications being personally used and their primary action for lowering glucose and possible side effects   Describe role of stress on blood glucose and develop strategies to address psychosocial issues   Identify diabetes complications and ways to prevent them  Explain how to manage diabetes during illness   Evaluate success in meeting personal goal   Establish 2-3 goals that they will plan to diligently work on until they return for the free 8-month follow-up visit  Your patient has established the following 4 month goals in their individualized success plan:  Reduce fat in my diet by eating less fried foods  Test glucose at least 2 times  A day 3 days a week  To help manage stress I will do sit ups 3 times a week  Your patient has identified these potential barriers to change:  Travel for work  Putting children first and self second  Your patient has identified their diabetes self-care support plan as  wife  parents  sister

## 2012-12-30 ENCOUNTER — Ambulatory Visit (INDEPENDENT_AMBULATORY_CARE_PROVIDER_SITE_OTHER): Payer: BC Managed Care – PPO | Admitting: Internal Medicine

## 2012-12-30 ENCOUNTER — Encounter: Payer: Self-pay | Admitting: Internal Medicine

## 2012-12-30 VITALS — BP 118/76 | HR 80 | Temp 97.4°F | Wt 238.2 lb

## 2012-12-30 DIAGNOSIS — E1159 Type 2 diabetes mellitus with other circulatory complications: Secondary | ICD-10-CM | POA: Insufficient documentation

## 2012-12-30 DIAGNOSIS — E669 Obesity, unspecified: Secondary | ICD-10-CM

## 2012-12-30 DIAGNOSIS — E119 Type 2 diabetes mellitus without complications: Secondary | ICD-10-CM

## 2012-12-30 LAB — COMPREHENSIVE METABOLIC PANEL
ALT: 26 U/L (ref 0–53)
AST: 26 U/L (ref 0–37)
Albumin: 3.9 g/dL (ref 3.5–5.2)
Alkaline Phosphatase: 83 U/L (ref 39–117)
BUN: 21 mg/dL (ref 6–23)
CO2: 28 mEq/L (ref 19–32)
Calcium: 9.8 mg/dL (ref 8.4–10.5)
Chloride: 104 mEq/L (ref 96–112)
Creatinine, Ser: 1.4 mg/dL (ref 0.4–1.5)
GFR: 56.1 mL/min — ABNORMAL LOW (ref >60.00)
Glucose, Bld: 88 mg/dL (ref 70–99)
Potassium: 5 mEq/L (ref 3.5–5.1)
Sodium: 139 mEq/L (ref 135–145)
Total Bilirubin: 0.9 mg/dL (ref 0.3–1.2)
Total Protein: 6.7 g/dL (ref 6.0–8.3)

## 2012-12-30 LAB — MICROALBUMIN / CREATININE URINE RATIO
Creatinine,U: 76.9 mg/dL
Microalb Creat Ratio: 52.4 mg/g — ABNORMAL HIGH (ref 0.0–30.0)
Microalb, Ur: 40.3 mg/dL — ABNORMAL HIGH (ref 0.0–1.9)

## 2012-12-30 LAB — HEMOGLOBIN A1C: Hgb A1c MFr Bld: 5.5 % (ref 4.6–6.5)

## 2012-12-30 NOTE — Assessment & Plan Note (Signed)
Will check A1C and microalbumin today If lower, will likely d/c Glyburide especially if he is having hypoglycemia Foot exam today Continue to work on diet and exercise

## 2012-12-30 NOTE — Assessment & Plan Note (Signed)
Has lost 58 lbs in the last 3 months Continue with low carb, portion control, and walking daily

## 2012-12-30 NOTE — Progress Notes (Signed)
Subjective:    Patient ID: Richard Giles, male    DOB: 02-03-1961, 52 y.o.   MRN: [id number]  HPI  Pt presents to the clinic today for 3 month followup of new onset diabetes. His A1C in 08/2012 was 6.6%. He was started on Glyburide daily (held off on meformin d/t elevated creatinine. He was referred to nephrology at that time). He was referred to diabetes education, given a monitor to test his sugars with. He is not checking his sugars but he does report he has multiple lows, feelings of jitteriness and shakiness. He has lost 58 lbs in the last 3 months. He is walking 10 miles per day.  Review of Systems      Past Medical History  Diagnosis Date  . Allergy     allergy workup with Dr. Stevphen Rochester : Maybe trees Did not receive shots per patient  . Chronic cough onset 2005/ resolved on ICS July 19,2011    Sinus CT negative 07/08/2009 negative  . Chronic bronchitis   . GERD (gastroesophageal reflux disease)   . Hyperlipidemia   . Restless leg syndrome   . Sleep apnea     does not have to use cpap  . Diabetes mellitus without complication     Current Outpatient Prescriptions  Medication Sig Dispense Refill  . acetaminophen (TYLENOL) 500 MG tablet Take 500 mg by mouth every 6 (six) hours as needed.        Marland Kitchen albuterol (VENTOLIN HFA) 108 (90 BASE) MCG/ACT inhaler 2 puffs inhaled every 4-6 hours as needed for wheeze      . allopurinol (ZYLOPRIM) 300 MG tablet TAKE ONE (1) TABLET BY MOUTH EACH DAY  30 tablet  3  . atorvastatin (LIPITOR) 20 MG tablet TAKE ONE (1) TABLET BY MOUTH EVERY DAY  90 tablet  0  . beclomethasone (QVAR) 80 MCG/ACT inhaler Take 2 puffs first thing in am and then another 2 puffs about 12 hours later.  1 Inhaler  11  . clotrimazole-betamethasone (LOTRISONE) cream Apply topically 2 (two) times daily.  30 g  0  . cyclobenzaprine (FLEXERIL) 10 MG tablet Take 1 tablet (10 mg total) by mouth at bedtime as needed.  30 tablet  0  . dextromethorphan (DELSYM) 30 MG/5ML liquid  Take 60 mg by mouth as needed.        . famotidine (PEPCID) 20 MG tablet Take 20 mg by mouth at bedtime as needed.      . fexofenadine (ALLEGRA) 180 MG tablet Take 180 mg by mouth daily as needed.       . glyBURIDE (DIABETA) 2.5 MG tablet Take 1 tablet (2.5 mg total) by mouth daily with breakfast.  30 tablet  5  . ibuprofen (ADVIL,MOTRIN) 200 MG tablet Take 200 mg by mouth every 6 (six) hours as needed.       No current facility-administered medications for this visit.    Allergies  Allergen Reactions  . Bee Venom   . Codeine Diarrhea and Nausea And Vomiting    Family History  Problem Relation Age of Onset  . Hypertension Father   . Diabetes Maternal Grandmother   . Depression Neg Hx   . Drug abuse Neg Hx   . Stroke Neg Hx   . Colon cancer Neg Hx   . Stomach cancer Neg Hx     History   Social History  . Marital Status: Married    Spouse Name: N/A    Number of Children: N/A  . Years  of Education: N/A   Occupational History  . Not on file.   Social History Main Topics  . Smoking status: Never Smoker   . Smokeless tobacco: Former Neurosurgeon    Quit date: 02/23/1993  . Alcohol Use: No  . Drug Use: No  . Sexual Activity: Not on file   Other Topics Concern  . Not on file   Social History Narrative  . No narrative on file     Constitutional: Denies fever, malaise, fatigue, headache or abrupt weight changes.  Respiratory: Denies difficulty breathing, shortness of breath, cough or sputum production.   Cardiovascular: Denies chest pain, chest tightness, palpitations or swelling in the hands or feet. .  Skin: Denies redness, rashes, lesions or ulcercations.  Neurological: Denies dizziness, difficulty with memory, difficulty with speech or problems with balance and coordination.   No other specific complaints in a complete review of systems (except as listed in HPI above).  Objective:   Physical Exam   BP 118/76  Pulse 80  Temp(Src) 97.4 F (36.3 C) (Oral)  Wt 238  lb 4 oz (108.069 kg)  SpO2 97% Wt Readings from Last 3 Encounters:  12/30/12 238 lb 4 oz (108.069 kg)  10/21/12 275 lb 6.4 oz (124.921 kg)  09/14/12 295 lb (133.811 kg)    General: Appears his stated age, overweight but well developed, well nourished in NAD. Skin: Warm, dry and intact. No rashes, lesions or ulcerations noted. Cardiovascular: Normal rate and rhythm. S1,S2 noted.  No murmur, rubs or gallops noted. No JVD or BLE edema. No carotid bruits noted. Pulmonary/Chest: Normal effort and positive vesicular breath sounds. No respiratory distress. No wheezes, rales or ronchi noted.  Neurological: Alert and oriented. Cranial nerves II-XII intact. Coordination normal. +DTRs bilaterally.   BMET    Component Value Date/Time   NA 139 09/14/2012 0906   K 4.3 09/14/2012 0906   CL 104 09/14/2012 0906   CO2 28 09/14/2012 0906   GLUCOSE 202* 09/14/2012 0906   BUN 18 09/14/2012 0906   CREATININE 1.7* 09/14/2012 0906   CALCIUM 9.0 09/14/2012 0906    Lipid Panel     Component Value Date/Time   CHOL 170 09/07/2012 0748   TRIG 199.0* 09/07/2012 0748   HDL 36.50* 09/07/2012 0748   CHOLHDL 5 09/07/2012 0748   VLDL 39.8 09/07/2012 0748   LDLCALC 94 09/07/2012 0748    CBC No results found for this basename: wbc, rbc, hgb, hct, plt, mcv, mch, mchc, rdw, neutrabs, lymphsabs, monoabs, eosabs, basosabs    Hgb A1C Lab Results  Component Value Date   HGBA1C 6.6* 09/14/2012        Assessment & Plan:

## 2012-12-30 NOTE — Progress Notes (Signed)
Pre-visit discussion using our clinic review tool. No additional management support is needed unless otherwise documented below in the visit note.  

## 2012-12-30 NOTE — Patient Instructions (Signed)
Type 2 Diabetes Mellitus, Adult Type 2 diabetes mellitus, often simply referred to as type 2 diabetes, is a long-lasting (chronic) disease. In type 2 diabetes, the pancreas does not make enough insulin (a hormone), the cells are less responsive to the insulin that is made (insulin resistance), or both. Normally, insulin moves sugars from food into the tissue cells. The tissue cells use the sugars for energy. The lack of insulin or the lack of normal response to insulin causes excess sugars to build up in the blood instead of going into the tissue cells. As a result, high blood sugar (hyperglycemia) develops. The effect of high sugar (glucose) levels can cause many complications. Type 2 diabetes was also previously called adult-onset diabetes but it can occur at any age.  RISK FACTORS  A person is predisposed to developing type 2 diabetes if someone in the family has the disease and also has one or more of the following primary risk factors:  Overweight.  An inactive lifestyle.  A history of consistently eating high-calorie foods. Maintaining a normal weight and regular physical activity can reduce the chance of developing type 2 diabetes. SYMPTOMS  A person with type 2 diabetes may not show symptoms initially. The symptoms of type 2 diabetes appear slowly. The symptoms include:  Increased thirst (polydipsia).  Increased urination (polyuria).  Increased urination during the night (nocturia).  Weight loss. This weight loss may be rapid.  Frequent, recurring infections.  Tiredness (fatigue).  Weakness.  Vision changes, such as blurred vision.  Fruity smell to your breath.  Abdominal pain.  Nausea or vomiting.  Cuts or bruises which are slow to heal.  Tingling or numbness in the hands or feet. DIAGNOSIS Type 2 diabetes is frequently not diagnosed until complications of diabetes are present. Type 2 diabetes is diagnosed when symptoms or complications are present and when blood  glucose levels are increased. Your blood glucose level may be checked by one or more of the following blood tests:  A fasting blood glucose test. You will not be allowed to eat for at least 8 hours before a blood sample is taken.  A random blood glucose test. Your blood glucose is checked at any time of the day regardless of when you ate.  A hemoglobin A1c blood glucose test. A hemoglobin A1c test provides information about blood glucose control over the previous 3 months.  An oral glucose tolerance test (OGTT). Your blood glucose is measured after you have not eaten (fasted) for 2 hours and then after you drink a glucose-containing beverage. TREATMENT   You may need to take insulin or diabetes medicine daily to keep blood glucose levels in the desired range.  You will need to match insulin dosing with exercise and healthy food choices. The treatment goal is to maintain the before meal blood sugar (preprandial glucose) level at 70 130 mg/dL. HOME CARE INSTRUCTIONS   Have your hemoglobin A1c level checked twice a year.  Perform daily blood glucose monitoring as directed by your caregiver.  Monitor urine ketones when you are ill and as directed by your caregiver.  Take your diabetes medicine or insulin as directed by your caregiver to maintain your blood glucose levels in the desired range.  Never run out of diabetes medicine or insulin. It is needed every day.  Adjust insulin based on your intake of carbohydrates. Carbohydrates can raise blood glucose levels but need to be included in your diet. Carbohydrates provide vitamins, minerals, and fiber which are an essential part of   a healthy diet. Carbohydrates are found in fruits, vegetables, whole grains, dairy products, legumes, and foods containing added sugars.    Eat healthy foods. Alternate 3 meals with 3 snacks.  Lose weight if overweight.  Carry a medical alert card or wear your medical alert jewelry.  Carry a 15 gram  carbohydrate snack with you at all times to treat low blood glucose (hypoglycemia). Some examples of 15 gram carbohydrate snacks include:  Glucose tablets, 3 or 4   Glucose gel, 15 gram tube  Raisins, 2 tablespoons (24 grams)  Jelly beans, 6  Animal crackers, 8  Regular pop, 4 ounces (120 mL)  Gummy treats, 9  Recognize hypoglycemia. Hypoglycemia occurs with blood glucose levels of 70 mg/dL and below. The risk for hypoglycemia increases when fasting or skipping meals, during or after intense exercise, and during sleep. Hypoglycemia symptoms can include:  Tremors or shakes.  Decreased ability to concentrate.  Sweating.  Increased heart rate.  Headache.  Dry mouth.  Hunger.  Irritability.  Anxiety.  Restless sleep.  Altered speech or coordination.  Confusion.  Treat hypoglycemia promptly. If you are alert and able to safely swallow, follow the 15:15 rule:  Take 15 20 grams of rapid-acting glucose or carbohydrate. Rapid-acting options include glucose gel, glucose tablets, or 4 ounces (120 mL) of fruit juice, regular soda, or low fat milk.  Check your blood glucose level 15 minutes after taking the glucose.  Take 15 20 grams more of glucose if the repeat blood glucose level is still 70 mg/dL or below.  Eat a meal or snack within 1 hour once blood glucose levels return to normal.    Be alert to polyuria and polydipsia which are early signs of hyperglycemia. An early awareness of hyperglycemia allows for prompt treatment. Treat hyperglycemia as directed by your caregiver.  Engage in at least 150 minutes of moderate-intensity physical activity a week, spread over at least 3 days of the week or as directed by your caregiver. In addition, you should engage in resistance exercise at least 2 times a week or as directed by your caregiver.  Adjust your medicine and food intake as needed if you start a new exercise or sport.  Follow your sick day plan at any time you  are unable to eat or drink as usual.  Avoid tobacco use.  Limit alcohol intake to no more than 1 drink per day for nonpregnant women and 2 drinks per day for men. You should drink alcohol only when you are also eating food. Talk with your caregiver whether alcohol is safe for you. Tell your caregiver if you drink alcohol several times a week.  Follow up with your caregiver regularly.  Schedule an eye exam soon after the diagnosis of type 2 diabetes and then annually.  Perform daily skin and foot care. Examine your skin and feet daily for cuts, bruises, redness, nail problems, bleeding, blisters, or sores. A foot exam by a caregiver should be done annually.  Brush your teeth and gums at least twice a day and floss at least once a day. Follow up with your dentist regularly.  Share your diabetes management plan with your workplace or school.  Stay up-to-date with immunizations.  Learn to manage stress.  Obtain ongoing diabetes education and support as needed.  Participate in, or seek rehabilitation as needed to maintain or improve independence and quality of life. Request a physical or occupational therapy referral if you are having foot or hand numbness or difficulties with grooming,   dressing, eating, or physical activity. SEEK MEDICAL CARE IF:   You are unable to eat food or drink fluids for more than 6 hours.  You have nausea and vomiting for more than 6 hours.  Your blood glucose level is over 240 mg/dL.  There is a change in mental status.  You develop an additional serious illness.  You have diarrhea for more than 6 hours.  You have been sick or have had a fever for a couple of days and are not getting better.  You have pain during any physical activity.  SEEK IMMEDIATE MEDICAL CARE IF:  You have difficulty breathing.  You have moderate to large ketone levels. MAKE SURE YOU:  Understand these instructions.  Will watch your condition.  Will get help right away if  you are not doing well or get worse. Document Released: 02/09/2005 Document Revised: 11/04/2011 Document Reviewed: 09/08/2011 Exit[employer] Patient Information 2014 [employer].  

## 2013-01-17 ENCOUNTER — Other Ambulatory Visit: Payer: Self-pay | Admitting: *Deleted

## 2013-01-17 MED ORDER — ATORVASTATIN CALCIUM 20 MG PO TABS: ORAL_TABLET | ORAL | Status: DC

## 2013-01-23 ENCOUNTER — Other Ambulatory Visit: Payer: Self-pay | Admitting: Family Medicine

## 2013-01-23 ENCOUNTER — Telehealth: Payer: Self-pay | Admitting: Family Medicine

## 2013-01-23 ENCOUNTER — Telehealth: Payer: Self-pay

## 2013-01-23 ENCOUNTER — Encounter: Payer: Self-pay | Admitting: Family Medicine

## 2013-01-23 MED ORDER — PREDNISONE 10 MG PO TABS: ORAL_TABLET | ORAL | Status: DC

## 2013-01-23 NOTE — Telephone Encounter (Signed)
rx sent to pharmacy by e-script Spoke with patient and advised results   

## 2013-01-23 NOTE — Telephone Encounter (Signed)
Okay to send Rx for #14 x 0 to have on hand just in case of a flare of his asthma

## 2013-01-23 NOTE — Telephone Encounter (Signed)
Prednisone 10 MG (14 pills) prescribed by Dr. Sherene Sires. Dr. Dayton Martes was going to take over prescribing my meds for treating my COPD flair ups. Just started a flair up yesterday and I am going to be traveling out of town on 01/25/13. I need a refill before I go. I called in the medicine to [facility] this morning Rx# [id number]. Any help getting this prescription filled would be greatly appreciated! Thanks!

## 2013-01-23 NOTE — Telephone Encounter (Signed)
Called pt.  Would restart pred given his hx, he can f/u prn.  rx sent to [facility].

## 2013-01-24 ENCOUNTER — Other Ambulatory Visit: Payer: Self-pay | Admitting: Internal Medicine

## 2013-02-01 ENCOUNTER — Encounter (INDEPENDENT_AMBULATORY_CARE_PROVIDER_SITE_OTHER): Payer: BC Managed Care – PPO | Admitting: Ophthalmology

## 2013-02-01 DIAGNOSIS — H43819 Vitreous degeneration, unspecified eye: Secondary | ICD-10-CM

## 2013-02-01 DIAGNOSIS — E11319 Type 2 diabetes mellitus with unspecified diabetic retinopathy without macular edema: Secondary | ICD-10-CM

## 2013-02-01 DIAGNOSIS — H2510 Age-related nuclear cataract, unspecified eye: Secondary | ICD-10-CM

## 2013-02-01 DIAGNOSIS — E1139 Type 2 diabetes mellitus with other diabetic ophthalmic complication: Secondary | ICD-10-CM

## 2013-03-23 ENCOUNTER — Other Ambulatory Visit: Payer: Self-pay | Admitting: Family Medicine

## 2013-04-03 ENCOUNTER — Ambulatory Visit: Payer: BC Managed Care – PPO | Admitting: Family Medicine

## 2013-04-05 ENCOUNTER — Ambulatory Visit (INDEPENDENT_AMBULATORY_CARE_PROVIDER_SITE_OTHER): Payer: 59 | Admitting: Family Medicine

## 2013-04-05 DIAGNOSIS — E119 Type 2 diabetes mellitus without complications: Secondary | ICD-10-CM

## 2013-04-05 DIAGNOSIS — R7989 Other specified abnormal findings of blood chemistry: Secondary | ICD-10-CM

## 2013-04-05 DIAGNOSIS — R799 Abnormal finding of blood chemistry, unspecified: Secondary | ICD-10-CM

## 2013-04-05 DIAGNOSIS — E785 Hyperlipidemia, unspecified: Secondary | ICD-10-CM

## 2013-04-05 DIAGNOSIS — E669 Obesity, unspecified: Secondary | ICD-10-CM

## 2013-04-05 LAB — LIPID PANEL
Cholesterol: 135 mg/dL (ref 0–200)
HDL: 41.4 mg/dL (ref >39.00)
LDL Cholesterol: 76 mg/dL (ref 0–99)
Total CHOL/HDL Ratio: 3
Triglycerides: 90 mg/dL (ref 0.0–149.0)
VLDL: 18 mg/dL (ref 0.0–40.0)

## 2013-04-05 LAB — COMPREHENSIVE METABOLIC PANEL
ALT: 21 U/L (ref 0–53)
AST: 24 U/L (ref 0–37)
Albumin: 3.9 g/dL (ref 3.5–5.2)
Alkaline Phosphatase: 73 U/L (ref 39–117)
BUN: 21 mg/dL (ref 6–23)
CO2: 28 mEq/L (ref 19–32)
Calcium: 9.4 mg/dL (ref 8.4–10.5)
Chloride: 105 mEq/L (ref 96–112)
Creatinine, Ser: 1.4 mg/dL (ref 0.4–1.5)
GFR: 58.43 mL/min — ABNORMAL LOW (ref >60.00)
Glucose, Bld: 92 mg/dL (ref 70–99)
Potassium: 4.1 mEq/L (ref 3.5–5.1)
Sodium: 141 mEq/L (ref 135–145)
Total Bilirubin: 1.4 mg/dL — ABNORMAL HIGH (ref 0.3–1.2)
Total Protein: 6.8 g/dL (ref 6.0–8.3)

## 2013-04-05 LAB — HEMOGLOBIN A1C: Hgb A1c MFr Bld: 5.8 % (ref 4.6–6.5)

## 2013-04-05 NOTE — Assessment & Plan Note (Signed)
Diet controlled!  Congratulated him on weight loss and new lifestyle. Will check a1c today and again in 3 months, if remains controlled, will take diabetes off of his problem list! The patient indicates understanding of these issues and agrees with the plan.

## 2013-04-05 NOTE — Assessment & Plan Note (Signed)
Recheck lipid panel today. Can likely d/c lipitor.

## 2013-04-05 NOTE — Progress Notes (Signed)
Pre-visit discussion using our clinic review tool. No additional management support is needed unless otherwise documented below in the visit note.  

## 2013-04-05 NOTE — Patient Instructions (Signed)
It was great to see you. I am so proud of you-- you look great!  I will call you with your lab results but I bet we can stop your cholesterol medication too.

## 2013-04-05 NOTE — Assessment & Plan Note (Signed)
Improved

## 2013-04-05 NOTE — Progress Notes (Signed)
Subjective:    Patient ID: Richard Giles, male    DOB: [birth year], 53 y.o.   MRN: [id number]  HPI  DM- a1c excellent 3 months ago. Lab Results  Component Value Date   HGBA1C 5.5 12/30/2012   Was on Glyburide 2.5 mg daily which was stopped 3 months ago b/c a1c was so good.  Metformin was d/c'd due to elevated Cr and was referred to renal at that time.  Also referred to diabetic teaching. Doing well- has lost weight approximately 75 pounds.  Feels great!  Walking 10 miles a day.  Wt Readings from Last 3 Encounters:  12/30/12 238 lb 4 oz (108.069 kg)  10/21/12 275 lb 6.4 oz (124.921 kg)  09/14/12 295 lb (133.811 kg)     Lab Results  Component Value Date   CREATININE 1.4 12/30/2012    HLD- on lipitor 20 mg daily. Lab Results  Component Value Date   CHOL 170 09/07/2012   HDL 36.50* 09/07/2012   LDLCALC 94 09/07/2012   LDLDIRECT 169.8 06/25/2011   TRIG 199.0* 09/07/2012   CHOLHDL 5 09/07/2012    Review of Systems    See HPI No CP or SOB No fatigue  Past Medical History  Diagnosis Date  . Allergy     allergy workup with Dr. Caprice Red : Maybe trees Did not receive shots per patient  . Chronic cough onset 2005/ resolved on ICS July 19,2011    Sinus CT negative 07/08/2009 negative  . Chronic bronchitis   . GERD (gastroesophageal reflux disease)   . Hyperlipidemia   . Restless leg syndrome   . Sleep apnea     does not have to use cpap  . Diabetes mellitus without complication     Current Outpatient Prescriptions  Medication Sig Dispense Refill  . acetaminophen (TYLENOL) 500 MG tablet Take 500 mg by mouth every 6 (six) hours as needed.        Marland Kitchen albuterol (VENTOLIN HFA) 108 (90 BASE) MCG/ACT inhaler 2 puffs inhaled every 4-6 hours as needed for wheeze      . allopurinol (ZYLOPRIM) 300 MG tablet TAKE 1 TABLET BY MOUTH DAILY  30 tablet  3  . atorvastatin (LIPITOR) 20 MG tablet TAKE ONE (1) TABLET BY MOUTH EVERY DAY  90 tablet  1  . beclomethasone (QVAR) 80 MCG/ACT inhaler  Take 2 puffs first thing in am and then another 2 puffs about 12 hours later.  1 Inhaler  11  . clotrimazole-betamethasone (LOTRISONE) cream Apply topically 2 (two) times daily.  30 g  0  . cyclobenzaprine (FLEXERIL) 10 MG tablet Take 1 tablet (10 mg total) by mouth at bedtime as needed.  30 tablet  0  . dextromethorphan (DELSYM) 30 MG/5ML liquid Take 60 mg by mouth as needed.        . famotidine (PEPCID) 20 MG tablet Take 20 mg by mouth at bedtime as needed.      . fexofenadine (ALLEGRA) 180 MG tablet Take 180 mg by mouth daily as needed.       . glyBURIDE (DIABETA) 2.5 MG tablet Take 1 tablet (2.5 mg total) by mouth daily with breakfast.  30 tablet  5  . ibuprofen (ADVIL,MOTRIN) 200 MG tablet Take 200 mg by mouth every 6 (six) hours as needed.      . predniSONE (DELTASONE) 10 MG tablet Take 4 x 2 days, 2 x 2 days, 1 x 2 days, with food.  14 tablet  0   No current facility-administered medications  for this visit.    Allergies  Allergen Reactions  . Bee Venom   . Codeine Diarrhea and Nausea And Vomiting    Family History  Problem Relation Age of Onset  . Hypertension Father   . Diabetes Maternal Grandmother   . Depression Neg Hx   . Drug abuse Neg Hx   . Stroke Neg Hx   . Colon cancer Neg Hx   . Stomach cancer Neg Hx     History   Social History  . Marital Status: Married    Spouse Name: N/A    Number of Children: N/A  . Years of Education: N/A   Occupational History  . Not on file.   Social History Main Topics  . Smoking status: Never Smoker   . Smokeless tobacco: Former Systems developer    Quit date: 02/23/1993  . Alcohol Use: No  . Drug Use: No  . Sexual Activity: Not on file   Other Topics Concern  . Not on file   Social History Narrative  . No narrative on file       Objective:   Physical Exam   There were no vitals taken for this visit. Wt Readings from Last 3 Encounters:  12/30/12 238 lb 4 oz (108.069 kg)  10/21/12 275 lb 6.4 oz (124.921 kg)  09/14/12 295  lb (133.811 kg)    General: Appears his stated age, overweight but well developed, well nourished in NAD. Skin: Warm, dry and intact. No rashes, lesions or ulcerations noted. Cardiovascular: Normal rate and rhythm. S1,S2 noted.  No murmur, rubs or gallops noted. No JVD or BLE edema. No carotid bruits noted. Pulmonary/Chest: Normal effort and positive vesicular breath sounds. No respiratory distress. No wheezes, rales or ronchi noted.  Neurological: Alert and oriented. Cranial nerves II-XII intact. Coordination normal. +DTRs bilaterally.     Assessment & Plan:

## 2013-04-07 ENCOUNTER — Telehealth: Payer: Self-pay

## 2013-04-07 ENCOUNTER — Ambulatory Visit: Payer: BC Managed Care – PPO | Admitting: Family Medicine

## 2013-04-07 NOTE — Telephone Encounter (Signed)
Relevant patient education assigned to patient using Emmi. ° °

## 2013-04-26 ENCOUNTER — Encounter: Payer: 59 | Attending: Family Medicine | Admitting: *Deleted

## 2013-04-26 VITALS — Ht 71.0 in | Wt 225.0 lb

## 2013-04-26 DIAGNOSIS — E119 Type 2 diabetes mellitus without complications: Secondary | ICD-10-CM | POA: Insufficient documentation

## 2013-04-26 DIAGNOSIS — Z713 Dietary counseling and surveillance: Secondary | ICD-10-CM | POA: Insufficient documentation

## 2013-04-28 ENCOUNTER — Encounter: Payer: Self-pay | Admitting: *Deleted

## 2013-04-28 NOTE — Progress Notes (Signed)
  Patient was seen on 04/26/13 for their 3 month follow-up as a part of the diabetes self-management courses at the [facility]. The following learning objectives were met by your patient during this course:  Patient self reports the following: Richard Giles has lost approximately 75# since receiving new diagnosis of T2DM. He walks 3 times per day to a total of approximately 195 minutes (75mles) per day.  He has restricted himself to 1500 calories per day. His weight goal is 195#. His A1c has decreased from 6.6% to 5.8% and all glycemic medications have been discontinued. He does not test his glucose.He is aware that AMA recommendation is 1572m/wk. However he is afraid to decrease his walking/increase caloric intake because he has lost and gained weight numerous times in the past. He does not want to do this again. He has discontinued bread and soda from his diet. He notes stress at work being a challenge/struggle for him maintaining such good control.  Diabetes control has improved since diabetes self-management training: Very much Number of days blood glucose is >200: never Last MD appointment for diabetes: 04/05/13 Changes in treatment plan: none Confidence with ability to manage diabetes: High Areas for improvement with diabetes self-care: none Willingness to participate in diabetes support group: not at this time  Follow-Up Plan: Patient to call and schedule as needed.

## 2013-06-29 ENCOUNTER — Other Ambulatory Visit: Payer: Self-pay | Admitting: Family Medicine

## 2013-06-29 DIAGNOSIS — Z125 Encounter for screening for malignant neoplasm of prostate: Secondary | ICD-10-CM

## 2013-06-29 DIAGNOSIS — R7989 Other specified abnormal findings of blood chemistry: Secondary | ICD-10-CM

## 2013-06-29 DIAGNOSIS — E785 Hyperlipidemia, unspecified: Secondary | ICD-10-CM

## 2013-06-29 DIAGNOSIS — E119 Type 2 diabetes mellitus without complications: Secondary | ICD-10-CM

## 2013-07-03 ENCOUNTER — Other Ambulatory Visit (INDEPENDENT_AMBULATORY_CARE_PROVIDER_SITE_OTHER): Payer: 59

## 2013-07-03 DIAGNOSIS — E785 Hyperlipidemia, unspecified: Secondary | ICD-10-CM

## 2013-07-03 DIAGNOSIS — Z125 Encounter for screening for malignant neoplasm of prostate: Secondary | ICD-10-CM

## 2013-07-03 DIAGNOSIS — E119 Type 2 diabetes mellitus without complications: Secondary | ICD-10-CM

## 2013-07-03 DIAGNOSIS — R799 Abnormal finding of blood chemistry, unspecified: Secondary | ICD-10-CM

## 2013-07-03 DIAGNOSIS — R7989 Other specified abnormal findings of blood chemistry: Secondary | ICD-10-CM

## 2013-07-03 LAB — COMPREHENSIVE METABOLIC PANEL
ALT: 25 U/L (ref 0–53)
AST: 24 U/L (ref 0–37)
Albumin: 3.8 g/dL (ref 3.5–5.2)
Alkaline Phosphatase: 64 U/L (ref 39–117)
BUN: 18 mg/dL (ref 6–23)
CO2: 29 mEq/L (ref 19–32)
Calcium: 9.8 mg/dL (ref 8.4–10.5)
Chloride: 104 mEq/L (ref 96–112)
Creatinine, Ser: 1.3 mg/dL (ref 0.4–1.5)
GFR: 62.61 mL/min (ref >60.00)
Glucose, Bld: 98 mg/dL (ref 70–99)
Potassium: 4.7 mEq/L (ref 3.5–5.1)
Sodium: 140 mEq/L (ref 135–145)
Total Bilirubin: 0.9 mg/dL (ref 0.2–1.2)
Total Protein: 6.4 g/dL (ref 6.0–8.3)

## 2013-07-03 LAB — PSA: PSA: 0.71 ng/mL (ref 0.10–4.00)

## 2013-07-03 LAB — LIPID PANEL
Cholesterol: 254 mg/dL — ABNORMAL HIGH (ref 0–200)
HDL: 49.4 mg/dL (ref >39.00)
LDL Cholesterol: 178 mg/dL — ABNORMAL HIGH (ref 0–99)
Total CHOL/HDL Ratio: 5
Triglycerides: 133 mg/dL (ref 0.0–149.0)
VLDL: 26.6 mg/dL (ref 0.0–40.0)

## 2013-07-03 LAB — HEMOGLOBIN A1C: Hgb A1c MFr Bld: 5.8 % (ref 4.6–6.5)

## 2013-07-04 ENCOUNTER — Encounter: Payer: Self-pay | Admitting: Family Medicine

## 2013-07-05 ENCOUNTER — Other Ambulatory Visit: Payer: Self-pay | Admitting: Family Medicine

## 2013-07-05 MED ORDER — ATORVASTATIN CALCIUM 20 MG PO TABS: ORAL_TABLET | ORAL | Status: DC

## 2013-08-02 ENCOUNTER — Other Ambulatory Visit: Payer: Self-pay | Admitting: Family Medicine

## 2013-10-04 ENCOUNTER — Other Ambulatory Visit: Payer: Self-pay | Admitting: Family Medicine

## 2013-10-09 ENCOUNTER — Other Ambulatory Visit: Payer: Self-pay | Admitting: Family Medicine

## 2013-10-09 DIAGNOSIS — E785 Hyperlipidemia, unspecified: Secondary | ICD-10-CM

## 2013-10-11 ENCOUNTER — Encounter: Payer: Self-pay | Admitting: *Deleted

## 2013-10-11 ENCOUNTER — Other Ambulatory Visit (INDEPENDENT_AMBULATORY_CARE_PROVIDER_SITE_OTHER): Payer: 59

## 2013-10-11 DIAGNOSIS — E785 Hyperlipidemia, unspecified: Secondary | ICD-10-CM

## 2013-10-11 LAB — LDL CHOLESTEROL, DIRECT: Direct LDL: 108.5 mg/dL

## 2013-10-31 ENCOUNTER — Ambulatory Visit (INDEPENDENT_AMBULATORY_CARE_PROVIDER_SITE_OTHER): Payer: 59 | Admitting: Family Medicine

## 2013-10-31 ENCOUNTER — Encounter: Payer: Self-pay | Admitting: Family Medicine

## 2013-10-31 VITALS — BP 130/84 | HR 84 | Temp 97.9°F | Wt 256.2 lb

## 2013-10-31 DIAGNOSIS — R21 Rash and other nonspecific skin eruption: Secondary | ICD-10-CM

## 2013-10-31 MED ORDER — PREDNISONE 20 MG PO TABS
ORAL_TABLET | ORAL | Status: DC
Start: 2013-10-31 — End: 2015-04-11

## 2013-10-31 NOTE — Patient Instructions (Signed)
Take the prednisone taper with food and notify us if the rash doesn't improve.  Take care.

## 2013-10-31 NOTE — Progress Notes (Signed)
Pre visit review using our clinic review tool, if applicable. No additional management support is needed unless otherwise documented below in the visit note.  Rash on legs. Blanching red macules on legs. Thought he had chiggers a few weeks ago- it was getting better then more itching on his foot recently, then on his legs.  Now with a lesion on the R wrist and arm. Bilateral, non dermatomal. Still itching. Cortisone didn't help much, temporary relief from the itching.   Meds, vitals, and allergies reviewed.   ROS: See HPI.  Otherwise, noncontributory.  nad No oral lesion, no palmar lesions Itchy maculopapular red blanching lesions on thighs> lower legs, some near the waistline, each only a few mm across, not follicular.  Not pustular.  A few lesions on the R wrist and forearm

## 2013-10-31 NOTE — Assessment & Plan Note (Signed)
Doesn't appear infectious. Unclear source. Nontoxic. pred taper, f/u prn. He agrees.  Steroid cautions given.

## 2013-11-05 ENCOUNTER — Other Ambulatory Visit: Payer: Self-pay | Admitting: Internal Medicine

## 2013-12-11 ENCOUNTER — Other Ambulatory Visit: Payer: Self-pay | Admitting: Family Medicine

## 2014-02-12 ENCOUNTER — Other Ambulatory Visit: Payer: Self-pay | Admitting: Family Medicine

## 2014-02-26 ENCOUNTER — Other Ambulatory Visit: Payer: Self-pay | Admitting: Internal Medicine

## 2014-03-12 ENCOUNTER — Other Ambulatory Visit: Payer: Self-pay | Admitting: Family Medicine

## 2014-03-22 ENCOUNTER — Other Ambulatory Visit (HOSPITAL_COMMUNITY): Payer: Self-pay | Admitting: Nephrology

## 2014-03-22 DIAGNOSIS — R809 Proteinuria, unspecified: Secondary | ICD-10-CM

## 2014-05-02 ENCOUNTER — Other Ambulatory Visit: Payer: Self-pay | Admitting: Radiology

## 2014-05-03 ENCOUNTER — Encounter (HOSPITAL_COMMUNITY): Payer: Self-pay

## 2014-05-03 ENCOUNTER — Ambulatory Visit (HOSPITAL_COMMUNITY)
Admission: RE | Admit: 2014-05-03 | Discharge: 2014-05-03 | Disposition: A | Discharge status: Home / Self Care | Payer: 59 | Source: Ambulatory Visit | Attending: Nephrology | Admitting: Nephrology

## 2014-05-03 DIAGNOSIS — R809 Proteinuria, unspecified: Secondary | ICD-10-CM | POA: Insufficient documentation

## 2014-05-03 DIAGNOSIS — R312 Other microscopic hematuria: Secondary | ICD-10-CM | POA: Diagnosis not present

## 2014-05-03 LAB — PROTIME-INR
INR: 0.93 (ref 0.00–1.49)
Prothrombin Time: 12.5 seconds (ref 11.6–15.2)

## 2014-05-03 LAB — CBC
HCT: 40.5 % (ref 39.0–52.0)
Hemoglobin: 14 g/dL (ref 13.0–17.0)
MCH: 31.2 pg (ref 26.0–34.0)
MCHC: 34.6 g/dL (ref 30.0–36.0)
MCV: 90.2 fL (ref 78.0–100.0)
Platelets: 198 10*3/uL (ref 150–400)
RBC: 4.49 MIL/uL (ref 4.22–5.81)
RDW: 12.7 % (ref 11.5–15.5)
WBC: 8.8 10*3/uL (ref 4.0–10.5)

## 2014-05-03 LAB — APTT: aPTT: 28 seconds (ref 24–37)

## 2014-05-03 MED ORDER — FENTANYL CITRATE 0.05 MG/ML IJ SOLN
INTRAMUSCULAR | Status: AC | PRN
  Administered 2014-05-03: 50 ug via INTRAVENOUS

## 2014-05-03 MED ORDER — LIDOCAINE HCL (PF) 1 % IJ SOLN
INTRAMUSCULAR | Status: AC
  Filled 2014-05-03: qty 10

## 2014-05-03 MED ORDER — MIDAZOLAM HCL 2 MG/2ML IJ SOLN
INTRAMUSCULAR | Status: AC | PRN
  Administered 2014-05-03 (×2): 1 mg via INTRAVENOUS

## 2014-05-03 MED ORDER — FENTANYL CITRATE 0.05 MG/ML IJ SOLN
INTRAMUSCULAR | Status: AC
  Filled 2014-05-03: qty 4

## 2014-05-03 MED ORDER — MIDAZOLAM HCL 2 MG/2ML IJ SOLN
INTRAMUSCULAR | Status: AC
  Filled 2014-05-03: qty 4

## 2014-05-03 MED ORDER — ACETAMINOPHEN 500 MG PO TABS
500.0000 mg | ORAL_TABLET | ORAL | Status: DC | PRN
  Filled 2014-05-03: qty 1

## 2014-05-03 MED ORDER — SODIUM CHLORIDE 0.9 % IV SOLN
INTRAVENOUS | Status: DC
  Administered 2014-05-03: 1000 mL via INTRAVENOUS

## 2014-05-03 NOTE — H&P (Signed)
Chief Complaint: "I am here for a kidney biopsy."  Referring Physician(s): Sanford,Ryan B  History of Present Illness: Richard Giles is a 54 y.o. male with proteinuria, microscopic hematuria and renal insufficiency who has been seen by Nephrology and scheduled today for image guided renal biopsy. He denies any chest pain, shortness of breath or palpitations. He denies any active signs of bleeding or excessive bruising. He denies any recent fever or chills. The patient does have OSA, but does not use a CPAP. He has previously tolerated sedation without complications.    Past Medical History  Diagnosis Date  . Allergy     allergy workup with Dr. Caprice Red : Maybe trees Did not receive shots per patient  . Chronic cough onset 2005/ resolved on ICS July 19,2011    Sinus CT negative 07/08/2009 negative  . Chronic bronchitis   . GERD (gastroesophageal reflux disease)   . Hyperlipidemia   . Restless leg syndrome   . Sleep apnea     does not have to use cpap  . Diabetes mellitus without complication     Past Surgical History  Procedure Laterality Date  . Foot surgery  09/03/2003    Left foot osteophye removal (Dr. Marlou Sa)  . Septoplasty  1983    at 54 years of age  . Bialteral inguinal hernia  1967    Allergies: Bee venom and Codeine  Medications: Prior to Admission medications   Medication Sig Start Date End Date Taking? Authorizing Provider  acetaminophen (TYLENOL) 500 MG tablet Take 1,000 mg by mouth every 6 (six) hours as needed (pain).    Yes Historical Provider, MD  allopurinol (ZYLOPRIM) 300 MG tablet TAKE 1 TABLET BY MOUTH DAILY 02/12/14  Yes Lucille Passy, MD  atorvastatin (LIPITOR) 20 MG tablet TAKE 1 TABLET BY MOUTH DAILY 02/12/14  Yes Lucille Passy, MD  fexofenadine (ALLEGRA) 180 MG tablet Take 180 mg by mouth daily.    Yes Historical Provider, MD  lisinopril (PRINIVIL,ZESTRIL) 10 MG tablet Take 10 mg by mouth at bedtime.   Yes Historical Provider, MD    Probiotic Product (PROBIOTIC PO) Take 1 tablet by mouth daily.   Yes Historical Provider, MD  QVAR 80 MCG/ACT inhaler INHALE 2 PUFFS INTO THE LUNGS EVERY MORNING AND 2 PUFFS 12 HOURS LATER Patient taking differently: INHALE 2 PUFFS INTO THE LUNGS EVERY MORNING 03/13/14  Yes Lucille Passy, MD  atorvastatin (LIPITOR) 20 MG tablet TAKE ONE (1) TABLET BY MOUTH EVERY DAY Patient not taking: Reported on 05/02/2014 07/05/13   Lucille Passy, MD  clotrimazole-betamethasone (LOTRISONE) cream Apply topically 2 (two) times daily. Patient not taking: Reported on 05/02/2014 02/03/12   Lucille Passy, MD  cyclobenzaprine (FLEXERIL) 10 MG tablet Take 1 tablet (10 mg total) by mouth at bedtime as needed. Patient not taking: Reported on 05/02/2014 10/27/11   Lucille Passy, MD  dextromethorphan (DELSYM) 30 MG/5ML liquid Take 60 mg by mouth daily as needed for cough.     Historical Provider, MD  famotidine (PEPCID) 20 MG tablet Take 20 mg by mouth at bedtime as needed for heartburn or indigestion (acid reflux).     Historical Provider, MD  predniSONE (DELTASONE) 20 MG tablet 3 a day for 3 days, then 2 a day for 3 days, then 1 a day for 3 days, then 0.5 a day for 4 days. With food. Patient not taking: Reported on 05/02/2014 10/31/13   Tonia Ghent, MD  Family History  Problem Relation Age of Onset  . Hypertension Father   . Diabetes Maternal Grandmother   . Depression Neg Hx   . Drug abuse Neg Hx   . Stroke Neg Hx   . Colon cancer Neg Hx   . Stomach cancer Neg Hx     History   Social History  . Marital Status: Married    Spouse Name: N/A  . Number of Children: N/A  . Years of Education: N/A   Social History Main Topics  . Smoking status: Never Smoker   . Smokeless tobacco: Former Systems developer    Quit date: 02/23/1993  . Alcohol Use: No  . Drug Use: No  . Sexual Activity: Not on file   Other Topics Concern  . None   Social History Narrative    Review of Systems: A 12 point ROS discussed and pertinent  positives are indicated in the HPI above.  All other systems are negative.  Review of Systems  Vital Signs: BP 159/89 mmHg  Pulse 79  Temp(Src) 97 F (36.1 C)  Resp 18  Ht 6' (1.829 m)  Wt 280 lb (127.007 kg)  BMI 37.97 kg/m2  SpO2 93%  Physical Exam  Constitutional: He is oriented to person, place, and time. No distress.  HENT:  Head: Normocephalic and atraumatic.  Neck: No tracheal deviation present.  Cardiovascular: Normal rate and regular rhythm.  Exam reveals no gallop and no friction rub.   No murmur heard. Pulmonary/Chest: Effort normal and breath sounds normal. No respiratory distress. He has no wheezes. He has no rales.  Abdominal: Soft. Bowel sounds are normal. He exhibits no distension. There is no tenderness.  Neurological: He is alert and oriented to person, place, and time.  Skin: He is not diaphoretic.  Psychiatric: He has a normal mood and affect. His behavior is normal. Thought content normal.    Mallampati Score:  MD Evaluation Airway: WNL Heart: WNL Abdomen: WNL Chest/ Lungs: WNL ASA  Classification: 2 Mallampati/Airway Score: Two  Labs:  CBC:  Recent Labs  05/03/14 0830  WBC 8.8  HGB 14.0  HCT 40.5  PLT 198    COAGS:  Recent Labs  05/03/14 0830  INR 0.93  APTT 28    BMP:  Recent Labs  07/03/13 1102  NA 140  K 4.7  CL 104  CO2 29  GLUCOSE 98  BUN 18  CALCIUM 9.8  CREATININE 1.3    LIVER FUNCTION TESTS:  Recent Labs  07/03/13 1102  BILITOT 0.9  AST 24  ALT 25  ALKPHOS 64  PROT 6.4  ALBUMIN 3.8   Assessment and Plan: Proteinuria Hematuria  Renal insufficiency Seen by Nephrology  Scheduled today for image guided renal biopsy with moderate sedation The patient has been NPO, no blood thinners taken, labs and vitals have been reviewed. Risks and Benefits discussed with the patient including, but not limited to bleeding, infection, damage to adjacent structures or low yield requiring additional tests. All of  the patient's questions were answered, patient is agreeable to proceed. Consent signed and in chart. OSA , does not use CPAP   Thank you for this interesting consult.  I greatly enjoyed meeting Richard Giles and look forward to participating in their care.  SignedHedy Jacob 05/03/2014, 9:33 AM   I spent a total of 20 Minutes in face to face in clinical consultation, greater than 50% of which was counseling/coordinating care for proteinuria, hematuria and renal insufficiency.

## 2014-05-03 NOTE — Sedation Documentation (Signed)
Bandaid R lower back area, clean, dry, intact

## 2014-05-03 NOTE — Procedures (Signed)
US guided right renal biopsy.  2 cores obtained from lower pole.  Minimal blood loss.  No immediate complication.  Bedrest for 4 hours.

## 2014-05-03 NOTE — Progress Notes (Signed)
Patient voided without difficulty of clear yellow urine prior to discharge.

## 2014-05-03 NOTE — Discharge Instructions (Signed)
Kidney Biopsy, Care After °Refer to this sheet in the next few weeks. These instructions provide you with information on caring for yourself after your procedure. Your health care provider may also give you more specific instructions. Your treatment has been planned according to current medical practices, but problems sometimes occur. Call your health care provider if you have any problems or questions after your procedure.  °WHAT TO EXPECT AFTER THE PROCEDURE  °· You may notice blood in the urine for the first 24 hours after the biopsy. °· You may feel some pain at the biopsy site for 1-2 weeks after the biopsy. °HOME CARE INSTRUCTIONS °· Do not lift anything heavier than 10 lb (4.5 kg) for 2 weeks. °· Do not take any non-steroidal anti-inflammatory drugs (NSAIDs) or any blood thinners for a week after the biopsy unless instructed to do so by your health care provider. °· Only take medicines for pain, fever, or discomfort as directed by your health care provider. °SEEK MEDICAL CARE IF: °· You have bloody urine more than 24 hours after the biopsy.   °· You develop a fever.   °· You cannot urinate.   °· You have increasing pain at the biopsy site.   °SEEK IMMEDIATE MEDICAL CARE IF: °You feel faint or dizzy.  °Document Released: 10/12/2012 Document Reviewed: 10/12/2012 °ExitCare® Patient Information ©2015 [employer]. This information is not intended to replace advice given to you by your health care provider. Make sure you discuss any questions you have with your health care provider. ° °

## 2014-05-15 ENCOUNTER — Encounter (HOSPITAL_COMMUNITY): Payer: Self-pay

## 2014-05-16 ENCOUNTER — Encounter (HOSPITAL_COMMUNITY): Payer: Self-pay

## 2014-06-06 ENCOUNTER — Other Ambulatory Visit: Payer: Self-pay | Admitting: Family Medicine

## 2014-06-25 ENCOUNTER — Ambulatory Visit (INDEPENDENT_AMBULATORY_CARE_PROVIDER_SITE_OTHER): Payer: 59 | Admitting: Family Medicine

## 2014-06-25 ENCOUNTER — Encounter: Payer: Self-pay | Admitting: Family Medicine

## 2014-06-25 ENCOUNTER — Ambulatory Visit (INDEPENDENT_AMBULATORY_CARE_PROVIDER_SITE_OTHER)
Admission: RE | Admit: 2014-06-25 | Discharge: 2014-06-25 | Disposition: A | Discharge status: Home / Self Care | Payer: 59 | Source: Ambulatory Visit | Attending: Family Medicine | Admitting: Family Medicine

## 2014-06-25 VITALS — BP 142/94 | HR 78 | Temp 98.3°F | Wt 290.2 lb

## 2014-06-25 DIAGNOSIS — J209 Acute bronchitis, unspecified: Secondary | ICD-10-CM

## 2014-06-25 DIAGNOSIS — J069 Acute upper respiratory infection, unspecified: Secondary | ICD-10-CM | POA: Diagnosis not present

## 2014-06-25 MED ORDER — AZITHROMYCIN 250 MG PO TABS: ORAL_TABLET | ORAL | Status: AC

## 2014-06-25 NOTE — Progress Notes (Signed)
Subjective:   Patient ID: Richard Giles, male    DOB: 1960-05-14, 54 y.o.   MRN: [id number]  Richard Giles is a pleasant 54 y.o. year old male who presents to clinic today with Cough and Nasal Congestion  on 06/25/2014  HPI:  Over one week of progressive URI symptoms- congestion, cough. One week ago, started taking what Dr. Melvyn Novas has suggested- prednisone, Nexium, Delsym. Takes Qvar twice daily.  Symptoms a little improved- actually slept last 3 nights but still having significant dry cough.  Current Outpatient Prescriptions on File Prior to Visit  Medication Sig Dispense Refill  . acetaminophen (TYLENOL) 500 MG tablet Take 1,000 mg by mouth every 6 (six) hours as needed (pain).     Marland Kitchen allopurinol (ZYLOPRIM) 300 MG tablet TAKE 1 TABLET BY MOUTH DAILY 30 tablet 3  . atorvastatin (LIPITOR) 20 MG tablet TAKE 1 TABLET BY MOUTH DAILY 30 tablet 0  . dextromethorphan (DELSYM) 30 MG/5ML liquid Take 60 mg by mouth daily as needed for cough.     . famotidine (PEPCID) 20 MG tablet Take 20 mg by mouth at bedtime as needed for heartburn or indigestion (acid reflux).     . fexofenadine (ALLEGRA) 180 MG tablet Take 180 mg by mouth daily.     Marland Kitchen lisinopril (PRINIVIL,ZESTRIL) 10 MG tablet Take 10 mg by mouth at bedtime.    . Probiotic Product (PROBIOTIC PO) Take 1 tablet by mouth daily.    Marland Kitchen QVAR 80 MCG/ACT inhaler INHALE 2 PUFFS INTO THE LUNGS EVERY MORNING AND 2 PUFFS 12 HOURS LATER (Patient taking differently: INHALE 2 PUFFS INTO THE LUNGS EVERY MORNING) 8.7 g 11   No current facility-administered medications on file prior to visit.    Allergies  Allergen Reactions  . Bee Venom Anaphylaxis  . Codeine Diarrhea and Nausea And Vomiting    Past Medical History  Diagnosis Date  . Allergy     allergy workup with Dr. Caprice Red : Maybe trees Did not receive shots per patient  . Chronic cough onset 2005/ resolved on ICS July 19,2011    Sinus CT negative 07/08/2009 negative  . Chronic bronchitis     . GERD (gastroesophageal reflux disease)   . Hyperlipidemia   . Restless leg syndrome   . Sleep apnea     does not have to use cpap  . Diabetes mellitus without complication     Past Surgical History  Procedure Laterality Date  . Foot surgery  09/03/2003    Left foot osteophye removal (Dr. Marlou Sa)  . Septoplasty  1983    at 54 years of age  . Bialteral inguinal hernia  1967    Family History  Problem Relation Age of Onset  . Hypertension Father   . Diabetes Maternal Grandmother   . Depression Neg Hx   . Drug abuse Neg Hx   . Stroke Neg Hx   . Colon cancer Neg Hx   . Stomach cancer Neg Hx     History   Social History  . Marital Status: Married    Spouse Name: N/A  . Number of Children: N/A  . Years of Education: N/A   Occupational History  . Not on file.   Social History Main Topics  . Smoking status: Never Smoker   . Smokeless tobacco: Former Systems developer    Quit date: 02/23/1993  . Alcohol Use: No  . Drug Use: No  . Sexual Activity: Not on file   Other Topics Concern  . Not  on file   Social History Narrative   The PMH, PSH, Social History, Family History, Medications, and allergies have been reviewed in [facility], and have been updated if relevant.   Review of Systems  Constitutional: Negative.   HENT: Negative.   Eyes: Negative.   Respiratory: Positive for cough. Negative for shortness of breath, wheezing and stridor.   Cardiovascular: Negative.   Gastrointestinal: Negative.   Endocrine: Negative.   Genitourinary: Negative.   Musculoskeletal: Negative.   Skin: Negative.   Allergic/Immunologic: Negative.   Neurological: Negative.   Hematological: Negative.   Psychiatric/Behavioral: Negative.   All other systems reviewed and are negative.      Objective:    BP 142/94 mmHg  Pulse 78  Temp(Src) 98.3 F (36.8 C) (Oral)  Wt 290 lb 4 oz (131.657 kg)  SpO2 98%   Physical Exam  Constitutional: He is oriented to person, place, and time. He appears  well-developed and well-nourished. No distress.  HENT:  Head: Normocephalic.  Eyes: Conjunctivae are normal.  Neck: Normal range of motion.  Pulmonary/Chest: Effort normal. No respiratory distress. He has wheezes. He has no rales. He exhibits no tenderness.  Decreased BS bilaterally  Abdominal: Soft.  Neurological: He is alert and oriented to person, place, and time. No cranial nerve deficit.  Skin: Skin is warm and dry. No erythema.  Psychiatric: He has a normal mood and affect. His behavior is normal. Judgment and thought content normal.  Nursing note and vitals reviewed.         Assessment & Plan:   Acute bronchitis, unspecified organism - Plan: DG Chest 2 View  Acute upper respiratory infection No Follow-up on file.

## 2014-06-25 NOTE — Progress Notes (Signed)
Pre visit review using our clinic review tool, if applicable. No additional management support is needed unless otherwise documented below in the visit note. 

## 2014-06-25 NOTE — Assessment & Plan Note (Signed)
New- treat with zpack given duration of symptoms, physical exam findings and h/o COPD. Also get a CXR today to rule infiltrate or effusion as he does have decreased breath sounds bilaterally. Call or return to clinic prn if these symptoms worsen or fail to improve as anticipated. The patient indicates understanding of these issues and agrees with the plan.

## 2014-06-25 NOTE — Patient Instructions (Signed)
Good to see you. Keep taking what you are taking. We are adding a zpack. We will call with your xray results

## 2014-07-02 ENCOUNTER — Encounter: Payer: Self-pay | Admitting: Internal Medicine

## 2014-07-06 ENCOUNTER — Encounter: Payer: Self-pay | Admitting: Family Medicine

## 2014-07-06 ENCOUNTER — Other Ambulatory Visit: Payer: Self-pay | Admitting: Family Medicine

## 2014-07-06 DIAGNOSIS — Z0000 Encounter for general adult medical examination without abnormal findings: Secondary | ICD-10-CM

## 2014-07-06 DIAGNOSIS — E119 Type 2 diabetes mellitus without complications: Secondary | ICD-10-CM

## 2014-07-06 DIAGNOSIS — E785 Hyperlipidemia, unspecified: Secondary | ICD-10-CM

## 2014-07-09 ENCOUNTER — Other Ambulatory Visit: Payer: Self-pay | Admitting: Family Medicine

## 2014-07-09 MED ORDER — AMOXICILLIN-POT CLAVULANATE 875-125 MG PO TABS: 1.0000 | ORAL_TABLET | Freq: Two times a day (BID) | ORAL | Status: DC

## 2014-07-11 ENCOUNTER — Telehealth: Payer: Self-pay | Admitting: Internal Medicine

## 2014-07-11 ENCOUNTER — Ambulatory Visit (INDEPENDENT_AMBULATORY_CARE_PROVIDER_SITE_OTHER): Payer: 59 | Admitting: Internal Medicine

## 2014-07-11 ENCOUNTER — Encounter: Payer: Self-pay | Admitting: Internal Medicine

## 2014-07-11 ENCOUNTER — Other Ambulatory Visit (INDEPENDENT_AMBULATORY_CARE_PROVIDER_SITE_OTHER): Payer: 59

## 2014-07-11 VITALS — BP 164/98 | HR 96 | Ht 71.0 in | Wt 288.6 lb

## 2014-07-11 DIAGNOSIS — I10 Essential (primary) hypertension: Secondary | ICD-10-CM

## 2014-07-11 DIAGNOSIS — E1159 Type 2 diabetes mellitus with other circulatory complications: Secondary | ICD-10-CM | POA: Insufficient documentation

## 2014-07-11 DIAGNOSIS — E785 Hyperlipidemia, unspecified: Secondary | ICD-10-CM | POA: Diagnosis not present

## 2014-07-11 DIAGNOSIS — J45991 Cough variant asthma: Secondary | ICD-10-CM | POA: Diagnosis not present

## 2014-07-11 DIAGNOSIS — E119 Type 2 diabetes mellitus without complications: Secondary | ICD-10-CM | POA: Diagnosis not present

## 2014-07-11 DIAGNOSIS — Z0000 Encounter for general adult medical examination without abnormal findings: Secondary | ICD-10-CM

## 2014-07-11 DIAGNOSIS — I152 Hypertension secondary to endocrine disorders: Secondary | ICD-10-CM | POA: Insufficient documentation

## 2014-07-11 LAB — LIPID PANEL
Cholesterol: 178 mg/dL (ref 0–200)
HDL: 41 mg/dL (ref >39.00)
NonHDL: 137
Total CHOL/HDL Ratio: 4
Triglycerides: 224 mg/dL — ABNORMAL HIGH (ref 0.0–149.0)
VLDL: 44.8 mg/dL — ABNORMAL HIGH (ref 0.0–40.0)

## 2014-07-11 LAB — CBC WITH DIFFERENTIAL/PLATELET
Basophils Absolute: 0 10*3/uL (ref 0.0–0.1)
Basophils Relative: 0.4 % (ref 0.0–3.0)
Eosinophils Absolute: 0.2 10*3/uL (ref 0.0–0.7)
Eosinophils Relative: 2.3 % (ref 0.0–5.0)
HCT: 40.2 % (ref 39.0–52.0)
Hemoglobin: 13.8 g/dL (ref 13.0–17.0)
Lymphocytes Relative: 18.2 % (ref 12.0–46.0)
Lymphs Abs: 2 10*3/uL (ref 0.7–4.0)
MCHC: 34.3 g/dL (ref 30.0–36.0)
MCV: 92.5 fl (ref 78.0–100.0)
Monocytes Absolute: 0.6 10*3/uL (ref 0.1–1.0)
Monocytes Relative: 6 % (ref 3.0–12.0)
Neutro Abs: 7.9 10*3/uL — ABNORMAL HIGH (ref 1.4–7.7)
Neutrophils Relative %: 73.1 % (ref 43.0–77.0)
Platelets: 241 10*3/uL (ref 150.0–400.0)
RBC: 4.34 Mil/uL (ref 4.22–5.81)
RDW: 13.9 % (ref 11.5–15.5)
WBC: 10.8 10*3/uL — ABNORMAL HIGH (ref 4.0–10.5)

## 2014-07-11 LAB — MICROALBUMIN / CREATININE URINE RATIO
Creatinine,U: 136.7 mg/dL
Microalb Creat Ratio: 114.9 mg/g — ABNORMAL HIGH (ref 0.0–30.0)
Microalb, Ur: 157 mg/dL — ABNORMAL HIGH (ref 0.0–1.9)

## 2014-07-11 LAB — COMPREHENSIVE METABOLIC PANEL
ALT: 23 U/L (ref 0–53)
AST: 21 U/L (ref 0–37)
Albumin: 3.9 g/dL (ref 3.5–5.2)
Alkaline Phosphatase: 93 U/L (ref 39–117)
BUN: 21 mg/dL (ref 6–23)
CO2: 31 mEq/L (ref 19–32)
Calcium: 9.4 mg/dL (ref 8.4–10.5)
Chloride: 101 mEq/L (ref 96–112)
Creatinine, Ser: 1.65 mg/dL — ABNORMAL HIGH (ref 0.40–1.50)
GFR: 46.52 mL/min — ABNORMAL LOW (ref >60.00)
Glucose, Bld: 142 mg/dL — ABNORMAL HIGH (ref 70–99)
Potassium: 4.9 mEq/L (ref 3.5–5.1)
Sodium: 137 mEq/L (ref 135–145)
Total Bilirubin: 0.6 mg/dL (ref 0.2–1.2)
Total Protein: 6.5 g/dL (ref 6.0–8.3)

## 2014-07-11 LAB — LDL CHOLESTEROL, DIRECT: Direct LDL: 92 mg/dL

## 2014-07-11 LAB — HEMOGLOBIN A1C: Hgb A1c MFr Bld: 6.1 % (ref 4.6–6.5)

## 2014-07-11 LAB — PSA: PSA: 0.7 ng/mL (ref 0.10–4.00)

## 2014-07-11 MED ORDER — MOMETASONE FURO-FORMOTEROL FUM 100-5 MCG/ACT IN AERO: INHALATION_SPRAY | RESPIRATORY_TRACT | Status: DC

## 2014-07-11 MED ORDER — PREDNISONE 10 MG PO TABS: ORAL_TABLET | ORAL | Status: DC

## 2014-07-11 MED ORDER — VALSARTAN 160 MG PO TABS: 160.0000 mg | ORAL_TABLET | Freq: Every day | ORAL | Status: DC

## 2014-07-11 MED ORDER — PANTOPRAZOLE SODIUM 40 MG PO TBEC: 40.0000 mg | DELAYED_RELEASE_TABLET | Freq: Every day | ORAL | Status: DC

## 2014-07-11 MED ORDER — FLUTTER DEVI: Status: DC

## 2014-07-11 MED ORDER — IRBESARTAN 150 MG PO TABS: 150.0000 mg | ORAL_TABLET | Freq: Every day | ORAL | Status: DC

## 2014-07-11 MED ORDER — ESOMEPRAZOLE MAGNESIUM 40 MG PO CPDR: 40.0000 mg | DELAYED_RELEASE_CAPSULE | Freq: Every day | ORAL | Status: DC

## 2014-07-11 NOTE — Assessment & Plan Note (Signed)

## 2014-07-11 NOTE — Patient Instructions (Addendum)
Stop lisinopril  avapro (ibesartan 150 mg) one daily in place of lisinopril 40   Stop qvar and start dulera 100 Take 2 puffs first thing in am and then another 2 puffs about 12 hours later.   Take delsym two tsp every 12 hours and supplement if needed with  tramadol 50 mg up to 2 every 4 hours to suppress the urge to cough. Swallowing water or using ice chips/non mint and menthol containing candies (such as lifesavers or sugarless jolly ranchers) are also effective.  You should rest your voice and avoid activities that you know make you cough.  Once you have eliminated the cough for 3 straight days try reducing the tramadol first,  then the delsym as tolerated.    Finish augmentin   Prednisone 10 mg take  4 each am x 2 days,   2 each am x 2 days,  1 each am x 2 days and stop   Increase nexium to 40 mg Take 30-60 min before first meal of the day   Please schedule a follow up office visit in 2  weeks, sooner if needed

## 2014-07-11 NOTE — Telephone Encounter (Signed)
protonix sent to pharmacy.  Nothing further needed.

## 2014-07-11 NOTE — Progress Notes (Signed)
Subjective:     Patient ID: Richard Giles, male   DOB: 1960/07/23   MRN: [id number]  Brief patient profile:  53 yowm never smoker no previous hx of allergies or asthma new onset recurrent bronchitis 2005 never completely better since then until placed on qvar in pulmonary clinic 06/2009 c/w cough variant asthma   History of Present Illness   Jun 24, 2009 cc daily cough x 2005 maybe worse in spring and fall seems to peak after stirs around in am can become severe and lightheaded, also occ vomits from coughing. ventolin may helps some but does not eliminate it. prednisone helps some, just finished prednisone last month and much worse since stopped it. minimal actual sputum production. assoc with mild chronic nasal congestion. rec Prednisone x 12 days  Nexium 40 mg Take one 30-60 min before first meal of the day  Pepcid 20 mg at bedtime  Delsym 2 tsp every 12 hours as needed for cough  Stop symbicort   Jul 08, 2009 2 wk followup. Pt states that cough had improved while on prednisone. When he finished prednisone a few days ago cough started to come back. Cough was prod this am with white/yellow sputum. This has occurred despite compliance with diet and ppi. rec add qvar 80 2 bid   July 30, 2009 3 wk followup. Pt states that his cough has resolved.> rec  no change rx  12/12/10 ov/ Richard Giles cc cough resolved completely on qvar and wants it refilled,   No doe >refill qvar , singulair d/c   07/02/2011 Acute OV  Complains of 3 weeks of initial URI that has progressed with increased prod cough with white/yellow mucus, increased SOB, wheezing, tightness in chest. Worse for last 5 days Was called in steroid pack that he finished this am. Prednisone helped only minimally.  Now coughing up thick yellow mucus.  No hemotpysis or chest pain . No edema.  Was under good control with cough until last 3 days.  Using delsym for cough.  Restarted singulair this week.  Restarted Nexium/Pepcid 2 weeks ago.  Has been  using mint cough drops- " forgot about avoiding mints"  rec Zpack take as directed.  Delsym 2 teaspoons twice daily as needed for cough. Avoid Mint products.  Hydromet 1-2 teaspoons every 4-6 hours as needed for cough, may make you sleepy. Use sugarless candy, water, ice chips to help avoid coughing   09/03/2011 f/u ov/Richard Giles cc Patient states better since last visit. States cough is better.  rec Plan A = Qvar 80 Take 2 puffs first thing in am and then another 2 puffs about 12 hours later and blow it out through the nose. Plan B Only use your albuterol as a rescue medication to be used if you can't catch your breath by resting or doing a relaxed purse lip breathing pattern. The less you use it, the better it will work when you need it.  Only use your allegra as a "rescue medication" for your nasal symptoms Try Nexium 40 (prilosec 20mg  )   Take 30-60 min before first meal of the day and Pepcid 20 mg one bedtime until cough is completely gone for at least a week without the need for cough suppression   09/02/2012 f/u ov/Richard Giles re cough variant asthma/ good control on qvar/ here for yearly f/u/ refills Chief Complaint  Patient presents with  . Follow-up    Pt states doing well and denies any co's today.   Refill qvar 80 / f/u  prn   07/11/2014 f/u ov/Richard Giles re: recurrent cough  Chief Complaint  Patient presents with  . Acute Visit    Pt c/o "cold" for the past 5 wks- now having increased SOB, acid reflux and cough. Cough is prod with white/blood tinged sputum.    qvar 80 2 puffs each am/  nexium /pepcid whenever cough flared which was very rare until around mid April 2016 while on ACEi felt like caught wife's cold with low grade fever and barking quality cough and rx w/in a week started nex/pepcid and took round of pred plus delsym seen by Dr Deborra Medina 06/25/14 rx zpak then May 16 augmentin > to [facility] 07/10/14 p coughing fit > rib injury on L but neg cxr rx tramadol but not taking aggressively.   No  obvious day to day or daytime variabilty or assoc   chest tightness, subjective wheeze overt sinus or hb symptoms. No unusual exp hx or h/o childhood pna/ asthma or knowledge of premature birth.   Also denies any obvious fluctuation of symptoms with weather or environmental changes or other aggravating or alleviating factors except as outlined above   Current Medications, Allergies, Complete Past Medical History, Past Surgical History, Family History, and Social History were reviewed in [employer] record.  ROS  The following are not active complaints unless bolded sore throat, dysphagia, dental problems, itching, sneezing,  nasal congestion or excess/ purulent secretions, ear ache,   fever, chills, sweats, unintended wt loss, pleuritic cp or exertional cp, hemoptysis,  orthopnea pnd or leg swelling, presyncope, palpitations, heartburn, abdominal pain, anorexia, nausea, vomiting, diarrhea  or change in bowel or urinary habits, change in stools or urine, dysuria,hematuria,  rash, arthralgias, visual complaints, headache, numbness weakness or ataxia or problems with walking or coordination,  change in mood/affect or memory.                  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  - Maybe trees > did not rec shots per pt  Chronic cough  - onset 2005 > resolved on ICS September 10, 2009  - Allergy profile sent Jul 08, 2009 >> neg  - Sinus Ct ordered Jul 08, 2009 >> neg  - Qvar trial Jul 08, 2009 >> much better July 30, 2009 with 90% effective hfa            Objective:   Physical Exam   Obese amb wm with extremely harsh upper airway cough clutching L lower ant chest wall    07/11/2014       289   Wt Readings from Last 3 Encounters:  09/02/12 298 lb (135.172 kg)  03/14/12 284 lb (128.822 kg)  02/03/12 281 lb (127.461 kg)     HEENT: nl dentition, turbinates, and orophanx. Nl external ear canals without cough reflex   NECK :  without  JVD/Nodes/TM/ nl carotid upstrokes bilaterally   LUNGS: no acc muscle use, clear to A and P bilaterally without cough on insp or exp maneuvers   CV:  RRR  no s3 or murmur or increase in P2, no edema   ABD:  soft and nontender with nl excursion in the supine position. No bruits or organomegaly, bowel sounds nl  MS:  warm without deformities, calf tenderness, cyanosis or clubbing      I personally reviewed images and agree with radiology impression as follows:  CXR:  06/25/14  Low volume exam. No superimposed acute process or interval change   CXR  07/10/14 Normal bones and soft tissues. Moderate degenerative changes thoracic spine. Normal heart size. Normal pulmonary vasculature. Clear lungs. No pleural effusion     Assessment:

## 2014-07-11 NOTE — Assessment & Plan Note (Addendum)
The proper method of use, as well as anticipated side effects, of a metered-dose inhaler are discussed and demonstrated to the patient. Improved effectiveness after extensive coaching during this visit to a level of approximately  90%   I had an extended discussion with the patient reviewing all relevant studies completed to date and  lasting 30 minutes of a 40 minute visit on the following ongoing concerns:   1) cough is way out of proportion to AB c/w ACEi effects > d/c is the only way to know > see hbp  2) qvar not effective for flare > short term rx with dulera 100 2bid  3) Explained the natural history of uri and why it's necessary in patients at risk to treat GERD aggressively - at least  short term -   to reduce risk of evolving cyclical cough initially  triggered by epithelial injury and a heightened sensitivty to the effects of any upper airway irritants,  most importantly acid - related - then perpetuated by epithelial injury related to the cough itself as the upper airway collapses on itself.  That is, the more sensitive the epithelium becomes once it is damaged by the virus, the more the ensuing irritability> the more the cough, the more the secondary reflux (especially in those prone to reflux) the more the irritation of the sensitive mucosa and so on in a  Classic cyclical pattern.    4) control cyclical cough with flutter/ tramadol  5) Each maintenance medication was reviewed in detail including most importantly the difference between maintenance and as needed and under what circumstances the prns are to be used.  Please see instructions for details which were reviewed in writing and the patient given a copy.

## 2014-07-11 NOTE — Telephone Encounter (Signed)
nexium not covered. Please advise MW thanks

## 2014-07-11 NOTE — Telephone Encounter (Signed)
Pantoprazole (protonix) 40 mg   Take 30-60 min before first meal of the day instead of nexium, same # and refills

## 2014-07-16 ENCOUNTER — Encounter: Payer: Self-pay | Admitting: Family Medicine

## 2014-07-16 ENCOUNTER — Ambulatory Visit (INDEPENDENT_AMBULATORY_CARE_PROVIDER_SITE_OTHER): Payer: 59 | Admitting: Family Medicine

## 2014-07-16 VITALS — BP 128/78 | HR 93 | Temp 97.9°F | Ht 71.0 in | Wt 284.0 lb

## 2014-07-16 DIAGNOSIS — N289 Disorder of kidney and ureter, unspecified: Secondary | ICD-10-CM | POA: Diagnosis not present

## 2014-07-16 DIAGNOSIS — E119 Type 2 diabetes mellitus without complications: Secondary | ICD-10-CM

## 2014-07-16 DIAGNOSIS — I10 Essential (primary) hypertension: Secondary | ICD-10-CM

## 2014-07-16 DIAGNOSIS — N028 Recurrent and persistent hematuria with other morphologic changes: Secondary | ICD-10-CM | POA: Diagnosis not present

## 2014-07-16 DIAGNOSIS — Z0000 Encounter for general adult medical examination without abnormal findings: Secondary | ICD-10-CM

## 2014-07-16 DIAGNOSIS — J45991 Cough variant asthma: Secondary | ICD-10-CM

## 2014-07-16 DIAGNOSIS — E785 Hyperlipidemia, unspecified: Secondary | ICD-10-CM

## 2014-07-16 NOTE — Assessment & Plan Note (Addendum)
Diet controlled.  Encouraged him to start walking again. On [facility] exam UTD. Foot exam today.

## 2014-07-16 NOTE — Assessment & Plan Note (Signed)
Well controlled on current dose of Lipitor. No changes made.

## 2014-07-16 NOTE — Patient Instructions (Addendum)
Great to see you. Please consider getting your pneumonia vaccine- pneumovax.  Please call Dr. Vena Rua office to schedule your colonoscopy (due next month).

## 2014-07-16 NOTE — Progress Notes (Signed)
Pre visit review using our clinic review tool, if applicable. No additional management support is needed unless otherwise documented below in the visit note. 

## 2014-07-16 NOTE — Assessment & Plan Note (Signed)
Reviewed preventive care protocols, scheduled due services, and updated immunizations Discussed nutrition, exercise, diet, and healthy lifestyle.  Discussed pneumovax- he wants to talk with Dr. Melvyn Novas about this first.  Due for recall colonoscopy - he will call to set this up.

## 2014-07-16 NOTE — Progress Notes (Signed)
Subjective:    Patient ID: Richard Giles, male    DOB: 27-Apr-1960, 54 y.o.   MRN: [id number]  HPI  Pleasant 54 yo male here for CPX. Colonoscopy 08/20/11- [facility]- 3 year recall. Tdap 5/2/1   DM- has been diet controlled since he lost weight although has gained some back. Breathing has been worse so he is not walking as much.  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  Followed by [facility].  Was started on an ACEI- worsened his cough, changed to Avapro.    Lab Results  Component Value Date   CREATININE 1.65* 07/11/2014   Cough- saw Dr. Melvyn Novas on 07/11/14- note reviewed.  Started on [place] and PPI changed to protonix.  He does feel it is a little better but still having a chronic cough with DOE.  Per pt- neg EKG in ER two weeks ago- was seen for CP, felt to be rib fracture from coughing spell.  HLD- on lipitor 20 mg daily. Lab Results  Component Value Date   CHOL 178 07/11/2014   HDL 41.00 07/11/2014   LDLCALC 178* 07/03/2013   LDLDIRECT 92.0 07/11/2014   TRIG 224.0* 07/11/2014   CHOLHDL 4 07/11/2014   Lab Results  Component Value Date   PSA 0.70 07/11/2014   PSA 0.71 07/03/2013   PSA 0.57 09/07/2012   Lab Results  Component Value Date   WBC 10.8* 07/11/2014   HGB 13.8 07/11/2014   HCT 40.2 07/11/2014   MCV 92.5 07/11/2014   PLT 241.0 07/11/2014   Lab Results  Component Value Date   ALT 23 07/11/2014   AST 21 07/11/2014   ALKPHOS 93 07/11/2014   BILITOT 0.6 07/11/2014   Current Outpatient Prescriptions on File Prior to Visit  Medication Sig Dispense Refill  . acetaminophen (TYLENOL) 500 MG tablet Take 1,000 mg by mouth every 6 (six) hours as needed (pain).     Marland Kitchen allopurinol (ZYLOPRIM) 300 MG tablet TAKE 1 TABLET BY MOUTH DAILY  30 tablet 5  . atorvastatin (LIPITOR) 20 MG tablet TAKE 1 TABLET BY MOUTH DAILY 30 tablet 5  . dextromethorphan (DELSYM) 30 MG/5ML liquid Take 60 mg by mouth daily as needed for cough.     . famotidine (PEPCID) 20 MG tablet Take 20 mg by mouth at bedtime as needed for heartburn or indigestion (acid reflux).     . fexofenadine (ALLEGRA) 180 MG tablet Take 180 mg by mouth daily.     . irbesartan (AVAPRO) 150 MG tablet Take 1 tablet (150 mg total) by mouth daily. 30 tablet 2  . mometasone-formoterol (DULERA) 100-5 MCG/ACT AERO Take 2 puffs first thing in am and then another 2 puffs about 12 hours later.  0  . pantoprazole (PROTONIX) 40 MG tablet Take 1 tablet (40 mg total) by mouth daily. 30 tablet 2  . Probiotic Product (PROBIOTIC PO) Take 1 tablet by mouth daily.    Marland Kitchen Respiratory Therapy Supplies (FLUTTER) DEVI Use as directed 1 each 0   No current facility-administered medications on file prior to visit.    Allergies  Allergen Reactions  . Bee Venom Anaphylaxis  . Codeine Diarrhea and Nausea And Vomiting    Past Medical History  Diagnosis Date  . Allergy  allergy workup with Dr. Caprice Red : Maybe trees Did not receive shots per patient  . Chronic cough onset 2005/ resolved on ICS July 19,2011    Sinus CT negative 07/08/2009 negative  . Chronic bronchitis   . GERD (gastroesophageal reflux disease)   . Hyperlipidemia   . Restless leg syndrome   . Sleep apnea     does not have to use cpap  . Diabetes mellitus without complication     Past Surgical History  Procedure Laterality Date  . Foot surgery  09/03/2003    Left foot osteophye removal (Dr. Marlou Sa)  . Septoplasty  1983    at 54 years of age  . Bialteral inguinal hernia  1967    Family History  Problem Relation Age of Onset  . Hypertension Father   . Diabetes Maternal Grandmother   . Depression Neg Hx   . Drug abuse Neg Hx   . Stroke Neg Hx   . Colon cancer Neg Hx   . Stomach cancer Neg Hx     History    Social History  . Marital Status: Married    Spouse Name: N/A  . Number of Children: N/A  . Years of Education: N/A   Occupational History  . Not on file.   Social History Main Topics  . Smoking status: Never Smoker   . Smokeless tobacco: Former Systems developer    Quit date: 02/23/1993  . Alcohol Use: No  . Drug Use: No  . Sexual Activity: Not on file   Other Topics Concern  . Not on file   Social History Narrative   The PMH, PSH, Social History, Family History, Medications, and allergies have been reviewed in [facility], and have been updated if relevant.   Review of Systems  Constitutional: Positive for fatigue.  HENT: Negative.   Eyes: Negative.   Respiratory: Positive for cough.   Cardiovascular: Negative.   Gastrointestinal: Negative.   Endocrine: Negative.   Genitourinary: Negative.   Musculoskeletal: Negative.   Skin: Negative.   Allergic/Immunologic: Positive for environmental allergies.  Neurological: Negative.   Hematological: Negative.   Psychiatric/Behavioral: Negative.   All other systems reviewed and are negative.       Past Medical History  Diagnosis Date  . Allergy     allergy workup with Dr. Caprice Red : Maybe trees Did not receive shots per patient  . Chronic cough onset 2005/ resolved on ICS July 19,2011    Sinus CT negative 07/08/2009 negative  . Chronic bronchitis   . GERD (gastroesophageal reflux disease)   . Hyperlipidemia   . Restless leg syndrome   . Sleep apnea     does not have to use cpap  . Diabetes mellitus without complication     Current Outpatient Prescriptions  Medication Sig Dispense Refill  . acetaminophen (TYLENOL) 500 MG tablet Take 1,000 mg by mouth every 6 (six) hours as needed (pain).     Marland Kitchen allopurinol (ZYLOPRIM) 300 MG tablet TAKE 1 TABLET BY MOUTH DAILY 30 tablet 5  . atorvastatin (LIPITOR) 20 MG tablet TAKE 1 TABLET BY MOUTH DAILY 30 tablet 5  . dextromethorphan (DELSYM) 30 MG/5ML liquid Take 60 mg by mouth daily as  needed for cough.     . famotidine (PEPCID) 20 MG tablet Take 20 mg by mouth at bedtime as needed for heartburn or indigestion (acid reflux).     . fexofenadine (ALLEGRA) 180 MG tablet Take 180 mg by mouth daily.     . irbesartan (AVAPRO) 150  MG tablet Take 1 tablet (150 mg total) by mouth daily. 30 tablet 2  . mometasone-formoterol (DULERA) 100-5 MCG/ACT AERO Take 2 puffs first thing in am and then another 2 puffs about 12 hours later.  0  . pantoprazole (PROTONIX) 40 MG tablet Take 1 tablet (40 mg total) by mouth daily. 30 tablet 2  . Probiotic Product (PROBIOTIC PO) Take 1 tablet by mouth daily.    Marland Kitchen Respiratory Therapy Supplies (FLUTTER) DEVI Use as directed 1 each 0   No current facility-administered medications for this visit.    Allergies  Allergen Reactions  . Bee Venom Anaphylaxis  . Codeine Diarrhea and Nausea And Vomiting    Family History  Problem Relation Age of Onset  . Hypertension Father   . Diabetes Maternal Grandmother   . Depression Neg Hx   . Drug abuse Neg Hx   . Stroke Neg Hx   . Colon cancer Neg Hx   . Stomach cancer Neg Hx     History   Social History  . Marital Status: Married    Spouse Name: N/A  . Number of Children: N/A  . Years of Education: N/A   Occupational History  . Not on file.   Social History Main Topics  . Smoking status: Never Smoker   . Smokeless tobacco: Former Systems developer    Quit date: 02/23/1993  . Alcohol Use: No  . Drug Use: No  . Sexual Activity: Not on file   Other Topics Concern  . Not on file   Social History Narrative       Objective:   Physical Exam   BP 128/78 mmHg  Pulse 93  Temp(Src) 97.9 F (36.6 C) (Oral)  Ht 5\' 11"  (1.803 m)  Wt 284 lb (128.822 kg)  BMI 39.63 kg/m2  SpO2 97% Wt Readings from Last 3 Encounters:  07/16/14 284 lb (128.822 kg)  07/11/14 288 lb 9.6 oz (130.908 kg)  06/25/14 290 lb 4 oz (131.657 kg)   General:  overweght male in NAD Eyes:  PERRL Ears:  External ear exam shows no  significant lesions or deformities.  Otoscopic examination reveals clear canals, tympanic membranes are intact bilaterally without bulging, retraction, inflammation or discharge. Hearing is grossly normal bilaterally. Nose:  External nasal examination shows no deformity or inflammation. Nasal mucosa are pink and moist without lesions or exudates. Mouth:  Oral mucosa and oropharynx without lesions or exudates.  Teeth in good repair. Neck:  no carotid bruit or thyromegaly no cervical or supraclavicular lymphadenopathy  Lungs:  Normal respiratory effort, chest expands symmetrically. Lungs are clear to auscultation, no crackles or wheezes. Heart:  Normal rate and regular rhythm. S1 and S2 normal without gallop, murmur, click, rub or other extra sounds. Abdomen:  Bowel sounds positive,abdomen soft and non-tender without masses, organomegaly or hernias noted. Pulses:  R and L posterior tibial pulses are full and equal bilaterally  Extremities:  no edema      Assessment & Plan:

## 2014-07-25 ENCOUNTER — Ambulatory Visit (INDEPENDENT_AMBULATORY_CARE_PROVIDER_SITE_OTHER): Payer: 59 | Admitting: Internal Medicine

## 2014-07-25 ENCOUNTER — Encounter: Payer: Self-pay | Admitting: Internal Medicine

## 2014-07-25 VITALS — BP 134/88 | HR 94 | Ht 71.0 in | Wt 288.0 lb

## 2014-07-25 DIAGNOSIS — I10 Essential (primary) hypertension: Secondary | ICD-10-CM | POA: Diagnosis not present

## 2014-07-25 DIAGNOSIS — J45991 Cough variant asthma: Secondary | ICD-10-CM | POA: Diagnosis not present

## 2014-07-25 NOTE — Patient Instructions (Addendum)
Whenever you are coughing for any reason or needing delsym stay on acid suppression for at least 2 weeks symptom free then ok to taper off   I will let Dr Joelyn Oms know why we changed your blood pressure pill   Ok to resume previous dose of qvar and stop dulera   If you are satisfied with your treatment plan,  let your doctor know and he/she can either refill your medications or you can return here when your prescription runs out.     If in any way you are not 100% satisfied,  please tell us.  If 100% better, tell your friends!  Pulmonary follow up is as needed

## 2014-07-25 NOTE — Progress Notes (Signed)
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 ventolin may helps some but does not eliminate it. prednisone helps some, just finished prednisone last month and much worse since stopped it. minimal actual sputum production. assoc with mild chronic nasal congestion. rec Prednisone x 12 days  Nexium 40 mg Take one 30-60 min before first meal of the day  Pepcid 20 mg at bedtime  Delsym 2 tsp every 12 hours as needed for cough  Stop symbicort   Jul 08, 2009 2 wk followup. Pt states that cough had improved while on prednisone. When he finished prednisone a few days ago cough started to come back. Cough was prod this am with white/yellow sputum. This has occurred despite compliance with diet and ppi. rec add qvar 80 2 bid   July 30, 2009 3 wk followup. Pt states that his cough has resolved.> rec  no change rx  12/12/10 ov/ Richard Giles cc cough resolved completely on qvar and wants it refilled,   No doe >refill qvar , singulair d/c   07/02/2011 Acute OV  Complains of 3 weeks of initial URI that has progressed with increased prod cough with white/yellow mucus, increased SOB, wheezing, tightness in chest. Worse for last 5 days Was called in steroid pack that he finished this am. Prednisone helped only minimally.  Now coughing up thick yellow mucus.  No hemotpysis or chest pain . No edema.  Was under good control with cough until last 3 days.  Using delsym for cough.  Restarted singulair this week.  Restarted Nexium/Pepcid 2 weeks ago.  Has been  using mint cough drops- " forgot about avoiding mints"  rec Zpack take as directed.  Delsym 2 teaspoons twice daily as needed for cough. Avoid Mint products.  Hydromet 1-2 teaspoons every 4-6 hours as needed for cough, may make you sleepy. Use sugarless candy, water, ice chips to help avoid coughing   09/03/2011 f/u ov/Richard Giles cc Patient states better since last visit. States cough is better.  rec Plan A = Qvar 80 Take 2 puffs first thing in am and then another 2 puffs about 12 hours later and blow it out through the nose. Plan B Only use your albuterol as a rescue medication to be used if you can't catch your breath by resting or doing a relaxed purse lip breathing pattern. The less you use it, the better it will work when you need it.  Only use your allegra as a "rescue medication" for your nasal symptoms Try Nexium 40 (prilosec 20mg  )   Take 30-60 min before first meal of the day and Pepcid 20 mg one bedtime until cough is completely gone for at least a week without the need for cough suppression   09/02/2012 f/u ov/Richard Giles re cough variant asthma/ good control on qvar/ here for yearly f/u/ refills Chief Complaint  Patient presents with  . Follow-up    Pt states doing well and denies any co's today.   Refill qvar 80 / f/u  prn   07/11/2014 f/u ov/Richard Giles re: recurrent cough  Chief Complaint  Patient presents with  . Acute Visit    Pt c/o "cold" for the past 5 wks- now having increased SOB, acid reflux and cough. Cough is prod with white/blood tinged sputum.    qvar 80 2 puffs each am/  nexium /pepcid whenever cough flared which was very rare until around mid April 2016 while on ACEi felt like caught wife's cold with low grade fever and barking quality cough and rx w/in a week started nex/pepcid and took round of pred plus delsym seen by Dr Deborra Medina 06/25/14 rx zpak then May 16 augmentin > to [facility] 07/10/14 p coughing fit > rib injury on L but neg cxr rx tramadol but not taking aggressively. rec Stop  lisinopril avapro (ibesartan 150 mg) one daily in place of lisinopril 40  stop qvar and start dulera 100 Take 2 puffs first thing in am and then another 2 puffs about 12 hours later.  Take delsym two tsp every 12 hours and supplement if needed with  tramadol 50 mg up to 2 every 4 hours   Once you have eliminated the cough for 3 straight days try reducing the tramadol first,  then the delsym as tolerated.   Finish augmentin  Prednisone 10 mg take  4 each am x 2 days,   2 each am x 2 days,  1 each am x 2 days and stop  Increase nexium to 40 mg Take 30-60 min before first meal of the day  Please schedule a follow up office visit in 2  weeks, sooner if needed     07/25/2014 f/u ov/Richard Giles re: cough variant asthma/off ACEi   07/11/14  Chief Complaint  Patient presents with  . Follow-up    Pt states that his SOB and cough are much improved. No new co's today.   no longer needing tramadol, min delsym, still on dulera 100 2bid  No obvious day to day or daytime variabilty or assoc  chest tightness, subjective wheeze overt sinus or hb symptoms. No unusual exp hx or h/o childhood pna/ asthma or knowledge of premature birth.   Also denies any obvious fluctuation of symptoms with weather or environmental changes or other aggravating or alleviating factors except as outlined above   Current Medications, Allergies, Complete Past Medical History, Past Surgical History, Family History, and Social History were reviewed in [employer] record.  ROS  The following are not active complaints unless bolded sore throat, dysphagia, dental problems, itching, sneezing,  nasal congestion or excess/ purulent secretions, ear ache,   fever, chills, sweats, unintended wt loss, pleuritic cp or exertional cp, hemoptysis,  orthopnea pnd or leg swelling, presyncope, palpitations, heartburn, abdominal pain, anorexia, nausea, vomiting, diarrhea  or change in bowel or urinary habits, change in stools or urine,  dysuria,hematuria,  rash, arthralgias, visual complaints, headache, numbness weakness or ataxia or problems with walking or coordination,  change in mood/affect or memory.             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  - Maybe trees > did not rec shots per pt  Chronic cough  - onset 2005 > resolved on ICS September 10, 2009  - Allergy profile sent Jul 08, 2009 >> neg  - Sinus Ct ordered Jul 08, 2009 >> neg  - Qvar trial Jul 08, 2009 >> much better July 30, 2009 with 90% effective hfa - flared on ACEi April 2016 >  resolved at f/u off acei 07/25/14          Objective:   Physical Exam   Obese amb wm nad /all smiles    07/11/2014       289   > 07/25/2014 288  Wt Readings from Last 3 Encounters:  09/02/12 298 lb (135.172 kg)  03/14/12 284 lb (128.822 kg)  02/03/12 281 lb (127.461 kg)     HEENT: nl dentition, turbinates, and orophanx. Nl external ear canals without cough reflex   NECK :  without JVD/Nodes/TM/ nl carotid upstrokes bilaterally   LUNGS: no acc muscle use, clear to A and P bilaterally without cough on insp or exp maneuvers   CV:  RRR  no s3 or murmur or increase in P2, no edema   ABD:  soft and nontender with nl excursion in the supine position. No bruits or organomegaly, bowel sounds nl  MS:  warm without deformities, calf tenderness, cyanosis or clubbing      I personally reviewed images and agree with radiology impression as follows:  CXR:  06/25/14  Low volume exam. No superimposed acute process or interval change   CXR 07/10/14 Normal bones and soft tissues. Moderate degenerative changes thoracic spine. Normal heart size. Normal pulmonary vasculature. Clear lungs. No pleural effusion     Assessment:

## 2014-07-29 ENCOUNTER — Encounter: Payer: Self-pay | Admitting: Internal Medicine

## 2014-07-29 NOTE — Assessment & Plan Note (Addendum)
Trial off acei 07/11/2014 due to cough/pseudoasthma> resolved  Marginally adequate control on present rx, reviewed > no change in rx needed  = avapro 150 mg daily,  Could add hctz if needed, defer to primary care  Note tolerating normodyne at present but low threshold to change to  Bystolic, the most beta -1  selective Beta blocker available in sample form, with bisoprolol the most selective generic choice  on the market., should cough or wheezing flare in future

## 2014-07-29 NOTE — Assessment & Plan Note (Addendum)
-  07/11/2014 p extensive coaching HFA effectiveness =    90% rec try dulera 100 2bid   I had an extended discussion with the patient reviewing all relevant studies completed to date and  lasting 15 to 20 minutes of a 25 minute visit on the following ongoing concerns:  All goals of chronic asthma control met including optimal function and elimination of symptoms with minimal need for rescue therapy.  Contingencies discussed in full including contacting this office immediately if not controlling the symptoms using the rule of two's.     Each maintenance medication was reviewed in detail including most importantly the difference between maintenance and as needed and under what circumstances the prns are to be used.  Please see instructions for details which were reviewed in writing and the patient given a copy.    If flares in future on qvar  with adequate technique this would suggest an alternative cause, in this case ACEi but  Could also be from use of normodyne 9see hbp) or non acid gerd related to obesity  Pulmonary f/u is prn

## 2014-07-30 ENCOUNTER — Encounter: Payer: Self-pay | Admitting: Internal Medicine

## 2014-07-30 ENCOUNTER — Ambulatory Visit (INDEPENDENT_AMBULATORY_CARE_PROVIDER_SITE_OTHER): Payer: 59 | Admitting: Internal Medicine

## 2014-07-30 VITALS — BP 158/96 | HR 103 | Temp 97.3°F | Ht 71.0 in | Wt 286.0 lb

## 2014-07-30 DIAGNOSIS — I10 Essential (primary) hypertension: Secondary | ICD-10-CM

## 2014-07-30 DIAGNOSIS — J45991 Cough variant asthma: Secondary | ICD-10-CM

## 2014-07-30 MED ORDER — FUROSEMIDE 20 MG PO TABS: 20.0000 mg | ORAL_TABLET | Freq: Every day | ORAL | Status: DC

## 2014-07-30 MED ORDER — PREDNISONE 10 MG PO TABS: ORAL_TABLET | ORAL | Status: DC

## 2014-07-30 MED ORDER — TRAMADOL HCL 50 MG PO TABS: ORAL_TABLET | ORAL | Status: DC

## 2014-07-30 MED ORDER — AMOXICILLIN-POT CLAVULANATE 875-125 MG PO TABS: 1.0000 | ORAL_TABLET | Freq: Two times a day (BID) | ORAL | Status: DC

## 2014-07-30 NOTE — Progress Notes (Signed)
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 ventolin may helps some but does not eliminate it. prednisone helps some, just finished prednisone last month and much worse since stopped it. minimal actual sputum production. assoc with mild chronic nasal congestion. rec Prednisone x 12 days  Nexium 40 mg Take one 30-60 min before first meal of the day  Pepcid 20 mg at bedtime  Delsym 2 tsp every 12 hours as needed for cough  Stop symbicort   Jul 08, 2009 2 wk followup. Pt states that cough had improved while on prednisone. When he finished prednisone a few days ago cough started to come back. Cough was prod this am with white/yellow sputum. This has occurred despite compliance with diet and ppi. rec add qvar 80 2 bid   July 30, 2009 3 wk followup. Pt states that his cough has resolved.> rec  no change rx  12/12/10 ov/ [name] cc cough resolved completely on qvar and wants it refilled,   No doe >refill qvar , singulair d/c   07/02/2011 Acute OV  Complains of 3 weeks of initial URI that has progressed with increased prod cough with white/yellow mucus, increased SOB, wheezing, tightness in chest. Worse for last 5 days Was called in steroid pack that he finished this am. Prednisone helped only minimally.  Now coughing up thick yellow mucus.  No hemotpysis or chest pain . No edema.  Was under good control with cough until last 3 days.  Using delsym for cough.  Restarted singulair this week.  Restarted Nexium/Pepcid 2 weeks ago.  Has been  using mint cough drops- " forgot about avoiding mints"  rec Zpack take as directed.  Delsym 2 teaspoons twice daily as needed for cough. Avoid Mint products.  Hydromet 1-2 teaspoons every 4-6 hours as needed for cough, may make you sleepy. Use sugarless candy, water, ice chips to help avoid coughing   09/03/2011 f/u ov/[name] cc Patient states better since last visit. States cough is better.  rec Plan A = Qvar 80 Take 2 puffs first thing in am and then another 2 puffs about 12 hours later and blow it out through the nose. Plan B Only use your albuterol as a rescue medication to be used if you can't catch your breath by resting or doing a relaxed purse lip breathing pattern. The less you use it, the better it will work when you need it.  Only use your allegra as a "rescue medication" for your nasal symptoms Try Nexium 40 (prilosec 20mg  )   Take 30-60 min before first meal of the day and Pepcid 20 mg one bedtime until cough is completely gone for at least a week without the need for cough suppression   09/02/2012 f/u ov/[name] re cough variant asthma/ good control on qvar/ here for yearly f/u/ refills Chief Complaint  Patient presents with  . Follow-up    Pt states doing well and denies any co's today.   Refill qvar 80 / f/u  prn   07/11/2014 f/u ov/[name] re: recurrent cough  Chief Complaint  Patient presents with  . Acute Visit    Pt c/o "cold" for the past 5 wks- now having increased SOB, acid reflux and cough. Cough is prod with white/blood tinged sputum.    qvar 80 2 puffs each am/  nexium /pepcid whenever cough flared which was very rare until around mid April 2016 while on ACEi felt like caught wife's cold with low grade fever and barking quality cough and rx w/in a week started nex/pepcid and took round of pred plus delsym seen by Dr Deborra Medina 06/25/14 rx zpak then May 16 augmentin > to [facility] 07/10/14 p coughing fit > rib injury on L but neg cxr rx tramadol but not taking aggressively. rec Stop  lisinopril avapro (ibesartan 150 mg) one daily in place of lisinopril 40  stop qvar and start dulera 100 Take 2 puffs first thing in am and then another 2 puffs about 12 hours later.  Take delsym two tsp every 12 hours and supplement if needed with  tramadol 50 mg up to 2 every 4 hours   Once you have eliminated the cough for 3 straight days try reducing the tramadol first,  then the delsym as tolerated.   Finish augmentin  Prednisone 10 mg take  4 each am x 2 days,   2 each am x 2 days,  1 each am x 2 days and stop  Increase nexium to 40 mg Take 30-60 min before first meal of the day  Please schedule a follow up office visit in 2  weeks, sooner if needed     07/25/2014 f/u ov/[name] re: cough variant asthma/off ACEi   07/11/14  Chief Complaint  Patient presents with  . Follow-up    Pt states that his SOB and cough are much improved. No new co's today.   no longer needing tramadol, min delsym, still on dulera 100 2bid rec Whenever you are coughing for any reason or needing delsym stay on acid suppression for at least 2 weeks symptom free then ok to taper off  I will let Dr Joelyn Oms know why we changed your blood pressure pill  Ok to resume previous dose of qvar and stop dulera   07/30/2014 f/u ov/[name] re: relapse cough since 07/26/14  Chief Complaint  Patient presents with  . Follow-up    Pt c/o prod cough with green thick mucus, increased SOB, low  grade fever with chills at bedtime. Chest congestion.      No obvious day to day or daytime variabilty or assoc  chest tightness, subjective wheeze overt  hb symptoms. No unusual exp hx or h/o childhood pna/ asthma or knowledge of premature birth.   Also denies any obvious fluctuation of symptoms with weather or environmental changes or other aggravating or alleviating factors except as outlined above   Current Medications, Allergies, Complete Past Medical History, Past Surgical History, Family History, and Social History were reviewed in  [employer] record.  ROS  The following are not active complaints unless bolded sore throat, dysphagia, dental problems, itching, sneezing,  nasal congestion or excess/ purulent secretions, ear ache,   fever, chills, sweats, unintended wt loss, pleuritic cp or exertional cp, hemoptysis,  orthopnea pnd or leg swelling, presyncope, palpitations, heartburn, abdominal pain, anorexia, nausea, vomiting, diarrhea  or change in bowel or urinary habits, change in stools or urine, dysuria,hematuria,  rash, arthralgias, visual complaints, headache, numbness weakness or ataxia or problems with  walking or coordination,  change in mood/affect or memory.             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  - Maybe trees > did not rec shots per pt  Chronic cough  - onset 2005 > resolved on ICS September 10, 2009  - Allergy profile sent Jul 08, 2009 >> neg  - Sinus Ct ordered Jul 08, 2009 >> neg  - Qvar trial Jul 08, 2009 >> much better July 30, 2009 with 90% effective hfa - flared on ACEi April 2016 > resolved at f/u off acei 07/25/14          Objective:   Physical Exam   Obese amb wm nad / nasal tone / honking upper airway cough    07/11/2014       289   > 07/25/2014 288 > 07/30/2014 286  Wt Readings from Last 3 Encounters:  09/02/12 298 lb (135.172 kg)  03/14/12 284 lb (128.822 kg)  02/03/12 281 lb (127.461 kg)     HEENT: nl dentition, turbinates, and orophanx. Nl external ear canals without cough reflex   NECK :  without JVD/Nodes/TM/ nl carotid upstrokes bilaterally   LUNGS: no acc muscle use, clear to A and P bilaterally without cough on insp or exp maneuvers   CV:  RRR  no s3 or murmur or increase in P2, no edema   ABD:  soft and nontender with nl excursion in the supine position. No bruits or organomegaly, bowel sounds nl  MS:  warm without deformities, calf tenderness, cyanosis or clubbing      I personally reviewed images and agree with  radiology impression as follows:    CXR 07/10/14 Normal bones and soft tissues. Moderate degenerative changes thoracic spine. Normal heart size. Normal pulmonary vasculature. Clear lungs. No pleural effusion     Assessment:

## 2014-07-30 NOTE — Patient Instructions (Addendum)
Augmentin 875 mg take one pill twice daily  X 10 days - take at breakfast and supper with large glass of water.  It would help reduce the usual side effects (diarrhea and yeast infections) if you ate cultured yogurt at lunch.   Please see patient coordinator before you leave today  to schedule sinus CT in 10 days no sooner.  For cough delsym/flutter valve and if all else fails take tramadol  Prednisone 10 mg take  4 each am x 2 days,   2 each am x 2 days,  1 each am x 2 days and stop   Resume dulera until better then try qvar  Add lasix 20 mg daily   needs repeat bmet in one week

## 2014-08-05 ENCOUNTER — Encounter: Payer: Self-pay | Admitting: Internal Medicine

## 2014-08-05 NOTE — Assessment & Plan Note (Addendum)
-   07/11/2014 p extensive coaching HFA effectiveness =    90% rec try dulera 100 2bid - 07/26/14 cough resolved off acei > retry qvar 80 bid  - 07/31/14 > cough flared so rec dulera 100 2bid     Chronic cough is often simultaneously caused by more than one condition. A single cause has been found from 38 to 82% of the time, multiple causes from 18 to 62%. Multiply caused cough has been the result of three diseases up to 42% of the time.   Clearly there is more here than just an acei cough ? Underlying sinus dz / cough variant asthma/ gerd  rec Max rx for sinus dz with augmentin x 10 days then sinus ct Maintain off acei Change back to dulera for now  Continue max gerd rx plus flutter for cyclical airway trauma Each maintenance medication was reviewed in detail including most importantly the difference between maintenance and as needed and under what circumstances the prns are to be used.  Please see instructions for details which were reviewed in writing and the patient given a copy.    Total time counseling 20 min re dx/ management

## 2014-08-05 NOTE — Assessment & Plan Note (Addendum)
Trial off acei 07/11/2014 due to cough/pseudoasthma flared w/in 3 weeks off so still may have acei related symptoms   Not Adequate control on present rx, reviewed options rec add lasix 20 mg daily as has h/o gout so avoid hctz or arb/hctz combos  Note creat elevated so needs repeat bmet in one week

## 2014-08-06 ENCOUNTER — Telehealth: Payer: Self-pay | Admitting: *Deleted

## 2014-08-06 DIAGNOSIS — I10 Essential (primary) hypertension: Secondary | ICD-10-CM

## 2014-08-06 NOTE — Telephone Encounter (Signed)
Spoke with pt.  Discussed below.  Order placed.  Pt aware and will have lab done this week.

## 2014-08-06 NOTE — Telephone Encounter (Signed)
LMTCB

## 2014-08-06 NOTE — Telephone Encounter (Signed)
-----   Message from Tanda Rockers, MD sent at 08/05/2014  3:04 PM EDT -----  needs repeat bmet this week either her or at Dr Hulen Shouts office

## 2014-08-08 ENCOUNTER — Telehealth: Payer: Self-pay | Admitting: Family Medicine

## 2014-08-08 ENCOUNTER — Encounter: Payer: Self-pay | Admitting: Primary Care

## 2014-08-08 ENCOUNTER — Other Ambulatory Visit (INDEPENDENT_AMBULATORY_CARE_PROVIDER_SITE_OTHER): Payer: 59

## 2014-08-08 ENCOUNTER — Ambulatory Visit (INDEPENDENT_AMBULATORY_CARE_PROVIDER_SITE_OTHER): Payer: 59 | Admitting: Primary Care

## 2014-08-08 VITALS — BP 184/106 | HR 95 | Temp 97.5°F | Ht 71.0 in | Wt 294.4 lb

## 2014-08-08 DIAGNOSIS — I10 Essential (primary) hypertension: Secondary | ICD-10-CM

## 2014-08-08 LAB — BASIC METABOLIC PANEL
BUN: 18 mg/dL (ref 6–23)
CO2: 32 mEq/L (ref 19–32)
Calcium: 9 mg/dL (ref 8.4–10.5)
Chloride: 103 mEq/L (ref 96–112)
Creatinine, Ser: 1.36 mg/dL (ref 0.40–1.50)
GFR: 58.13 mL/min — ABNORMAL LOW (ref >60.00)
Glucose, Bld: 121 mg/dL — ABNORMAL HIGH (ref 70–99)
Potassium: 4.2 mEq/L (ref 3.5–5.1)
Sodium: 138 mEq/L (ref 135–145)

## 2014-08-08 MED ORDER — AMLODIPINE BESYLATE 10 MG PO TABS: 10.0000 mg | ORAL_TABLET | Freq: Every day | ORAL | Status: DC

## 2014-08-08 MED ORDER — HYDROCHLOROTHIAZIDE 25 MG PO TABS: 25.0000 mg | ORAL_TABLET | Freq: Every day | ORAL | Status: DC

## 2014-08-08 NOTE — Patient Instructions (Addendum)
Start Amlodipine 10 mg tablets. 1 tablet by mouth daily.  Start Aspirin 81 mg tablets daily.  Check your blood pressure twice daily and ensure you have rested for at least 15 minutes before you take it. Record your readings and bring them to your next appointment.  DASH Eating Plan DASH stands for "Dietary Approaches to Stop Hypertension." The DASH eating plan is a healthy eating plan that has been shown to reduce high blood pressure (hypertension). Additional health benefits may include reducing the risk of type 2 diabetes mellitus, heart disease, and stroke. The DASH eating plan may also help with weight loss. WHAT DO I NEED TO KNOW ABOUT THE DASH EATING PLAN? For the DASH eating plan, you will follow these general guidelines:  Choose foods with a percent daily value for sodium of less than 5% (as listed on the food label).  Use salt-free seasonings or herbs instead of table salt or sea salt.  Check with your health care provider or pharmacist before using salt substitutes.  Eat lower-sodium products, often labeled as "lower sodium" or "no salt added."  Eat fresh foods.  Eat more vegetables, fruits, and low-fat dairy products.  Choose whole grains. Look for the word "whole" as the first word in the ingredient list.  Choose fish and skinless chicken or [place] more often than red meat. Limit fish, poultry, and meat to 6 oz (170 g) each day.  Limit sweets, desserts, sugars, and sugary drinks.  Choose heart-healthy fats.  Limit cheese to 1 oz (28 g) per day.  Eat more home-cooked food and less restaurant, buffet, and fast food.  Limit fried foods.  Cook foods using methods other than frying.  Limit canned vegetables. If you do use them, rinse them well to decrease the sodium.  When eating at a restaurant, ask that your food be prepared with less salt, or no salt if possible. WHAT FOODS CAN I EAT? Seek help from a dietitian for individual calorie needs. Grains Whole grain or  whole wheat bread. Brown rice. Whole grain or whole wheat pasta. Quinoa, bulgur, and whole grain cereals. Low-sodium cereals. Corn or whole wheat flour tortillas. Whole grain cornbread. Whole grain crackers. Low-sodium crackers. Vegetables Fresh or frozen vegetables (raw, steamed, roasted, or grilled). Low-sodium or reduced-sodium tomato and vegetable juices. Low-sodium or reduced-sodium tomato sauce and paste. Low-sodium or reduced-sodium canned vegetables.  Fruits All fresh, canned (in natural juice), or frozen fruits. Meat and Other Protein Products Ground beef (85% or leaner), grass-fed beef, or beef trimmed of fat. Skinless chicken or [place]. Ground chicken or [place]. Pork trimmed of fat. All fish and seafood. Eggs. Dried beans, peas, or lentils. Unsalted nuts and seeds. Unsalted canned beans. Dairy Low-fat dairy products, such as skim or 1% milk, 2% or reduced-fat cheeses, low-fat ricotta or cottage cheese, or plain low-fat yogurt. Low-sodium or reduced-sodium cheeses. Fats and Oils Tub margarines without trans fats. Light or reduced-fat mayonnaise and salad dressings (reduced sodium). Avocado. Safflower, olive, or canola oils. Natural peanut or almond butter. Other Unsalted popcorn and pretzels. The items listed above may not be a complete list of recommended foods or beverages. Contact your dietitian for more options. WHAT FOODS ARE NOT RECOMMENDED? Grains White bread. White pasta. White rice. Refined cornbread. Bagels and croissants. Crackers that contain trans fat. Vegetables Creamed or fried vegetables. Vegetables in a cheese sauce. Regular canned vegetables. Regular canned tomato sauce and paste. Regular tomato and vegetable juices. Fruits Dried fruits. Canned fruit in light or heavy syrup. Fruit  juice. Meat and Other Protein Products Fatty cuts of meat. Ribs, chicken wings, bacon, sausage, bologna, salami, chitterlings, fatback, hot dogs, bratwurst, and packaged luncheon meats.  Salted nuts and seeds. Canned beans with salt. Dairy Whole or 2% milk, cream, half-and-half, and cream cheese. Whole-fat or sweetened yogurt. Full-fat cheeses or blue cheese. Nondairy creamers and whipped toppings. Processed cheese, cheese spreads, or cheese curds. Condiments Onion and garlic salt, seasoned salt, table salt, and sea salt. Canned and packaged gravies. Worcestershire sauce. Tartar sauce. Barbecue sauce. Teriyaki sauce. Soy sauce, including reduced sodium. Steak sauce. Fish sauce. Oyster sauce. Cocktail sauce. Horseradish. Ketchup and mustard. Meat flavorings and tenderizers. Bouillon cubes. Hot sauce. Tabasco sauce. Marinades. Taco seasonings. Relishes. Fats and Oils Butter, stick margarine, lard, shortening, ghee, and bacon fat. Coconut, palm kernel, or palm oils. Regular salad dressings. Other Pickles and olives. Salted popcorn and pretzels. The items listed above may not be a complete list of foods and beverages to avoid. Contact your dietitian for more information. WHERE CAN I FIND MORE INFORMATION? National Heart, Lung, and Blood Institute: travelstabloid.com Document Released: 01/29/2011 Document Revised: 06/26/2013 Document Reviewed: 12/14/2012 [facility] Patient Information 2015 [place], [place]. This information is not intended to replace advice given to you by your health care provider. Make sure you discuss any questions you have with your health care provider.   Follow up in one week for re-evaluation.  It was nice meeting you!

## 2014-08-08 NOTE — Progress Notes (Signed)
Subjective:    Patient ID: Richard Giles, male    DOB: March 02, 1960, 54 y.o.   MRN: [id number]  HPI  Richard Giles is a 54 year old male with a chief complaint of hypertension. He was managed on lisinopril for HTN but was removed due to severe cough/"pseudoasthma". He was placed on Avapro in early June to replace lisinopril. About 2 weeks ago he started noticing his blood pressure becoming elevated.  He's checking his blood pressure once every few days and has been getting readings of 170's-180's/100's. He's been experiencing headaches over the past several days. Denies chest pain, shortness of breath, blurred vision, recent stress.  Review of Systems  Eyes: Negative for visual disturbance.  Respiratory: Negative for shortness of breath.   Cardiovascular: Negative for chest pain.  Neurological: Positive for headaches. Negative for dizziness.       Past Medical History  Diagnosis Date  . Allergy     allergy workup with Dr. Caprice Red : Maybe trees Did not receive shots per patient  . Chronic cough onset 2005/ resolved on ICS July 19,2011    Sinus CT negative 07/08/2009 negative  . Chronic bronchitis   . GERD (gastroesophageal reflux disease)   . Hyperlipidemia   . Restless leg syndrome   . Sleep apnea     does not have to use cpap  . Diabetes mellitus without complication     History   Social History  . Marital Status: Married    Spouse Name: N/A  . Number of Children: N/A  . Years of Education: N/A   Occupational History  . Not on file.   Social History Main Topics  . Smoking status: Never Smoker   . Smokeless tobacco: Former Systems developer    Quit date: 02/23/1993  . Alcohol Use: No  . Drug Use: No  . Sexual Activity: Not on file   Other Topics Concern  . Not on file   Social History Narrative    Past Surgical History  Procedure Laterality Date  . Foot surgery  09/03/2003    Left foot osteophye removal (Dr. Marlou Sa)  . Septoplasty  1983    at 54 years of age  .  Bialteral inguinal hernia  1967    Family History  Problem Relation Age of Onset  . Hypertension Father   . Diabetes Maternal Grandmother   . Depression Neg Hx   . Drug abuse Neg Hx   . Stroke Neg Hx   . Colon cancer Neg Hx   . Stomach cancer Neg Hx     Allergies  Allergen Reactions  . Bee Venom Anaphylaxis  . Codeine Diarrhea and Nausea And Vomiting  . Lisinopril Cough    Current Outpatient Prescriptions on File Prior to Visit  Medication Sig Dispense Refill  . acetaminophen (TYLENOL) 500 MG tablet Take 1,000 mg by mouth every 6 (six) hours as needed (pain).     Marland Kitchen allopurinol (ZYLOPRIM) 300 MG tablet TAKE 1 TABLET BY MOUTH DAILY 30 tablet 5  . amoxicillin-clavulanate (AUGMENTIN) 875-125 MG per tablet Take 1 tablet by mouth 2 (two) times daily. 20 tablet 0  . atorvastatin (LIPITOR) 20 MG tablet TAKE 1 TABLET BY MOUTH DAILY 30 tablet 5  . dextromethorphan (DELSYM) 30 MG/5ML liquid Take 60 mg by mouth daily as needed for cough.     . famotidine (PEPCID) 20 MG tablet Take 20 mg by mouth at bedtime.     . fexofenadine (ALLEGRA) 180 MG tablet Take 180 mg by  mouth daily.     . furosemide (LASIX) 20 MG tablet Take 1 tablet (20 mg total) by mouth daily. 30 tablet 11  . irbesartan (AVAPRO) 150 MG tablet Take 1 tablet (150 mg total) by mouth daily. 30 tablet 2  . pantoprazole (PROTONIX) 40 MG tablet Take 1 tablet (40 mg total) by mouth daily. 30 tablet 2  . Probiotic Product (PROBIOTIC PO) Take 1 tablet by mouth daily.    Marland Kitchen Respiratory Therapy Supplies (FLUTTER) DEVI Use as directed 1 each 0  . traMADol (ULTRAM) 50 MG tablet 1-2 every 4 hours as needed for cough or pain 40 tablet 0   No current facility-administered medications on file prior to visit.    BP 184/106 mmHg  Pulse 95  Temp(Src) 97.5 F (36.4 C) (Oral)  Ht 5\' 11"  (1.803 m)  Wt 294 lb 6.4 oz (133.539 kg)  BMI 41.08 kg/m2  SpO2 98%    Objective:   Physical Exam  Cardiovascular: Normal rate and regular rhythm.     No lower extremity edema  Pulmonary/Chest: Effort normal and breath sounds normal.  Skin: Skin is warm and dry.          Assessment & Plan:

## 2014-08-08 NOTE — Telephone Encounter (Signed)
Pt has appt 08/08/14 at 4 pm with Allie Bossier NP.

## 2014-08-08 NOTE — Assessment & Plan Note (Addendum)
BP elevated in office today and has been over past several weeks. No cough with Avapro. Continue same. Will add amlodipine 10 mg tablets for further control. Will not do HCTZ due to history of gout. Follow up next week for re-evaluation.

## 2014-08-08 NOTE — Progress Notes (Signed)
Quick Note:  Called and spoke with pt. Reviewed results and recs. Pt states his BP had been elevated since Dr. Melvyn Novas changed his rx. He is aware he needs to contact Dr. Joelyn Oms. Pt voiced understanding and had no further questions. ______

## 2014-08-08 NOTE — Telephone Encounter (Signed)
Patient Name: Richard Giles  DOB: 07-Oct-1960    Initial Comment Caller states he has a blood pressure of 173/112   Nurse Assessment  Nurse: Thad Ranger, RN, Denise Date/Time (Eastern Time): 08/08/2014 3:28:50 PM  Confirm and document reason for call. If symptomatic, describe symptoms. ---Caller states he has a blood pressure of 173/112 taken approx 30 mins ago in the left arm. States the MD just changed his med from Lisinopril 40mg  (d/c'ed d/t a cough). He is now taking Ibesartan (Avapro) 150mg  po qd. States the change in med occured approx 2 wks ago. Had him recheck his BP while on the phone and it is 171/108 HR 103. Denies SOB, dizziness, but states he has a HA.  Has the patient traveled out of the country within the last 30 days? ---Not Applicable  Does the patient require triage? ---Yes  Related visit to physician within the last 2 weeks? ---Yes  Does the PT have any chronic conditions? (i.e. diabetes, asthma, etc.) ---Yes  List chronic conditions. ---HTN, IGA     Guidelines    Guideline Title Affirmed Question Affirmed Notes  High Blood Pressure BP ? 180/110    Final Disposition User   See Physician within 24 Hours Carmon, RN, Langley Gauss    Comments  Pt refuses to allow RN to make an appt for tomorrow. States he will be OOT on business tomorrow. Wants to be seen by MD this eve and going to [facility]

## 2014-08-08 NOTE — Progress Notes (Signed)
Pre visit review using our clinic review tool, if applicable. No additional management support is needed unless otherwise documented below in the visit note. 

## 2014-08-09 ENCOUNTER — Telehealth: Payer: Self-pay

## 2014-08-09 NOTE — Telephone Encounter (Signed)
[place] at [place] left v/m;[facility] ran prescription for BP monitor since pt has a HSA card that would pay for monitor; if this is not [place] request cb. If OK does not need cb.

## 2014-08-09 NOTE — Telephone Encounter (Signed)
That sounds great. Thanks.

## 2014-08-10 ENCOUNTER — Ambulatory Visit (INDEPENDENT_AMBULATORY_CARE_PROVIDER_SITE_OTHER)
Admission: RE | Admit: 2014-08-10 | Discharge: 2014-08-10 | Disposition: A | Discharge status: Home / Self Care | Payer: 59 | Source: Ambulatory Visit | Attending: Internal Medicine | Admitting: Internal Medicine

## 2014-08-10 DIAGNOSIS — J45991 Cough variant asthma: Secondary | ICD-10-CM

## 2014-08-13 NOTE — Progress Notes (Signed)
Quick Note:  Spoke with pt and notified of results per Dr. [name]. Pt verbalized understanding and denied any questions.  ______ 

## 2014-08-20 ENCOUNTER — Ambulatory Visit (INDEPENDENT_AMBULATORY_CARE_PROVIDER_SITE_OTHER): Payer: 59 | Admitting: Family Medicine

## 2014-08-20 ENCOUNTER — Encounter: Payer: Self-pay | Admitting: Family Medicine

## 2014-08-20 VITALS — BP 128/82 | HR 97 | Temp 99.0°F | Wt 290.5 lb

## 2014-08-20 DIAGNOSIS — I10 Essential (primary) hypertension: Secondary | ICD-10-CM | POA: Diagnosis not present

## 2014-08-20 NOTE — Assessment & Plan Note (Signed)
Well controlled with addition of amlodipine. No changes made. Call or return to clinic prn if these symptoms worsen or fail to improve as anticipated. The patient indicates understanding of these issues and agrees with the plan.

## 2014-08-20 NOTE — Progress Notes (Signed)
Subjective:   Patient ID: Richard Giles, male    DOB: 05-04-1960, 54 y.o.   MRN: [id number]  Richard Giles is a pleasant 54 y.o. year old male who presents to clinic today with Follow-up  on 08/20/2014  HPI:  Here to follow up HTN.  BP had been well controlled on Lisinopril but stopped by pulmonary and placed on Avapro (Dr. Melvyn Novas). Saw Owens Shark in our office on 6/15 due to increasing blood pressures- note reviewed.  She continued his avapro and added amlodipine 10 mg daily and advised him to follow up with me here today.  BPs have been improving.  Denies any LE edema, CP or SOB (more than baseline).  Lab Results  Component Value Date   CREATININE 1.36 08/08/2014    Current Outpatient Prescriptions on File Prior to Visit  Medication Sig Dispense Refill  . acetaminophen (TYLENOL) 500 MG tablet Take 1,000 mg by mouth every 6 (six) hours as needed (pain).     Marland Kitchen allopurinol (ZYLOPRIM) 300 MG tablet TAKE 1 TABLET BY MOUTH DAILY 30 tablet 5  . amLODipine (NORVASC) 10 MG tablet Take 1 tablet (10 mg total) by mouth daily. 30 tablet 2  . atorvastatin (LIPITOR) 20 MG tablet TAKE 1 TABLET BY MOUTH DAILY 30 tablet 5  . dextromethorphan (DELSYM) 30 MG/5ML liquid Take 60 mg by mouth daily as needed for cough.     . famotidine (PEPCID) 20 MG tablet Take 20 mg by mouth at bedtime.     . fexofenadine (ALLEGRA) 180 MG tablet Take 180 mg by mouth daily.     . furosemide (LASIX) 20 MG tablet Take 1 tablet (20 mg total) by mouth daily. 30 tablet 11  . irbesartan (AVAPRO) 150 MG tablet Take 1 tablet (150 mg total) by mouth daily. 30 tablet 2  . pantoprazole (PROTONIX) 40 MG tablet Take 1 tablet (40 mg total) by mouth daily. 30 tablet 2  . Probiotic Product (PROBIOTIC PO) Take 1 tablet by mouth daily.    Marland Kitchen Respiratory Therapy Supplies (FLUTTER) DEVI Use as directed 1 each 0  . traMADol (ULTRAM) 50 MG tablet 1-2 every 4 hours as needed for cough or pain 40 tablet 0   No current facility-administered  medications on file prior to visit.    Allergies  Allergen Reactions  . Bee Venom Anaphylaxis  . Codeine Diarrhea and Nausea And Vomiting  . Lisinopril Cough    Past Medical History  Diagnosis Date  . Allergy     allergy workup with Dr. Caprice Red : Maybe trees Did not receive shots per patient  . Chronic cough onset 2005/ resolved on ICS July 19,2011    Sinus CT negative 07/08/2009 negative  . Chronic bronchitis   . GERD (gastroesophageal reflux disease)   . Hyperlipidemia   . Restless leg syndrome   . Sleep apnea     does not have to use cpap  . Diabetes mellitus without complication     Past Surgical History  Procedure Laterality Date  . Foot surgery  09/03/2003    Left foot osteophye removal (Dr. Marlou Sa)  . Septoplasty  1983    at 54 years of age  . Bialteral inguinal hernia  1967    Family History  Problem Relation Age of Onset  . Hypertension Father   . Diabetes Maternal Grandmother   . Depression Neg Hx   . Drug abuse Neg Hx   . Stroke Neg Hx   . Colon cancer Neg Hx   .  Stomach cancer Neg Hx     History   Social History  . Marital Status: Married    Spouse Name: N/A  . Number of Children: N/A  . Years of Education: N/A   Occupational History  . Not on file.   Social History Main Topics  . Smoking status: Never Smoker   . Smokeless tobacco: Former Systems developer    Quit date: 02/23/1993  . Alcohol Use: No  . Drug Use: No  . Sexual Activity: Not on file   Other Topics Concern  . Not on file   Social History Narrative   The PMH, PSH, Social History, Family History, Medications, and allergies have been reviewed in [facility], and have been updated if relevant.     Review of Systems  Constitutional: Negative.   HENT: Negative.   Respiratory: Negative.   Cardiovascular: Negative.   Gastrointestinal: Negative.   Genitourinary: Negative.   Musculoskeletal: Negative.   Skin: Negative.   Neurological: Negative.   Psychiatric/Behavioral: Negative.     All other systems reviewed and are negative.      Objective:    BP 128/82 mmHg  Pulse 97  Temp(Src) 99 F (37.2 C) (Oral)  Wt 290 lb 8 oz (131.77 kg)  SpO2 99%   Physical Exam  Constitutional: He is oriented to person, place, and time. He appears well-developed and well-nourished. No distress.  HENT:  Head: Normocephalic.  Eyes: Conjunctivae are normal.  Neck: Normal range of motion.  Cardiovascular: Normal rate and regular rhythm.   Pulmonary/Chest: Effort normal and breath sounds normal.  Musculoskeletal: Normal range of motion. He exhibits no edema.  Neurological: He is alert and oriented to person, place, and time.  Skin: Skin is warm and dry.  Psychiatric: He has a normal mood and affect. His behavior is normal. Judgment and thought content normal.  Nursing note and vitals reviewed.         Assessment & Plan:   Essential hypertension No Follow-up on file.

## 2014-08-20 NOTE — Progress Notes (Signed)
Pre visit review using our clinic review tool, if applicable. No additional management support is needed unless otherwise documented below in the visit note. 

## 2014-12-31 ENCOUNTER — Encounter: Payer: Self-pay | Admitting: Internal Medicine

## 2015-01-04 ENCOUNTER — Ambulatory Visit (AMBULATORY_SURGERY_CENTER): Payer: Self-pay | Admitting: *Deleted

## 2015-01-04 VITALS — Ht 71.0 in | Wt 304.0 lb

## 2015-01-04 DIAGNOSIS — Z86010 Personal history of colonic polyps: Secondary | ICD-10-CM

## 2015-01-04 MED ORDER — NA SULFATE-K SULFATE-MG SULF 17.5-3.13-1.6 GM/177ML PO SOLN: 1.0000 | Freq: Once | ORAL | Status: DC

## 2015-01-04 NOTE — Progress Notes (Signed)
No egg or soy allergy No issues with past sedation No diet pills No home 02 use emmi video declined  

## 2015-01-11 ENCOUNTER — Other Ambulatory Visit: Payer: Self-pay | Admitting: Family Medicine

## 2015-01-11 NOTE — Telephone Encounter (Signed)
Medication sent electronically 

## 2015-01-11 NOTE — Telephone Encounter (Signed)
Last lipid panel abnormal. pls advise 

## 2015-01-14 ENCOUNTER — Ambulatory Visit (AMBULATORY_SURGERY_CENTER): Payer: 59 | Admitting: Internal Medicine

## 2015-01-14 ENCOUNTER — Encounter: Payer: Self-pay | Admitting: Internal Medicine

## 2015-01-14 VITALS — BP 110/62 | HR 87 | Temp 96.7°F | Resp 17 | Ht 71.0 in | Wt 304.0 lb

## 2015-01-14 DIAGNOSIS — D122 Benign neoplasm of ascending colon: Secondary | ICD-10-CM | POA: Diagnosis not present

## 2015-01-14 DIAGNOSIS — K621 Rectal polyp: Secondary | ICD-10-CM | POA: Diagnosis not present

## 2015-01-14 DIAGNOSIS — D123 Benign neoplasm of transverse colon: Secondary | ICD-10-CM | POA: Diagnosis not present

## 2015-01-14 DIAGNOSIS — Z86010 Personal history of colonic polyps: Secondary | ICD-10-CM

## 2015-01-14 DIAGNOSIS — D129 Benign neoplasm of anus and anal canal: Secondary | ICD-10-CM

## 2015-01-14 DIAGNOSIS — D128 Benign neoplasm of rectum: Secondary | ICD-10-CM

## 2015-01-14 HISTORY — PX: COLONOSCOPY: SHX174

## 2015-01-14 MED ORDER — SODIUM CHLORIDE 0.9 % IV SOLN: 500.0000 mL | INTRAVENOUS | Status: DC

## 2015-01-14 NOTE — Progress Notes (Signed)
A/ox3, pleased with MAC, report to RN 

## 2015-01-14 NOTE — Patient Instructions (Signed)
YOU HAD AN ENDOSCOPIC PROCEDURE TODAY AT THE  ENDOSCOPY CENTER:   Refer to the procedure report that was given to you for any specific questions about what was found during the examination.  If the procedure report does not answer your questions, please call your gastroenterologist to clarify.  If you requested that your care partner not be given the details of your procedure findings, then the procedure report has been included in a sealed envelope for you to review at your convenience later.  YOU SHOULD EXPECT: Some feelings of bloating in the abdomen. Passage of more gas than usual.  Walking can help get rid of the air that was put into your GI tract during the procedure and reduce the bloating. If you had a lower endoscopy (such as a colonoscopy or flexible sigmoidoscopy) you may notice spotting of blood in your stool or on the toilet paper. If you underwent a bowel prep for your procedure, you may not have a normal bowel movement for a few days.  Please Note:  You might notice some irritation and congestion in your nose or some drainage.  This is from the oxygen used during your procedure.  There is no need for concern and it should clear up in a day or so.  SYMPTOMS TO REPORT IMMEDIATELY:   Following lower endoscopy (colonoscopy or flexible sigmoidoscopy):  Excessive amounts of blood in the stool  Significant tenderness or worsening of abdominal pains  Swelling of the abdomen that is new, acute  Fever of 100F or higher   For urgent or emergent issues, a gastroenterologist can be reached at any hour by calling [phone].   DIET: Your first meal following the procedure should be a small meal and then it is ok to progress to your normal diet. Heavy or fried foods are harder to digest and may make you feel nauseous or bloated.  Likewise, meals heavy in dairy and vegetables can increase bloating.  Drink plenty of fluids but you should avoid alcoholic beverages for 24  hours.  ACTIVITY:  You should plan to take it easy for the rest of today and you should NOT DRIVE or use heavy machinery until tomorrow (because of the sedation medicines used during the test).    FOLLOW UP: Our staff will call the number listed on your records the next business day following your procedure to check on you and address any questions or concerns that you may have regarding the information given to you following your procedure. If we do not reach you, we will leave a message.  However, if you are feeling well and you are not experiencing any problems, there is no need to return our call.  We will assume that you have returned to your regular daily activities without incident.  If any biopsies were taken you will be contacted by phone or by letter within the next 1-3 weeks.  Please call us at [phone] if you have not heard about the biopsies in 3 weeks.    SIGNATURES/CONFIDENTIALITY: You and/or your care partner have signed paperwork which will be entered into your electronic medical record.  These signatures attest to the fact that that the information above on your After Visit Summary has been reviewed and is understood.  Full responsibility of the confidentiality of this discharge information lies with you and/or your care-partner. 

## 2015-01-14 NOTE — Progress Notes (Signed)
Called to room to assist during endoscopic procedure.  Patient ID and intended procedure confirmed with present staff. Received instructions for my participation in the procedure from the performing physician.  

## 2015-01-14 NOTE — Op Note (Signed)
[place]  [address] [place], [id number]   COLONOSCOPY PROCEDURE REPORT  PATIENT: Richard Giles, Richard Giles  MR#: [id number] BIRTHDATE: [birth year] , 27  yrs. old GENDER: male ENDOSCOPIST: Jerene Bears, MD PROCEDURE DATE:  01/14/2015 PROCEDURE:   Colonoscopy, surveillance , Colonoscopy with snare polypectomy, and Colonoscopy with cold biopsy polypectomy First Screening Colonoscopy - Avg.  risk and is 50 yrs.  old or older - No.  Prior Negative Screening - Now for repeat screening. N/A  History of Adenoma - Now for follow-up colonoscopy & has been > or = to 3 yrs.  Yes hx of adenoma.  Has been 3 or more years since last colonoscopy.  Polyps removed today? Yes Polyps Removed Today ASA CLASS:   Class III INDICATIONS:Surveillance due to prior colonic neoplasia and PH Colon Adenoma, colonoscopy 2013. MEDICATIONS: Monitored anesthesia care and Propofol 300 mg IV  DESCRIPTION OF PROCEDURE:   After the risks benefits and alternatives of the procedure were thoroughly explained, informed consent was obtained.  The digital rectal exam revealed no rectal mass.   The LB [id number]  endoscope was introduced through the anus and advanced to the cecum, which was identified by both the appendix and ileocecal valve. No adverse events experienced. The quality of the prep was good.  (Suprep was used)  The instrument was then slowly withdrawn as the colon was fully examined. Estimated blood loss is zero unless otherwise noted in this procedure report.      COLON FINDINGS: Three sessile polyps ranging between 3-24mm in size were found in the ascending colon (2) and transverse colon (1). Polypectomies were performed with a cold snare.  The resection was complete, the polyp tissue was completely retrieved and sent to histology.   A sessile polyp measuring 2-3 mm in size was found in the rectum.  A polypectomy was performed with cold forceps.  The resection was complete, the polyp  tissue was completely retrieved and sent to histology.  Retroflexed views revealed small internal hemorrhoids. The time to cecum = 1.4 Withdrawal time = 11.3   The scope was withdrawn and the procedure completed.  COMPLICATIONS: There were no immediate complications.  ENDOSCOPIC IMPRESSION: 1.   Three sessile polyps ranging between 3-82mm in size were found in the ascending colon and transverse colon; polypectomies were performed with a cold snare 2.   Sessile polyp was found in the rectum; polypectomy was performed with cold forceps  RECOMMENDATIONS: 1.  Await pathology results 2.  Timing of repeat colonoscopy will be determined by pathology findings. 3.  You will receive a letter within 1-2 weeks with the results of your biopsy as well as final recommendations.  Please call my office if you have not received a letter after 3 weeks.  eSigned:  Jerene Bears, MD 01/14/2015 9:54 AM   cc: Arnette Norris MD and The Patient   PATIENT NAME:  Richard Giles, Richard Giles MR#: [id number]

## 2015-01-15 ENCOUNTER — Telehealth: Payer: Self-pay

## 2015-01-15 NOTE — Telephone Encounter (Signed)
  Follow up Call-  Call back number 01/14/2015  Post procedure Call Back phone  # [phone] cell  Permission to leave phone message Yes     Patient questions:  Do you have a fever, pain , or abdominal swelling? No. Pain Score  0 *  Have you tolerated food without any problems? Yes.    Have you been able to return to your normal activities? Yes.    Do you have any questions about your discharge instructions: Diet   No. Medications  No. Follow up visit  No.  Do you have questions or concerns about your Care? No.  Actions: * If pain score is 4 or above: No action needed, pain <4.

## 2015-01-21 ENCOUNTER — Encounter: Payer: Self-pay | Admitting: Internal Medicine

## 2015-03-15 ENCOUNTER — Other Ambulatory Visit: Payer: Self-pay | Admitting: Family Medicine

## 2015-03-15 ENCOUNTER — Encounter: Payer: Self-pay | Admitting: Family Medicine

## 2015-03-15 ENCOUNTER — Other Ambulatory Visit: Payer: Self-pay | Admitting: Internal Medicine

## 2015-03-18 ENCOUNTER — Other Ambulatory Visit: Payer: Self-pay | Admitting: Family Medicine

## 2015-03-18 MED ORDER — BECLOMETHASONE DIPROPIONATE 80 MCG/ACT IN AERS: 1.0000 | INHALATION_SPRAY | Freq: Two times a day (BID) | RESPIRATORY_TRACT | Status: DC

## 2015-04-11 ENCOUNTER — Encounter: Payer: Self-pay | Admitting: Family Medicine

## 2015-04-11 ENCOUNTER — Ambulatory Visit (INDEPENDENT_AMBULATORY_CARE_PROVIDER_SITE_OTHER): Payer: 59 | Admitting: Family Medicine

## 2015-04-11 VITALS — BP 142/80 | HR 91 | Temp 97.1°F | Wt 305.5 lb

## 2015-04-11 DIAGNOSIS — J45991 Cough variant asthma: Secondary | ICD-10-CM

## 2015-04-11 DIAGNOSIS — I10 Essential (primary) hypertension: Secondary | ICD-10-CM | POA: Diagnosis not present

## 2015-04-11 MED ORDER — PREDNISONE 20 MG PO TABS: ORAL_TABLET | ORAL | Status: DC

## 2015-04-11 MED ORDER — AMOXICILLIN-POT CLAVULANATE 875-125 MG PO TABS: 1.0000 | ORAL_TABLET | Freq: Two times a day (BID) | ORAL | Status: DC

## 2015-04-11 NOTE — Progress Notes (Signed)
Subjective:   Patient ID: Richard Giles, male    DOB: Apr 14, 1960, 55 y.o.   MRN: [id number]  Richard Giles is a pleasant 55 y.o. year old male who presents to clinic today with Cough and congestion in chest  on 04/11/2015  HPI:  Over one week of progressive URI symptoms- congestion, cough. Wife had similar symptoms.  Followed by Dr. Melvyn Novas for cough variant asthma.  Last saw him on 07/30/14- note reviewed.  At that time,he prescribed  Augmentin, prednisone, Nexium, Delsym. Also takes Qvar twice daily.  He has been taking Qvar but chest has been so tight last day or two, that he feels he isnt really getting "it in."  Current Outpatient Prescriptions on File Prior to Visit  Medication Sig Dispense Refill  . acetaminophen (TYLENOL) 500 MG tablet Take 1,000 mg by mouth every 6 (six) hours as needed (pain).     Marland Kitchen allopurinol (ZYLOPRIM) 300 MG tablet TAKE 1 TABLET BY MOUTH DAILY 30 tablet 5  . amLODipine (NORVASC) 10 MG tablet Take 1 tablet (10 mg total) by mouth daily. 30 tablet 2  . aspirin 81 MG tablet Take 81 mg by mouth daily.    Marland Kitchen atorvastatin (LIPITOR) 20 MG tablet TAKE 1 TABLET BY MOUTH DAILY 30 tablet 2  . beclomethasone (QVAR) 80 MCG/ACT inhaler Inhale 1 puff into the lungs 2 (two) times daily. 1 Inhaler 3  . dextromethorphan (DELSYM) 30 MG/5ML liquid Take 60 mg by mouth daily as needed for cough.     . famotidine (PEPCID) 20 MG tablet Take 20 mg by mouth at bedtime.     . fexofenadine (ALLEGRA) 180 MG tablet Take 180 mg by mouth daily.     . furosemide (LASIX) 20 MG tablet Take 1 tablet (20 mg total) by mouth daily. 30 tablet 11  . irbesartan (AVAPRO) 150 MG tablet Take 1 tablet (150 mg total) by mouth daily. 30 tablet 2  . pantoprazole (PROTONIX) 40 MG tablet Take 1 tablet (40 mg total) by mouth daily. 30 tablet 2  . Probiotic Product (PROBIOTIC PO) Take 1 tablet by mouth daily.    Marland Kitchen Respiratory Therapy Supplies (FLUTTER) DEVI Use as directed 1 each 0  . traMADol (ULTRAM) 50 MG  tablet 1-2 every 4 hours as needed for cough or pain 40 tablet 0   No current facility-administered medications on file prior to visit.    Allergies  Allergen Reactions  . Bee Venom Anaphylaxis  . Codeine Diarrhea and Nausea And Vomiting  . Lisinopril Cough    Past Medical History  Diagnosis Date  . Allergy     allergy workup with Dr. Caprice Red : Maybe trees Did not receive shots per patient  . Chronic cough onset 2005/ resolved on ICS July 19,2011    Sinus CT negative 07/08/2009 negative  . Chronic bronchitis ([place])   . GERD (gastroesophageal reflux disease)   . Hyperlipidemia   . Restless leg syndrome   . Diabetes mellitus without complication ([place])   . Sleep apnea     does not have to use cpap  . Hypertension   . COPD (chronic obstructive pulmonary disease) ([place])     per pt 01-04-15  . Chronic kidney disease     per pt 01-04-2015    Past Surgical History  Procedure Laterality Date  . Foot surgery  09/03/2003    Left foot osteophye removal (Dr. Marlou Sa)  . Septoplasty  1983    at 55 years of age  .  Bialteral inguinal hernia  1967  . Colonoscopy    . Polypectomy      Family History  Problem Relation Age of Onset  . Hypertension Father   . Colon polyps Father   . Diabetes Maternal Grandmother   . Depression Neg Hx   . Drug abuse Neg Hx   . Stroke Neg Hx   . Colon cancer Neg Hx   . Stomach cancer Neg Hx   . Esophageal cancer Neg Hx   . Rectal cancer Neg Hx     Social History   Social History  . Marital Status: Married    Spouse Name: N/A  . Number of Children: N/A  . Years of Education: N/A   Occupational History  . Not on file.   Social History Main Topics  . Smoking status: Never Smoker   . Smokeless tobacco: Former Systems developer    Quit date: 02/23/1993  . Alcohol Use: No  . Drug Use: No  . Sexual Activity: Not on file   Other Topics Concern  . Not on file   Social History Narrative   The PMH, PSH, Social History, Family History, Medications,  and allergies have been reviewed in [facility], and have been updated if relevant.   Review of Systems  Constitutional: Negative.   HENT: Negative.   Eyes: Negative.   Respiratory: Positive for cough, shortness of breath and wheezing. Negative for stridor.   Cardiovascular: Negative.   Gastrointestinal: Negative.   Endocrine: Negative.   Genitourinary: Negative.   Musculoskeletal: Negative.   Skin: Negative.   Allergic/Immunologic: Negative.   Neurological: Negative.   Hematological: Negative.   Psychiatric/Behavioral: Negative.   All other systems reviewed and are negative.      Objective:    BP 142/80 mmHg  Pulse 91  Temp(Src) 97.1 F (36.2 C) (Oral)  Wt 305 lb 8 oz (138.574 kg)  SpO2 98%   Physical Exam  Constitutional: He is oriented to person, place, and time. He appears well-developed and well-nourished. No distress.  HENT:  Head: Normocephalic.  Eyes: Conjunctivae are normal.  Neck: Normal range of motion.  Pulmonary/Chest: Effort normal. No respiratory distress. He has wheezes. He has no rales. He exhibits no tenderness.  Decreased BS bilaterally  Abdominal: Soft.  Neurological: He is alert and oriented to person, place, and time. No cranial nerve deficit.  Skin: Skin is warm and dry. No erythema.  Psychiatric: He has a normal mood and affect. His behavior is normal. Judgment and thought content normal.  Nursing note and vitals reviewed.         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

## 2015-04-11 NOTE — Assessment & Plan Note (Signed)
Deteriorated. Lungs tight- treat with abx and course of prednisone. Continue Qvar. Call or return to clinic prn if these symptoms worsen or fail to improve as anticipated. The patient indicates understanding of these issues and agrees with the plan.

## 2015-04-11 NOTE — Progress Notes (Signed)
Pre visit review using our clinic review tool, if applicable. No additional management support is needed unless otherwise documented below in the visit note. 

## 2015-04-17 ENCOUNTER — Other Ambulatory Visit: Payer: Self-pay | Admitting: Internal Medicine

## 2015-07-19 ENCOUNTER — Other Ambulatory Visit: Payer: Self-pay | Admitting: Family Medicine

## 2015-08-19 ENCOUNTER — Other Ambulatory Visit: Payer: Self-pay | Admitting: Internal Medicine

## 2015-08-21 ENCOUNTER — Other Ambulatory Visit: Payer: Self-pay

## 2015-08-21 ENCOUNTER — Other Ambulatory Visit: Payer: Self-pay | Admitting: Family Medicine

## 2015-08-21 NOTE — Telephone Encounter (Signed)
V/M left requesting refill for furosemide to [facility]; med refill had been denied due to Dr Melvyn Novas prescribing med. Pt thought when he started seeing Dr Deborra Medina that she would take over refilling meds due to pt not seeing Dr Melvyn Novas very often. Pt request cb. Pt last seen f/u 08/20/14 and acute visit 04/11/15; no future appt scheduled.

## 2015-08-22 MED ORDER — FUROSEMIDE 20 MG PO TABS: 20.0000 mg | ORAL_TABLET | Freq: Every day | ORAL | Status: DC

## 2015-09-16 ENCOUNTER — Other Ambulatory Visit: Payer: Self-pay | Admitting: Family Medicine

## 2015-10-18 ENCOUNTER — Other Ambulatory Visit: Payer: Self-pay | Admitting: Family Medicine

## 2015-11-13 ENCOUNTER — Other Ambulatory Visit: Payer: Self-pay | Admitting: Family Medicine

## 2015-11-13 ENCOUNTER — Encounter: Payer: Self-pay | Admitting: Family Medicine

## 2015-11-13 MED ORDER — BECLOMETHASONE DIPROPIONATE 80 MCG/ACT IN AERS: 1.0000 | INHALATION_SPRAY | Freq: Two times a day (BID) | RESPIRATORY_TRACT | 3 refills | Status: DC

## 2015-11-19 ENCOUNTER — Other Ambulatory Visit: Payer: Self-pay | Admitting: Family Medicine

## 2015-11-22 NOTE — Telephone Encounter (Signed)
Pt has not been seen for gout in 3+years. pls advise

## 2015-11-22 NOTE — Telephone Encounter (Signed)
Noted.  Ok to refill and to follow up within the next few months.  I think his gout has been stable and his labs are normal and UTD.

## 2015-12-05 ENCOUNTER — Other Ambulatory Visit: Payer: Self-pay | Admitting: Family Medicine

## 2015-12-05 DIAGNOSIS — I10 Essential (primary) hypertension: Secondary | ICD-10-CM

## 2015-12-05 DIAGNOSIS — E119 Type 2 diabetes mellitus without complications: Secondary | ICD-10-CM

## 2015-12-05 DIAGNOSIS — Z0000 Encounter for general adult medical examination without abnormal findings: Secondary | ICD-10-CM

## 2015-12-12 ENCOUNTER — Encounter: Payer: 59 | Admitting: Family Medicine

## 2015-12-13 ENCOUNTER — Other Ambulatory Visit (INDEPENDENT_AMBULATORY_CARE_PROVIDER_SITE_OTHER): Payer: 59

## 2015-12-13 DIAGNOSIS — Z0000 Encounter for general adult medical examination without abnormal findings: Secondary | ICD-10-CM

## 2015-12-13 DIAGNOSIS — E119 Type 2 diabetes mellitus without complications: Secondary | ICD-10-CM | POA: Diagnosis not present

## 2015-12-13 LAB — HEMOGLOBIN A1C: Hgb A1c MFr Bld: 6.4 % (ref 4.6–6.5)

## 2015-12-13 LAB — CBC WITH DIFFERENTIAL/PLATELET
Basophils Absolute: 0 10*3/uL (ref 0.0–0.1)
Basophils Relative: 0.3 % (ref 0.0–3.0)
Eosinophils Absolute: 0.2 10*3/uL (ref 0.0–0.7)
Eosinophils Relative: 2.2 % (ref 0.0–5.0)
HCT: 39.2 % (ref 39.0–52.0)
Hemoglobin: 13.5 g/dL (ref 13.0–17.0)
Lymphocytes Relative: 15.8 % (ref 12.0–46.0)
Lymphs Abs: 1.7 10*3/uL (ref 0.7–4.0)
MCHC: 34.4 g/dL (ref 30.0–36.0)
MCV: 90.6 fl (ref 78.0–100.0)
Monocytes Absolute: 0.6 10*3/uL (ref 0.1–1.0)
Monocytes Relative: 5.5 % (ref 3.0–12.0)
Neutro Abs: 8.3 10*3/uL — ABNORMAL HIGH (ref 1.4–7.7)
Neutrophils Relative %: 76.2 % (ref 43.0–77.0)
Platelets: 244 10*3/uL (ref 150.0–400.0)
RBC: 4.33 Mil/uL (ref 4.22–5.81)
RDW: 13.9 % (ref 11.5–15.5)
WBC: 10.9 10*3/uL — ABNORMAL HIGH (ref 4.0–10.5)

## 2015-12-13 LAB — COMPREHENSIVE METABOLIC PANEL
ALT: 21 U/L (ref 0–53)
AST: 18 U/L (ref 0–37)
Albumin: 4.1 g/dL (ref 3.5–5.2)
Alkaline Phosphatase: 106 U/L (ref 39–117)
BUN: 38 mg/dL — ABNORMAL HIGH (ref 6–23)
CO2: 29 mEq/L (ref 19–32)
Calcium: 9.8 mg/dL (ref 8.4–10.5)
Chloride: 102 mEq/L (ref 96–112)
Creatinine, Ser: 1.94 mg/dL — ABNORMAL HIGH (ref 0.40–1.50)
GFR: 38.39 mL/min — ABNORMAL LOW (ref >60.00)
Glucose, Bld: 132 mg/dL — ABNORMAL HIGH (ref 70–99)
Potassium: 5.2 mEq/L — ABNORMAL HIGH (ref 3.5–5.1)
Sodium: 138 mEq/L (ref 135–145)
Total Bilirubin: 0.5 mg/dL (ref 0.2–1.2)
Total Protein: 6.8 g/dL (ref 6.0–8.3)

## 2015-12-13 LAB — LIPID PANEL
Cholesterol: 167 mg/dL (ref 0–200)
HDL: 39.4 mg/dL (ref >39.00)
NonHDL: 127.4
Total CHOL/HDL Ratio: 4
Triglycerides: 215 mg/dL — ABNORMAL HIGH (ref 0.0–149.0)
VLDL: 43 mg/dL — ABNORMAL HIGH (ref 0.0–40.0)

## 2015-12-13 LAB — LDL CHOLESTEROL, DIRECT: Direct LDL: 90 mg/dL

## 2015-12-13 LAB — PSA: PSA: 0.63 ng/mL (ref 0.10–4.00)

## 2015-12-14 ENCOUNTER — Other Ambulatory Visit: Payer: Self-pay | Admitting: Family Medicine

## 2015-12-17 ENCOUNTER — Encounter: Payer: Self-pay | Admitting: Family Medicine

## 2015-12-17 ENCOUNTER — Ambulatory Visit (INDEPENDENT_AMBULATORY_CARE_PROVIDER_SITE_OTHER): Payer: 59 | Admitting: Family Medicine

## 2015-12-17 VITALS — BP 142/72 | HR 93 | Temp 97.4°F | Ht 70.75 in | Wt 306.0 lb

## 2015-12-17 DIAGNOSIS — N289 Disorder of kidney and ureter, unspecified: Secondary | ICD-10-CM

## 2015-12-17 DIAGNOSIS — N028 Recurrent and persistent hematuria with other morphologic changes: Secondary | ICD-10-CM | POA: Diagnosis not present

## 2015-12-17 DIAGNOSIS — E66812 Obesity, class 2: Secondary | ICD-10-CM | POA: Insufficient documentation

## 2015-12-17 DIAGNOSIS — J45991 Cough variant asthma: Secondary | ICD-10-CM | POA: Diagnosis not present

## 2015-12-17 DIAGNOSIS — E119 Type 2 diabetes mellitus without complications: Secondary | ICD-10-CM

## 2015-12-17 DIAGNOSIS — E785 Hyperlipidemia, unspecified: Secondary | ICD-10-CM

## 2015-12-17 DIAGNOSIS — I10 Essential (primary) hypertension: Secondary | ICD-10-CM | POA: Diagnosis not present

## 2015-12-17 DIAGNOSIS — G2581 Restless legs syndrome: Secondary | ICD-10-CM | POA: Insufficient documentation

## 2015-12-17 DIAGNOSIS — Z0000 Encounter for general adult medical examination without abnormal findings: Secondary | ICD-10-CM

## 2015-12-17 MED ORDER — CYCLOBENZAPRINE HCL 5 MG PO TABS: 5.0000 mg | ORAL_TABLET | Freq: Every day | ORAL | 1 refills | Status: DC

## 2015-12-17 MED ORDER — PREDNISONE 20 MG PO TABS: ORAL_TABLET | ORAL | 0 refills | Status: DC

## 2015-12-17 NOTE — Progress Notes (Signed)
Pre visit review using our clinic review tool, if applicable. No additional management support is needed unless otherwise documented below in the visit note. 

## 2015-12-17 NOTE — Assessment & Plan Note (Signed)
Deteriorated. Discussed importance of weight loss and how obesity impacts his other health problems.  He declines referral to nutritionist.  Would like to try Whole 30 diet again as this was recently very effective for him.

## 2015-12-17 NOTE — Assessment & Plan Note (Signed)
Deteriorated likely due to inactivity and weight gain. eRx for flexeril sent.

## 2015-12-17 NOTE — Progress Notes (Signed)
Subjective:    Patient ID: Richard Giles, male    DOB: Dec 29, 1960, 55 y.o.   MRN: [id number]  HPI  Very pleasant 55 yo male here for CPX. Colonoscopy 01/14/15  HTN-  BP had been well controlled on Lisinopril but stopped by pulmonary and placed on Avapro (Dr. Melvyn Novas).  And amlodipine 10 mg daily .  Since starting amlodipine, he has been having more LE.  Has not tried compression hose.  Does take lasix daily which does help.     Denies anyCP or SOB (more than baseline).  DM- has been diet controlled since he lost weight although has gained some back and a1c has deteriorated.   Lab Results  Component Value Date   HGBA1C 6.4 12/13/2015    Wt Readings from Last 3 Encounters:  12/17/15 (!) 306 lb (138.8 kg)  04/11/15 (!) 305 lb 8 oz (138.6 kg)  01/14/15 (!) 304 lb (137.9 kg)   Renal insufficiency- recently diagnosed with IgA nephropathy- biopsy proven.  Followed by [facility].    Lab Results  Component Value Date   CREATININE 1.94 (H) 12/13/2015    Also now having more restless leg symptoms since he has gained weight.  Has tried several RLS rxs in past.  Only flexeril has been effective.  Asking for a refill of flexeril today.  HLD- on lipitor 20 mg daily. Lab Results  Component Value Date   CHOL 167 12/13/2015   HDL 39.40 12/13/2015   LDLCALC 178 (H) 07/03/2013   LDLDIRECT 90.0 12/13/2015   TRIG 215.0 (H) 12/13/2015   CHOLHDL 4 12/13/2015   Lab Results  Component Value Date   PSA 0.63 12/13/2015   PSA 0.70 07/11/2014   PSA 0.71 07/03/2013   Lab Results  Component Value Date   WBC 10.9 (H) 12/13/2015   HGB 13.5 12/13/2015   HCT 39.2 12/13/2015   MCV 90.6 12/13/2015   PLT 244.0 12/13/2015   Lab Results  Component Value Date   ALT 21 12/13/2015   AST 18 12/13/2015   ALKPHOS 106 12/13/2015   BILITOT 0.5 12/13/2015   Current Outpatient Prescriptions on File Prior to Visit  Medication Sig Dispense Refill  . acetaminophen (TYLENOL) 500 MG tablet Take 1,000 mg by mouth  every 6 (six) hours as needed (pain).     Marland Kitchen allopurinol (ZYLOPRIM) 300 MG tablet TAKE 1 TABLET BY MOUTH ONCE A DAY *NEED OFFICE VISIT 30 tablet 1  . amLODipine (NORVASC) 10 MG tablet Take 1 tablet (10 mg total) by mouth daily. 30 tablet 2  . aspirin 81 MG tablet Take 81 mg by mouth daily.    Marland Kitchen atorvastatin (LIPITOR) 20 MG tablet TAKE 1 TABLET BY MOUTH DAILY *MUST SCHEDULE ANNUAL EXAM FOR JUNE 2017* 30 tablet 3  . beclomethasone (QVAR) 80 MCG/ACT inhaler Inhale 1 puff into the lungs 2 (two) times daily. 1 Inhaler 3  . dextromethorphan (DELSYM) 30 MG/5ML liquid Take 60 mg by mouth daily as needed for cough.     . famotidine (PEPCID) 20 MG tablet Take 20 mg by mouth at bedtime.     . fexofenadine (ALLEGRA) 180 MG tablet Take 180 mg by mouth daily.     . furosemide (LASIX) 20 MG tablet TAKE 1 TABLET BY MOUTH DAILY 30 tablet 1  . irbesartan (AVAPRO) 150 MG tablet Take 1 tablet (150 mg total) by mouth daily. 30 tablet 2  . pantoprazole (PROTONIX) 40 MG tablet Take 1 tablet (40 mg total) by mouth daily. 30 tablet 2  .  Probiotic Product (PROBIOTIC PO) Take 1 tablet by mouth daily.    Marland Kitchen Respiratory Therapy Supplies (FLUTTER) DEVI Use as directed 1 each 0  . traMADol (ULTRAM) 50 MG tablet 1-2 every 4 hours as needed for cough or pain 40 tablet 0   No current facility-administered medications on file prior to visit.     Allergies  Allergen Reactions  . Bee Venom Anaphylaxis  . Codeine Diarrhea and Nausea And Vomiting  . Lisinopril Cough    Past Medical History:  Diagnosis Date  . Allergy    allergy workup with Dr. Caprice Red : Maybe trees Did not receive shots per patient  . Chronic bronchitis ([place])   . Chronic cough onset 2005/ resolved on ICS July 19,2011   Sinus CT negative 07/08/2009 negative  . Chronic kidney disease    per pt 01-04-2015  . COPD (chronic obstructive pulmonary disease) ([place])    per pt 01-04-15  . Diabetes mellitus without complication ([place])   . GERD  (gastroesophageal reflux disease)   . Hyperlipidemia   . Hypertension   . Restless leg syndrome   . Sleep apnea    does not have to use cpap    Past Surgical History:  Procedure Laterality Date  . bialteral inguinal hernia  1967  . COLONOSCOPY    . FOOT SURGERY  09/03/2003   Left foot osteophye removal (Dr. Marlou Sa)  . POLYPECTOMY    . SEPTOPLASTY  1983   at 55 years of age    Family History  Problem Relation Age of Onset  . Hypertension Father   . Colon polyps Father   . Diabetes Maternal Grandmother   . Depression Neg Hx   . Drug abuse Neg Hx   . Stroke Neg Hx   . Colon cancer Neg Hx   . Stomach cancer Neg Hx   . Esophageal cancer Neg Hx   . Rectal cancer Neg Hx     Social History   Social History  . Marital status: Married    Spouse name: N/A  . Number of children: N/A  . Years of education: N/A   Occupational History  . Not on file.   Social History Main Topics  . Smoking status: Never Smoker  . Smokeless tobacco: Former Systems developer    Quit date: 02/23/1993  . Alcohol use No  . Drug use: No  . Sexual activity: Not on file   Other Topics Concern  . Not on file   Social History Narrative  . No narrative on file   The PMH, PSH, Social History, Family History, Medications, and allergies have been reviewed in [facility], and have been updated if relevant.   Review of Systems  Constitutional: Positive for fatigue.  HENT: Negative.   Eyes: Negative.   Respiratory: Positive for cough and shortness of breath.   Cardiovascular: Negative.   Gastrointestinal: Negative.   Endocrine: Negative.   Genitourinary: Negative.  Negative for difficulty urinating.  Musculoskeletal: Negative.   Skin: Negative.   Allergic/Immunologic: Positive for environmental allergies.  Neurological: Negative.   Hematological: Negative.   Psychiatric/Behavioral: Negative.   All other systems reviewed and are negative.       Past Medical History:  Diagnosis Date  . Allergy    allergy  workup with Dr. Caprice Red : Maybe trees Did not receive shots per patient  . Chronic bronchitis ([place])   . Chronic cough onset 2005/ resolved on ICS July 19,2011   Sinus CT negative 07/08/2009 negative  . Chronic  kidney disease    per pt 01-04-2015  . COPD (chronic obstructive pulmonary disease) ([place])    per pt 01-04-15  . Diabetes mellitus without complication ([place])   . GERD (gastroesophageal reflux disease)   . Hyperlipidemia   . Hypertension   . Restless leg syndrome   . Sleep apnea    does not have to use cpap    Current Outpatient Prescriptions  Medication Sig Dispense Refill  . acetaminophen (TYLENOL) 500 MG tablet Take 1,000 mg by mouth every 6 (six) hours as needed (pain).     Marland Kitchen allopurinol (ZYLOPRIM) 300 MG tablet TAKE 1 TABLET BY MOUTH ONCE A DAY *NEED OFFICE VISIT 30 tablet 1  . amLODipine (NORVASC) 10 MG tablet Take 1 tablet (10 mg total) by mouth daily. 30 tablet 2  . aspirin 81 MG tablet Take 81 mg by mouth daily.    Marland Kitchen atorvastatin (LIPITOR) 20 MG tablet TAKE 1 TABLET BY MOUTH DAILY *MUST SCHEDULE ANNUAL EXAM FOR JUNE 2017* 30 tablet 3  . beclomethasone (QVAR) 80 MCG/ACT inhaler Inhale 1 puff into the lungs 2 (two) times daily. 1 Inhaler 3  . dextromethorphan (DELSYM) 30 MG/5ML liquid Take 60 mg by mouth daily as needed for cough.     . famotidine (PEPCID) 20 MG tablet Take 20 mg by mouth at bedtime.     . fexofenadine (ALLEGRA) 180 MG tablet Take 180 mg by mouth daily.     . furosemide (LASIX) 20 MG tablet TAKE 1 TABLET BY MOUTH DAILY 30 tablet 1  . irbesartan (AVAPRO) 150 MG tablet Take 1 tablet (150 mg total) by mouth daily. 30 tablet 2  . pantoprazole (PROTONIX) 40 MG tablet Take 1 tablet (40 mg total) by mouth daily. 30 tablet 2  . Probiotic Product (PROBIOTIC PO) Take 1 tablet by mouth daily.    Marland Kitchen Respiratory Therapy Supplies (FLUTTER) DEVI Use as directed 1 each 0  . traMADol (ULTRAM) 50 MG tablet 1-2 every 4 hours as needed for cough or pain 40 tablet 0    No current facility-administered medications for this visit.     Allergies  Allergen Reactions  . Bee Venom Anaphylaxis  . Codeine Diarrhea and Nausea And Vomiting  . Lisinopril Cough    Family History  Problem Relation Age of Onset  . Hypertension Father   . Colon polyps Father   . Diabetes Maternal Grandmother   . Depression Neg Hx   . Drug abuse Neg Hx   . Stroke Neg Hx   . Colon cancer Neg Hx   . Stomach cancer Neg Hx   . Esophageal cancer Neg Hx   . Rectal cancer Neg Hx     Social History   Social History  . Marital status: Married    Spouse name: N/A  . Number of children: N/A  . Years of education: N/A   Occupational History  . Not on file.   Social History Main Topics  . Smoking status: Never Smoker  . Smokeless tobacco: Former Systems developer    Quit date: 02/23/1993  . Alcohol use No  . Drug use: No  . Sexual activity: Not on file   Other Topics Concern  . Not on file   Social History Narrative  . No narrative on file       Objective:   Physical Exam   BP (!) 142/72   Pulse 93   Temp 97.4 F (36.3 C) (Oral)   Ht 5' 10.75" (1.797 m)   Wt Marland Kitchen)  306 lb (138.8 kg)   SpO2 96%   BMI 42.98 kg/m  Wt Readings from Last 3 Encounters:  12/17/15 (!) 306 lb (138.8 kg)  04/11/15 (!) 305 lb 8 oz (138.6 kg)  01/14/15 (!) 304 lb (137.9 kg)   General:  overweght male in NAD Eyes:  PERRL Ears:  External ear exam shows no significant lesions or deformities.  Otoscopic examination reveals clear canals, tympanic membranes are intact bilaterally without bulging, retraction, inflammation or discharge. Hearing is grossly normal bilaterally. Nose:  External nasal examination shows no deformity or inflammation. Nasal mucosa are pink and moist without lesions or exudates. Mouth:  Oral mucosa and oropharynx without lesions or exudates.  Teeth in good repair. Neck:  no carotid bruit or thyromegaly no cervical or supraclavicular lymphadenopathy  Lungs:  Normal respiratory  effort, chest expands symmetrically. Lungs are clear to auscultation, no crackles or wheezes. Heart:  Normal rate and regular rhythm. S1 and S2 normal without gallop, murmur, click, rub or other extra sounds. Abdomen:  Bowel sounds positive,abdomen soft and non-tender without masses, organomegaly or hernias noted. Pulses:  R and L posterior tibial pulses are full and equal bilaterally  Extremities:  + 1 LE edema bilaterally     Assessment & Plan:

## 2015-12-17 NOTE — Assessment & Plan Note (Signed)
Followed by renal at [facility]

## 2015-12-17 NOTE — Assessment & Plan Note (Signed)
Reviewed preventive care protocols, scheduled due services, and updated immunizations Discussed nutrition, exercise, diet, and healthy lifestyle.  

## 2015-12-17 NOTE — Patient Instructions (Signed)
Great to see you.  Let's restart whole30.  Keep me updated.  You can do this!

## 2015-12-17 NOTE — Assessment & Plan Note (Signed)
Reasonable control- difficult to balance with kidney function. No changes made today.

## 2015-12-17 NOTE — Assessment & Plan Note (Signed)
Followed by pulmonary 

## 2015-12-17 NOTE — Assessment & Plan Note (Signed)
Continue current rx.  No changes made today. 

## 2015-12-17 NOTE — Assessment & Plan Note (Signed)
Deteriorated. Agreed not to restart rx at this time- he is motivated to reverse with diet.

## 2015-12-18 ENCOUNTER — Other Ambulatory Visit: Payer: Self-pay | Admitting: Family Medicine

## 2015-12-18 MED ORDER — FLUOXETINE HCL 20 MG PO TABS: 20.0000 mg | ORAL_TABLET | Freq: Every day | ORAL | 3 refills | Status: DC

## 2015-12-18 NOTE — Telephone Encounter (Signed)
Will need to be seen again, since he does not have an existing Dx of anxiety?

## 2016-01-11 ENCOUNTER — Other Ambulatory Visit: Payer: Self-pay | Admitting: Family Medicine

## 2016-01-24 ENCOUNTER — Telehealth: Payer: Self-pay | Admitting: Family Medicine

## 2016-01-24 NOTE — Telephone Encounter (Signed)
[facility] Patient Name: Richard Giles DOB: 1960/08/07 Initial Comment Caller states he has COPD, doing good. Couple of months ago he had a cold, with a prescription of prednizone on stand by. He followed it to the T, feeling better, but now he is out of the medication. Cold symptoms are now returning, and he is wondering what else to do for this issue. Last time he had to do a second round of prednizone with a z-pak to get rid of it. Nurse Assessment Nurse: Dimas Chyle, RN, Dellis Filbert Date/Time Eilene Ghazi Time): 01/24/2016 11:09:55 AM Confirm and document reason for call. If symptomatic, describe symptoms. ---Caller states he has COPD, doing good. Couple of months ago he had a cold, with a prescription of prednisone on stand by. He followed it to the T, feeling better, but now he is out of the medication. Cold symptoms are now returning, and he is wondering what else to do for this issue. Last time he had to do a second round of prednisone with a z-pak to get rid of it. Has had cold symptoms for 2 weeks. No fever. Does the patient have any new or worsening symptoms? ---Yes Will a triage be completed? ---Yes Related visit to physician within the last 2 weeks? ---No Does the PT have any chronic conditions? (i.e. diabetes, asthma, etc.) ---Yes List chronic conditions. ---COPD, HTN Is this a behavioral health or substance abuse call? ---No Guidelines Guideline Title Affirmed Question Affirmed Notes Cough - Acute Productive [1] Known COPD or other severe lung disease (i.e., bronchiectasis, cystic fibrosis, lung surgery) AND [2] worsening symptoms (i.e., increased sputum purulence or amount, increased breathing difficulty Final Disposition User See Physician within 24 Hours [place], RN, Dellis Filbert Comments No appointments available. Caller was wanting to see if PCP would call in additional dose of  prednisone and z-pak. Referrals REFERRED TO PCP OFFICE Disagree/Comply: Comply

## 2016-01-24 NOTE — Progress Notes (Signed)
Chief Complaint  Patient presents with  . Cough    HPI: Richard Giles 55 y.o.  [facility]  Called pcp  office and no visit available   Says has hx copd and now has a cough   Self rx with prednisone .  At home   And just got off  Prednisone  q var once a dya now bid   Has been off pred wean  For days and now cough back   Not using albuteroli nbhaler  No fever cp  some wheezing    Onset of this with a cold  Cough is mostly dry  Wheezy feeling   Remote hx of  Dr wert consultation   .   See [facility]  [employer]ications.   Caller states he has COPD, doing good. Couple of months ago he had a cold, with a prescription of prednisone on stand by. He followed it to the T, feeling better, but now he is out of the medication. Cold symptoms are now returning, and he is wondering what else to do for this issue. Last time he had to do a second round of prednisone with a z-pak to get rid of it. Has had cold symptoms for 2 weeks. No fever.   ROS: See pertinent positives and negatives per HPI. No fever  Infected phelm hemoptysis  Triggers  Temperature change   Past Medical History:  Diagnosis Date  . Allergy    allergy workup with Dr. Caprice Red : Maybe trees Did not receive shots per patient  . Chronic bronchitis ([place])   . Chronic cough onset 2005/ resolved on ICS July 19,2011   Sinus CT negative 07/08/2009 negative  . Chronic kidney disease    per pt 01-04-2015  . COPD (chronic obstructive pulmonary disease) ([place])    per pt 01-04-15  . Diabetes mellitus without complication ([place])   . GERD (gastroesophageal reflux disease)   . Hyperlipidemia   . Hypertension   . Restless leg syndrome   . Sleep apnea    does not have to use cpap    Family History  Problem Relation Age of Onset  . Hypertension Father   . Colon polyps Father   . Diabetes Maternal Grandmother   . Depression Neg Hx   . Drug abuse Neg Hx   . Stroke Neg Hx   . Colon cancer Neg Hx   . Stomach  cancer Neg Hx   . Esophageal cancer Neg Hx   . Rectal cancer Neg Hx     Social History   Social History  . Marital status: Married    Spouse name: N/A  . Number of children: N/A  . Years of education: N/A   Social History Main Topics  . Smoking status: Never Smoker  . Smokeless tobacco: Former Systems developer    Quit date: 02/23/1993  . Alcohol use No  . Drug use: No  . Sexual activity: Not Asked   Other Topics Concern  . None   Social History Narrative  . None    Outpatient Medications Prior to Visit  Medication Sig Dispense Refill  . acetaminophen (TYLENOL) 500 MG tablet Take 1,000 mg by mouth every 6 (six) hours as needed (pain).     Marland Kitchen allopurinol (ZYLOPRIM) 300 MG tablet TAKE 1 TABLET BY MOUTH ONCE A DAY *NEED OFFICE VISIT 30 tablet 1  . amLODipine (NORVASC) 10 MG tablet Take 1 tablet (10 mg total) by mouth daily. 30 tablet 2  .  aspirin 81 MG tablet Take 81 mg by mouth daily.    Marland Kitchen atorvastatin (LIPITOR) 20 MG tablet TAKE 1 TABLET BY MOUTH DAILY 90 tablet 1  . beclomethasone (QVAR) 80 MCG/ACT inhaler Inhale 1 puff into the lungs 2 (two) times daily. 1 Inhaler 3  . cyclobenzaprine (FLEXERIL) 5 MG tablet Take 1 tablet (5 mg total) by mouth at bedtime. 30 tablet 1  . dextromethorphan (DELSYM) 30 MG/5ML liquid Take 60 mg by mouth daily as needed for cough.     . famotidine (PEPCID) 20 MG tablet Take 20 mg by mouth at bedtime.     . fexofenadine (ALLEGRA) 180 MG tablet Take 180 mg by mouth daily.     Marland Kitchen FLUoxetine (PROZAC) 20 MG tablet Take 1 tablet (20 mg total) by mouth daily. 30 tablet 3  . furosemide (LASIX) 20 MG tablet TAKE 1 TABLET BY MOUTH DAILY 30 tablet 1  . irbesartan (AVAPRO) 150 MG tablet Take 1 tablet (150 mg total) by mouth daily. 30 tablet 2  . pantoprazole (PROTONIX) 40 MG tablet Take 1 tablet (40 mg total) by mouth daily. 30 tablet 2  . Probiotic Product (PROBIOTIC PO) Take 1 tablet by mouth daily.    Marland Kitchen Respiratory Therapy Supplies (FLUTTER) DEVI Use as directed 1 each  0  . traMADol (ULTRAM) 50 MG tablet 1-2 every 4 hours as needed for cough or pain 40 tablet 0  . predniSONE (DELTASONE) 20 MG tablet 3 a day for 3 days, then 2 a day for 3 days, then 1 a day for 3 days, then 0.5 a day for 4 days. With food. 20 tablet 0   No facility-administered medications prior to visit.      EXAM:  BP (!) 142/78 (BP Location: Right Arm, Patient Position: Sitting, Cuff Size: Normal)   Pulse 94   Temp 98.2 F (36.8 C) (Oral)   Resp 16   Wt (!) 310 lb 4 oz (140.7 kg)   SpO2 98%   BMI 43.58 kg/m   Body mass index is 43.58 kg/m.  GENERAL: vitals reviewed and listed above, alert, oriented, appears well hydrated and in no acute distress no major cough minor at visit   HEENT: atraumatic, conjunctiva  clear, no obvious abnormalities on inspection of external nose and ears tmx clear  OP : no lesion edema or exudate  Min congestion NECK: no obvious masses on inspection palpation  LUNGS: clear to auscultation bilaterally, no wheezes, rales or rhonchi, good air movement CV: HRRR, no clubbing cyanosis or  peripheral edema nl cap refill  MS: moves all extremities without noticeable focal  abnormality PSYCH: pleasant and cooperative, no obvious depression or anxiety Last x ray ok 2016   ASSESSMENT AND PLAN:  Discussed the following assessment and plan:  Cough variant asthma  Exacerbation of asthma, unspecified asthma severity, unspecified whether persistent Seen in past fby dr wert and allergist  Cough variant [place]ham    dont see  Copd evalutaion  But has had recurrent cough in past and   Inhalers   Recurrent self steroid use     With relapsing sx  dsic controller meds  up step conteroller  rx breo coupon  For now  And 3-5 days pred  And OV with pcp  considers other  intervnetions  to control .  Do not see any evidence of bacterial or infection at this time   consideration if ever used  Singular with success as he has known allergies .  Total visit 31mins >  50% spent  counseling and coordinating care as indicated in above note and in instructions to patient .  -Patient advised to return or notify health care team  if symptoms worsen ,persist or new concerns arise.  Patient Instructions  For the next 3-4 weeks   Change the maintenance inhaler to combination  Steroid and bronchodilator  Airways are twitching after a cold for up to a month .   Then you may be able to go back down to the q var.   consideration of Singulair if allergy cause .   Try you albuterol rescue at night  Until better . Make appt with PCP  For 1-2 weeks for fu management       Sarahmarie Leavey K. Valari Taylor M.D.

## 2016-01-24 NOTE — Telephone Encounter (Signed)
Called pt, scheduled at Sat clinic with Dr. Regis Bill / lt

## 2016-01-24 NOTE — Telephone Encounter (Signed)
PCP not in office. He needs an appt. Anything available for Saturday [facility]?

## 2016-01-25 ENCOUNTER — Ambulatory Visit (INDEPENDENT_AMBULATORY_CARE_PROVIDER_SITE_OTHER): Payer: 59 | Admitting: Internal Medicine

## 2016-01-25 ENCOUNTER — Encounter: Payer: Self-pay | Admitting: Internal Medicine

## 2016-01-25 VITALS — BP 142/78 | HR 94 | Temp 98.2°F | Resp 16 | Wt 310.2 lb

## 2016-01-25 DIAGNOSIS — J45991 Cough variant asthma: Secondary | ICD-10-CM

## 2016-01-25 DIAGNOSIS — J45901 Unspecified asthma with (acute) exacerbation: Secondary | ICD-10-CM | POA: Diagnosis not present

## 2016-01-25 MED ORDER — ALBUTEROL SULFATE HFA 108 (90 BASE) MCG/ACT IN AERS: 2.0000 | INHALATION_SPRAY | Freq: Four times a day (QID) | RESPIRATORY_TRACT | 0 refills | Status: DC | PRN

## 2016-01-25 MED ORDER — PREDNISONE 20 MG PO TABS: ORAL_TABLET | ORAL | 0 refills | Status: DC

## 2016-01-25 MED ORDER — FLUTICASONE FUROATE-VILANTEROL 200-25 MCG/INH IN AEPB: 1.0000 | INHALATION_SPRAY | Freq: Every day | RESPIRATORY_TRACT | 0 refills | Status: DC

## 2016-01-25 NOTE — Progress Notes (Signed)
Pre visit review using our clinic review tool, if applicable. No additional management support is needed unless otherwise documented below in the visit note. 

## 2016-01-25 NOTE — Patient Instructions (Addendum)
For the next 3-4 weeks   Change the maintenance inhaler to combination  Steroid and bronchodilator  Airways are twitching after a cold for up to a month .   Then you may be able to go back down to the q var.   consideration of Singulair if allergy cause .   Try you albuterol rescue at night  Until better . Make appt with PCP  For 1-2 weeks for fu management

## 2016-02-09 ENCOUNTER — Other Ambulatory Visit: Payer: Self-pay | Admitting: Family Medicine

## 2016-02-13 ENCOUNTER — Other Ambulatory Visit: Payer: Self-pay | Admitting: Family Medicine

## 2016-02-13 ENCOUNTER — Telehealth: Payer: Self-pay

## 2016-02-13 NOTE — Telephone Encounter (Signed)
Yes I would prefer he take only one tablet daily.

## 2016-02-13 NOTE — Telephone Encounter (Signed)
I am so sorry it is furosemide 20 mg.

## 2016-02-13 NOTE — Telephone Encounter (Signed)
Which rx are you referring to?

## 2016-02-13 NOTE — Telephone Encounter (Signed)
[place] at [facility] left v/m; pt advised [facility] that he was supposed to take 2 tabs a day instead of one tab a day. Per med list instructions are one tab daily.Please advise. Last annual 12/17/15, do not see change in instructions.Please advise.

## 2016-02-14 NOTE — Telephone Encounter (Signed)
Spoke to [place] at [place] and advised. States she will contact pt and confirm Dr Hulen Shouts dosing instruction

## 2016-03-09 ENCOUNTER — Other Ambulatory Visit: Payer: Self-pay | Admitting: Family Medicine

## 2016-04-08 ENCOUNTER — Other Ambulatory Visit: Payer: Self-pay | Admitting: *Deleted

## 2016-04-08 DIAGNOSIS — I10 Essential (primary) hypertension: Secondary | ICD-10-CM

## 2016-04-08 MED ORDER — AMLODIPINE BESYLATE 10 MG PO TABS: 10.0000 mg | ORAL_TABLET | Freq: Every day | ORAL | 2 refills | Status: DC

## 2016-04-08 NOTE — Telephone Encounter (Signed)
Med list indicates pt has not taken since 2016, but had CPE 11/2015. pls advise

## 2016-04-08 NOTE — Telephone Encounter (Signed)
Please confirm if patient is taking this.  If so, ok to refill and update our med list accordingly.

## 2016-04-13 ENCOUNTER — Other Ambulatory Visit: Payer: Self-pay | Admitting: Family Medicine

## 2016-05-21 ENCOUNTER — Other Ambulatory Visit: Payer: Self-pay | Admitting: Internal Medicine

## 2016-05-21 ENCOUNTER — Encounter: Payer: Self-pay | Admitting: Family Medicine

## 2016-05-22 ENCOUNTER — Other Ambulatory Visit: Payer: Self-pay | Admitting: Family Medicine

## 2016-05-22 MED ORDER — PREDNISONE 20 MG PO TABS: ORAL_TABLET | ORAL | 0 refills | Status: DC

## 2016-05-29 ENCOUNTER — Encounter: Payer: Self-pay | Admitting: Family Medicine

## 2016-05-29 ENCOUNTER — Ambulatory Visit (INDEPENDENT_AMBULATORY_CARE_PROVIDER_SITE_OTHER): Payer: 59 | Admitting: Family Medicine

## 2016-05-29 VITALS — BP 142/84 | HR 84 | Temp 97.7°F | Wt 315.5 lb

## 2016-05-29 DIAGNOSIS — J449 Chronic obstructive pulmonary disease, unspecified: Secondary | ICD-10-CM | POA: Insufficient documentation

## 2016-05-29 DIAGNOSIS — H6693 Otitis media, unspecified, bilateral: Secondary | ICD-10-CM | POA: Diagnosis not present

## 2016-05-29 DIAGNOSIS — J42 Unspecified chronic bronchitis: Secondary | ICD-10-CM

## 2016-05-29 MED ORDER — AZITHROMYCIN 250 MG PO TABS: ORAL_TABLET | ORAL | 0 refills | Status: DC

## 2016-05-29 NOTE — Progress Notes (Signed)
Pre visit review using our clinic review tool, if applicable. No additional management support is needed unless otherwise documented below in the visit note. 

## 2016-05-29 NOTE — Patient Instructions (Signed)
You have fluid in the ears as well as likely persistent mild chronic bronchitis flare - treat with zpack antibiotic. Continue qvar regularly. Add on albuterol inhaler 2 puffs a few times a day over the next 2-3 days to help with cough and shortness of breath.

## 2016-05-29 NOTE — Assessment & Plan Note (Addendum)
Pt endorses h/o this. Remote pulm eval. Clinically more consistent with asthma exacerbation ?cough variant asthma. He has recently completed prednisone taper by PCP. After recent plane ride, evidence of serous otitis if not acute otitis media. Treat with zpack. Lungs largely clear today - did not extend prednisone course. rec Qvar BID dosing as well as scheduled albuterol over the next 2-3 days. Update if not improved with treatment. Pt agrees with plan.

## 2016-05-29 NOTE — Progress Notes (Signed)
BP (!) 142/84   Pulse 84   Temp 97.7 F (36.5 C) (Oral)   Wt (!) 315 lb 8 oz (143.1 kg)   SpO2 96%   BMI 44.31 kg/m    CC: cough/ congestion Subjective:    Patient ID: Richard Giles, male    DOB: May 24, 1960, 56 y.o.   MRN: [id number]  HPI: Richard Giles is a 56 y.o. male presenting on 05/29/2016 for URI (cough,congestion x1 week)   Out of town this past week to [place]. Returned yesterday.  Over the past week noticing increased productive cough, dyspnea, wheezing, more sputum production than normal. Head and chest congestion. Mild ST with PNdrainage. Some ear pain.  Recently completed prednisone taper by PCP for COPD attack. Did not fully improve.   No fevers/chills, tooth pain, headaches. Sleeping well.   Triggers are weather changes and smoke.  Non smoker  H/o COPD chronic  bronchitis. Controlled with Qvar daily (2 puffs in am). Has seen Dr Melvyn Novas. He does have albuterol rescue inhaler.   Known diabetic -  Lab Results  Component Value Date   HGBA1C 6.4 12/13/2015    Relevant past medical, surgical, family and social history reviewed and updated as indicated. Interim medical history since our last visit reviewed. Allergies and medications reviewed and updated. Outpatient Medications Prior to Visit  Medication Sig Dispense Refill  . acetaminophen (TYLENOL) 500 MG tablet Take 1,000 mg by mouth every 6 (six) hours as needed (pain).     Marland Kitchen albuterol (PROVENTIL HFA;VENTOLIN HFA) 108 (90 Base) MCG/ACT inhaler Inhale 2 puffs into the lungs every 6 (six) hours as needed for wheezing or shortness of breath. 1 Inhaler 0  . allopurinol (ZYLOPRIM) 300 MG tablet TAKE 1 TABLET BY MOUTH DAILY 90 tablet 1  . amLODipine (NORVASC) 10 MG tablet Take 1 tablet (10 mg total) by mouth daily. 30 tablet 2  . aspirin 81 MG tablet Take 81 mg by mouth daily.    Marland Kitchen atorvastatin (LIPITOR) 20 MG tablet TAKE 1 TABLET BY MOUTH DAILY 90 tablet 1  . beclomethasone (QVAR) 80 MCG/ACT inhaler Inhale 1 puff into  the lungs 2 (two) times daily. 1 Inhaler 3  . cyclobenzaprine (FLEXERIL) 5 MG tablet Take 1 tablet (5 mg total) by mouth at bedtime. 30 tablet 1  . dextromethorphan (DELSYM) 30 MG/5ML liquid Take 60 mg by mouth daily as needed for cough.     . famotidine (PEPCID) 20 MG tablet Take 20 mg by mouth at bedtime.     . fexofenadine (ALLEGRA) 180 MG tablet Take 180 mg by mouth daily.     Marland Kitchen FLUoxetine (PROZAC) 20 MG capsule TAKE ONE CAPSULE BY MOUTH DAILY 90 capsule 1  . furosemide (LASIX) 20 MG tablet TAKE 1 TABLET BY MOUTH DAILY 30 tablet 3  . irbesartan (AVAPRO) 150 MG tablet Take 1 tablet (150 mg total) by mouth daily. 30 tablet 2  . pantoprazole (PROTONIX) 40 MG tablet Take 1 tablet (40 mg total) by mouth daily. 30 tablet 2  . Probiotic Product (PROBIOTIC PO) Take 1 tablet by mouth daily.    Marland Kitchen Respiratory Therapy Supplies (FLUTTER) DEVI Use as directed 1 each 0  . fluticasone furoate-vilanterol (BREO ELLIPTA) 200-25 MCG/INH AEPB Inhale 1 puff into the lungs daily. (Patient not taking: Reported on 05/29/2016) 1 each 0  . predniSONE (DELTASONE) 20 MG tablet Take 3 po qd for 2 days then 2 po qd for 3 days,or as directed 12 tablet 0  . traMADol (ULTRAM) 50  MG tablet 1-2 every 4 hours as needed for cough or pain (Patient not taking: Reported on 05/29/2016) 40 tablet 0   No facility-administered medications prior to visit.      Per HPI unless specifically indicated in ROS section below Review of Systems     Objective:    BP (!) 142/84   Pulse 84   Temp 97.7 F (36.5 C) (Oral)   Wt (!) 315 lb 8 oz (143.1 kg)   SpO2 96%   BMI 44.31 kg/m   Wt Readings from Last 3 Encounters:  05/29/16 (!) 315 lb 8 oz (143.1 kg)  01/25/16 (!) 310 lb 4 oz (140.7 kg)  12/17/15 (!) 306 lb (138.8 kg)    Physical Exam  Constitutional: He appears well-developed and well-nourished. No distress.  HENT:  Head: Normocephalic and atraumatic.  Right Ear: Hearing, external ear and ear canal normal.  Left Ear: Hearing,  external ear and ear canal normal.  Nose: Mucosal edema (nasal mucosal erythema) present. No rhinorrhea. Right sinus exhibits no maxillary sinus tenderness and no frontal sinus tenderness. Left sinus exhibits no maxillary sinus tenderness and no frontal sinus tenderness.  Mouth/Throat: Uvula is midline and mucous membranes are normal. Posterior oropharyngeal edema present. No oropharyngeal exudate, posterior oropharyngeal erythema or tonsillar abscesses.  L>R TM bulging and erythematous with fluid behind TMs  Eyes: Conjunctivae and EOM are normal. Pupils are equal, round, and reactive to light. No scleral icterus.  Neck: Normal range of motion. Neck supple.  Cardiovascular: Normal rate, regular rhythm, normal heart sounds and intact distal pulses.   No murmur heard. Pulmonary/Chest: Effort normal and breath sounds normal. No respiratory distress. He has no wheezes. He has no rales.  Lungs clear  Lymphadenopathy:    He has no cervical adenopathy.  Skin: Skin is warm and dry. No rash noted.  Nursing note and vitals reviewed.     Assessment & Plan:   Problem List Items Addressed This Visit    Chronic bronchitis ([place]) - Primary    Pt endorses h/o this. Remote pulm eval. Clinically more consistent with asthma exacerbation ?cough variant asthma. He has recently completed prednisone taper by PCP. After recent plane ride, evidence of serous otitis if not acute otitis media. Treat with zpack. Lungs largely clear today - did not extend prednisone course. rec Qvar BID dosing as well as scheduled albuterol over the next 2-3 days. Update if not improved with treatment. Pt agrees with plan.        Other Visit Diagnoses    Acute otitis media, bilateral       Relevant Medications   azithromycin (ZITHROMAX) 250 MG tablet       Follow up plan: Return if symptoms worsen or fail to improve.  Ria Bush, MD

## 2016-06-03 ENCOUNTER — Ambulatory Visit (INDEPENDENT_AMBULATORY_CARE_PROVIDER_SITE_OTHER): Payer: 59 | Admitting: Family Medicine

## 2016-06-03 ENCOUNTER — Encounter: Payer: Self-pay | Admitting: Family Medicine

## 2016-06-03 VITALS — BP 134/82 | HR 81 | Temp 97.6°F | Wt 309.5 lb

## 2016-06-03 DIAGNOSIS — J42 Unspecified chronic bronchitis: Secondary | ICD-10-CM

## 2016-06-03 MED ORDER — PREDNISONE 20 MG PO TABS: ORAL_TABLET | ORAL | 0 refills | Status: DC

## 2016-06-03 MED ORDER — FLUTICASONE FUROATE-VILANTEROL 200-25 MCG/INH IN AEPB: 1.0000 | INHALATION_SPRAY | Freq: Every day | RESPIRATORY_TRACT | 0 refills | Status: DC

## 2016-06-03 MED ORDER — LEVOFLOXACIN 500 MG PO TABS: 500.0000 mg | ORAL_TABLET | Freq: Every day | ORAL | 0 refills | Status: DC

## 2016-06-03 NOTE — Patient Instructions (Signed)
Take levaquin as directed- 1 tablet by mouth x 7 days. Start Breo as directed and take prednisone burst as directed.

## 2016-06-03 NOTE — Assessment & Plan Note (Signed)
Lungs are full of wheezes and rhonchi today. I would like him to see pulmonary again. Place him on levaquin and extend prednsione. Also will try breo instead of qvar for a few weeks as this has helped in the past.

## 2016-06-03 NOTE — Progress Notes (Signed)
Subjective:   Patient ID: Richard Giles, male    DOB: 07-27-1960, 56 y.o.   MRN: [id number]  Richard Giles is a pleasant 56 y.o. year old male who presents to clinic today with Acute Visit (x 2 weeks not feeling any better)  on 06/03/2016  HPI:  H/o chronic asthma/bronchitis- emailed me on 05/21/16 asking for prednisone refill as his symptoms were starting again.  Saw Dr. Darnell Level on Friday, 05/29/2016 because symptoms were not much better. Given zpack, Qvar bid.  Here today because cough is worse.  Afebrile.  Wheezing quite a bit.  On Dr. Synthia Innocent exam, lungs were clear so he did not extend course of prednisone.  He has been using his qvar, finished prednisone and zpack.  Current Outpatient Prescriptions on File Prior to Visit  Medication Sig Dispense Refill  . acetaminophen (TYLENOL) 500 MG tablet Take 1,000 mg by mouth every 6 (six) hours as needed (pain).     Marland Kitchen albuterol (PROVENTIL HFA;VENTOLIN HFA) 108 (90 Base) MCG/ACT inhaler Inhale 2 puffs into the lungs every 6 (six) hours as needed for wheezing or shortness of breath. 1 Inhaler 0  . allopurinol (ZYLOPRIM) 300 MG tablet TAKE 1 TABLET BY MOUTH DAILY 90 tablet 1  . amLODipine (NORVASC) 10 MG tablet Take 1 tablet (10 mg total) by mouth daily. 30 tablet 2  . aspirin 81 MG tablet Take 81 mg by mouth daily.    Marland Kitchen atorvastatin (LIPITOR) 20 MG tablet TAKE 1 TABLET BY MOUTH DAILY 90 tablet 1  . azithromycin (ZITHROMAX) 250 MG tablet Take two tablets on day one followed by one tablet on days 2-5 6 each 0  . beclomethasone (QVAR) 80 MCG/ACT inhaler Inhale 1 puff into the lungs 2 (two) times daily. 1 Inhaler 3  . cyclobenzaprine (FLEXERIL) 5 MG tablet Take 1 tablet (5 mg total) by mouth at bedtime. 30 tablet 1  . dextromethorphan (DELSYM) 30 MG/5ML liquid Take 60 mg by mouth daily as needed for cough.     . famotidine (PEPCID) 20 MG tablet Take 20 mg by mouth at bedtime.     . fexofenadine (ALLEGRA) 180 MG tablet Take 180 mg by mouth daily.     Marland Kitchen  FLUoxetine (PROZAC) 20 MG capsule TAKE ONE CAPSULE BY MOUTH DAILY 90 capsule 1  . furosemide (LASIX) 20 MG tablet TAKE 1 TABLET BY MOUTH DAILY 30 tablet 3  . irbesartan (AVAPRO) 150 MG tablet Take 1 tablet (150 mg total) by mouth daily. 30 tablet 2  . pantoprazole (PROTONIX) 40 MG tablet Take 1 tablet (40 mg total) by mouth daily. 30 tablet 2  . Probiotic Product (PROBIOTIC PO) Take 1 tablet by mouth daily.    Marland Kitchen Respiratory Therapy Supplies (FLUTTER) DEVI Use as directed 1 each 0   No current facility-administered medications on file prior to visit.     Allergies  Allergen Reactions  . Bee Venom Anaphylaxis  . Codeine Diarrhea and Nausea And Vomiting  . Lisinopril Cough    Past Medical History:  Diagnosis Date  . Allergy    allergy workup with Dr. Caprice Red : Maybe trees Did not receive shots per patient  . Chronic bronchitis ([place])   . Chronic cough onset 2005/ resolved on ICS July 19,2011   Sinus CT negative 07/08/2009 negative  . Chronic kidney disease    per pt 01-04-2015  . COPD (chronic obstructive pulmonary disease) ([place])    per pt 01-04-15  . Diabetes mellitus without complication ([place])   .  GERD (gastroesophageal reflux disease)   . Hyperlipidemia   . Hypertension   . Restless leg syndrome   . Sleep apnea    does not have to use cpap    Past Surgical History:  Procedure Laterality Date  . bialteral inguinal hernia  1967  . COLONOSCOPY    . FOOT SURGERY  09/03/2003   Left foot osteophye removal (Dr. Marlou Sa)  . POLYPECTOMY    . SEPTOPLASTY  1983   at 56 years of age    Family History  Problem Relation Age of Onset  . Hypertension Father   . Colon polyps Father   . Diabetes Maternal Grandmother   . Depression Neg Hx   . Drug abuse Neg Hx   . Stroke Neg Hx   . Colon cancer Neg Hx   . Stomach cancer Neg Hx   . Esophageal cancer Neg Hx   . Rectal cancer Neg Hx     Social History   Social History  . Marital status: Married    Spouse name: N/A  .  Number of children: N/A  . Years of education: N/A   Occupational History  . Not on file.   Social History Main Topics  . Smoking status: Never Smoker  . Smokeless tobacco: Former Systems developer    Quit date: 02/23/1993  . Alcohol use No  . Drug use: No  . Sexual activity: Not on file   Other Topics Concern  . Not on file   Social History Narrative  . No narrative on file   The PMH, PSH, Social History, Family History, Medications, and allergies have been reviewed in [facility], and have been updated if relevant.  Review of Systems  Constitutional: Negative.   HENT: Negative for congestion, rhinorrhea, sinus pain, sneezing, sore throat, tinnitus, trouble swallowing and voice change.   Respiratory: Positive for cough, shortness of breath and wheezing. Negative for apnea, choking and chest tightness.   Cardiovascular: Negative.   Neurological: Negative.   All other systems reviewed and are negative.      Objective:    BP 134/82   Pulse 81   Temp 97.6 F (36.4 C)   Wt (!) 309 lb 8 oz (140.4 kg)   SpO2 98%   BMI 43.47 kg/m    Physical Exam  Constitutional: He is oriented to person, place, and time. He appears well-developed and well-nourished. No distress.  HENT:  Head: Normocephalic and atraumatic.  Eyes: Conjunctivae are normal.  Cardiovascular: Normal rate.   Pulmonary/Chest: Effort normal. He has wheezes.  Musculoskeletal: Normal range of motion. He exhibits no edema.  Neurological: He is alert and oriented to person, place, and time. No cranial nerve deficit.  Skin: Skin is warm and dry. He is not diaphoretic.  Psychiatric: He has a normal mood and affect. His behavior is normal. Judgment and thought content normal.  Nursing note and vitals reviewed.         Assessment & Plan:   Chronic bronchitis, unspecified chronic bronchitis type ([place]) No Follow-up on file.

## 2016-06-15 ENCOUNTER — Other Ambulatory Visit: Payer: Self-pay | Admitting: Family Medicine

## 2016-06-30 ENCOUNTER — Other Ambulatory Visit: Payer: Self-pay | Admitting: Family Medicine

## 2016-07-06 ENCOUNTER — Other Ambulatory Visit: Payer: Self-pay | Admitting: Family Medicine

## 2016-07-06 DIAGNOSIS — I10 Essential (primary) hypertension: Secondary | ICD-10-CM

## 2016-07-13 ENCOUNTER — Other Ambulatory Visit: Payer: Self-pay | Admitting: Family Medicine

## 2016-09-19 IMAGING — US US BIOPSY
1 series · 10 of 10 positions shown · non-contrast
Comparison: none

CLINICAL DATA: 53-year-old with proteinuria and microscopic
hematuria. Request for a renal biopsy.

[Series 1: us biopsy · 0.27mm/px · 10 of 10 slices shown]
[im 1/10]
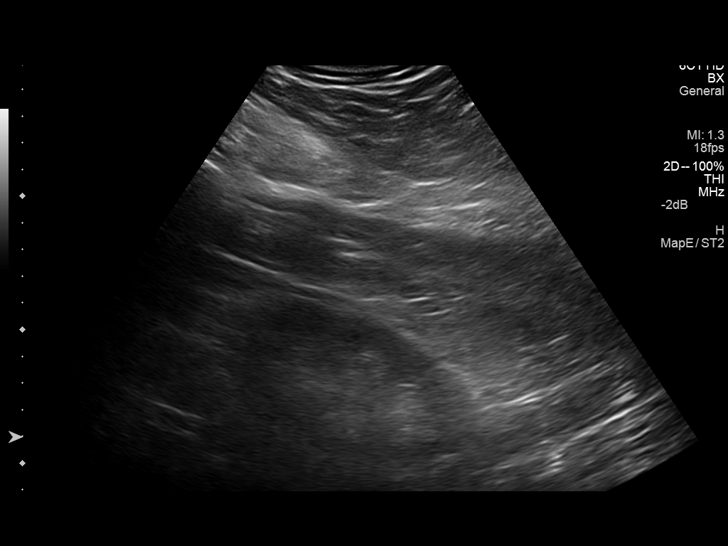
[im 2/10]
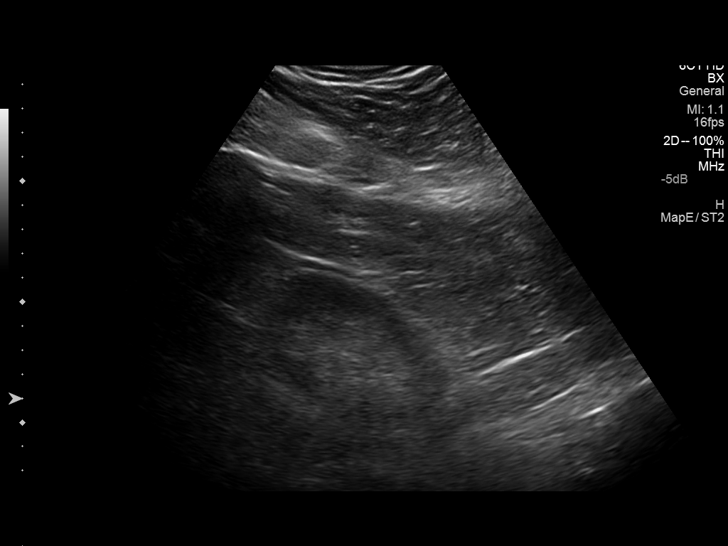
[im 3/10]
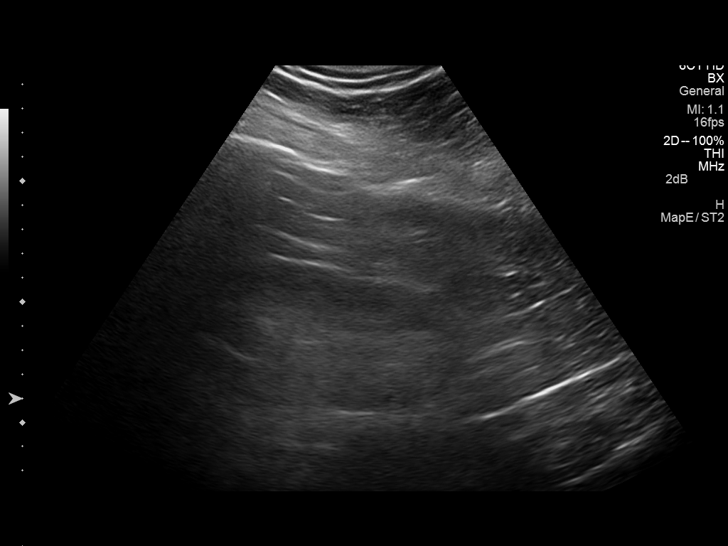
[im 4/10]
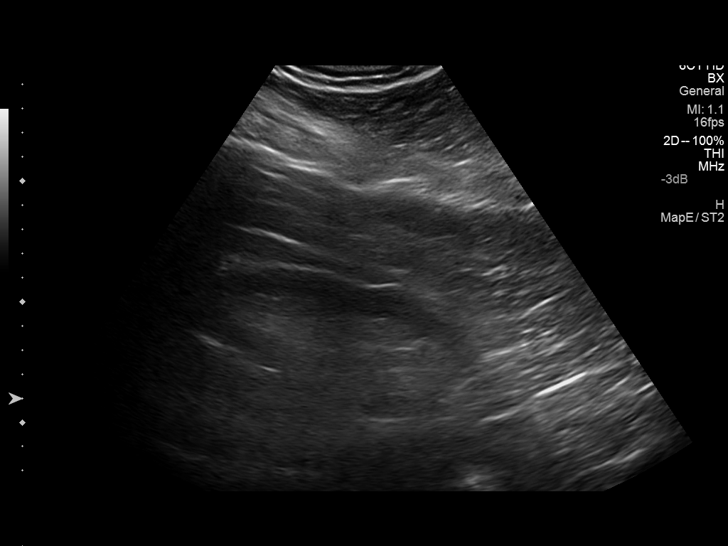
[im 5/10]
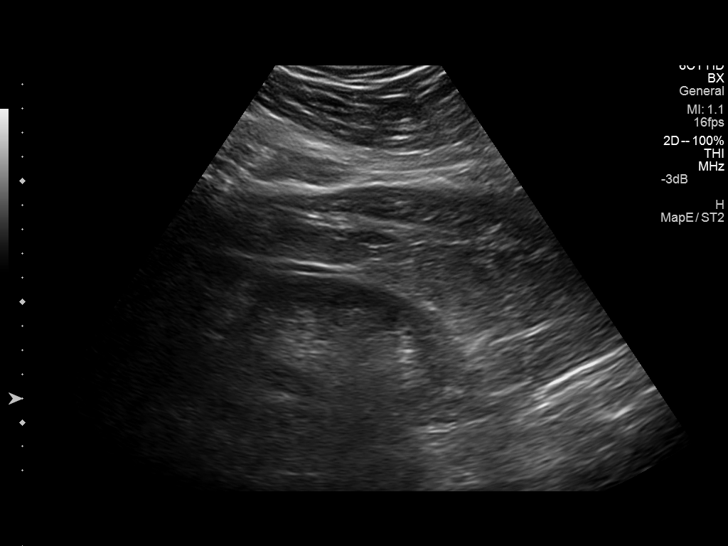
[im 6/10]
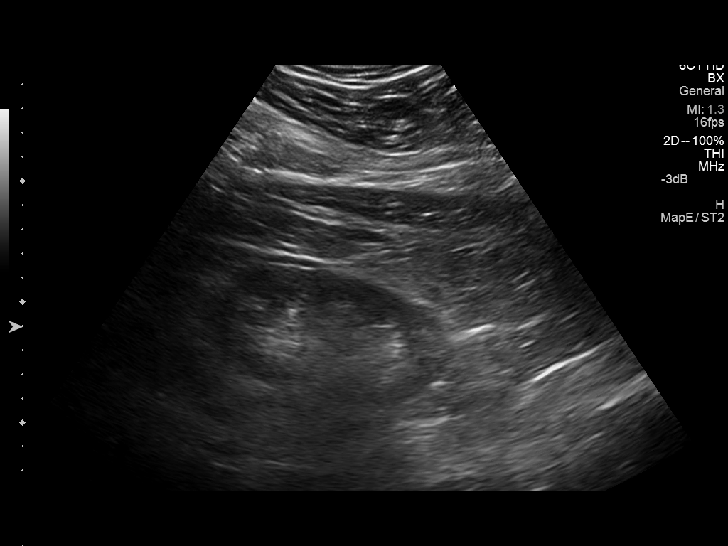
[im 7/10]
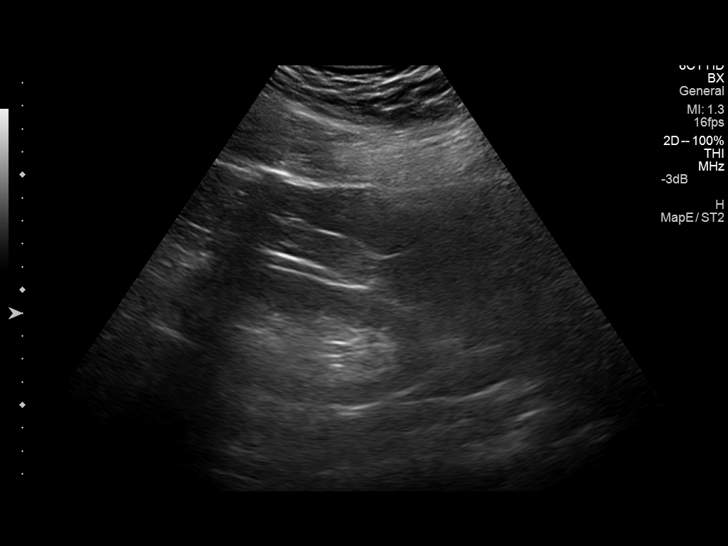
[im 8/10]
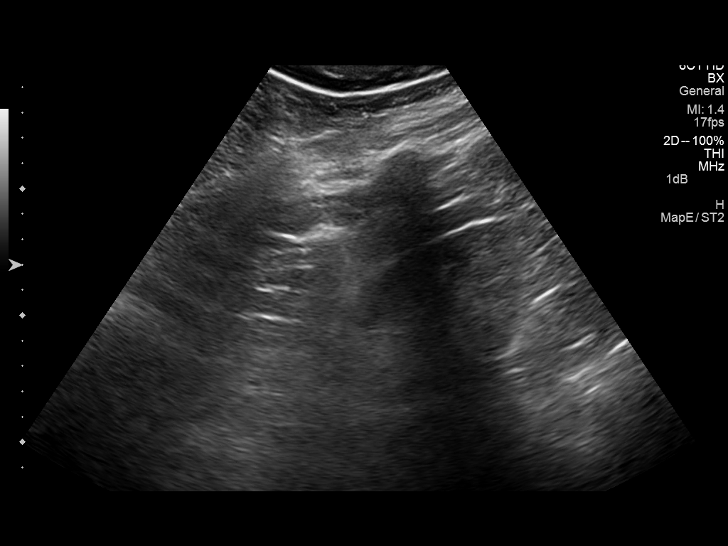
[im 9/10]
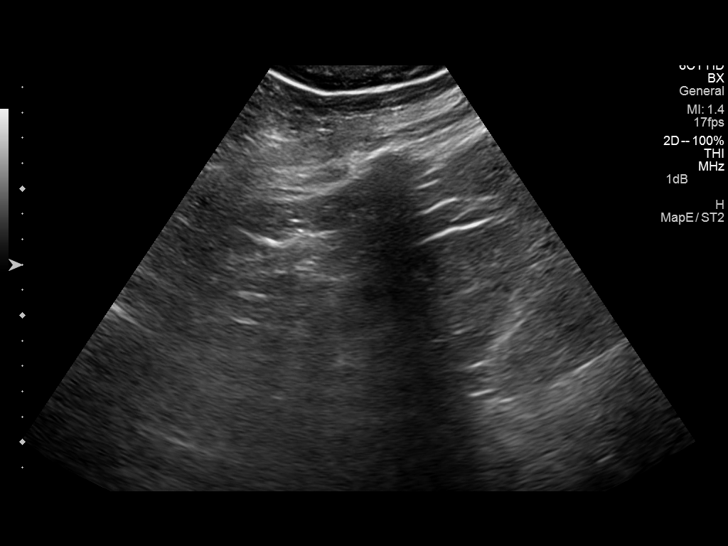
[im 10/10]
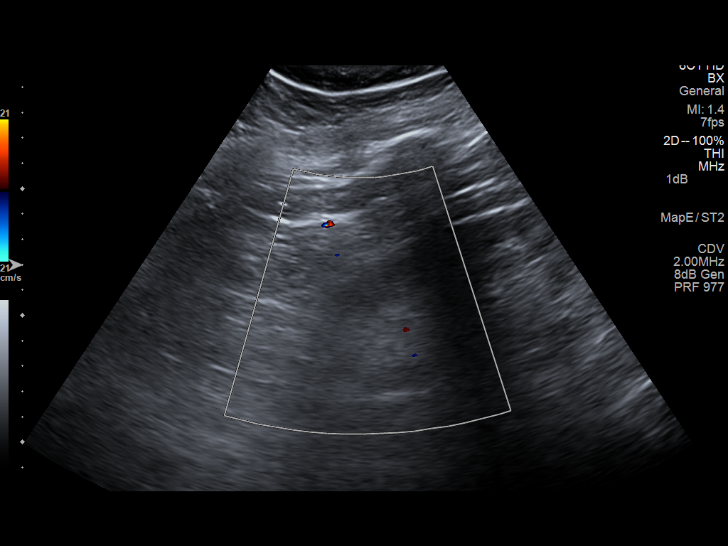

[10 of 10 positions shown; findings below may reference images not displayed]

EXAM:
ULTRASOUND-GUIDED RIGHT RENAL BIOPSY

FLUOROSCOPY TIME:  None

MEDICATIONS:
2 mg Versed, 50 mcg fentanyl. A radiology nurse monitored the
patient for moderate sedation.

ANESTHESIA/SEDATION:
Moderate sedation time: 15 minutes

PROCEDURE:
The procedure was explained to the patient. The risks and benefits
of the procedure were discussed and the patient's questions were
addressed. Specifically, the risks of bleeding were discussed.
Informed consent was obtained from the patient.

The patient was placed prone on the ultrasound examination table.
Both kidneys were evaluated with ultrasound. The right kidney was
felt to be most accessible for percutaneous biopsy. The right flank
was prepped with Betadine and a sterile field was created. The skin
and soft tissues were anesthetized with 1% lidocaine. A 16 gauge
core needle was directed into the right kidney lower pole with
ultrasound guidance. A core biopsy was obtained. Specimen was placed
in saline. A second core biopsy was obtained from the lower pole
using ultrasound guidance. Bandage placed over the puncture site.

Estimated blood loss: Minimal
FINDINGS: Negative for hydronephrosis. The right kidney lower pole was
targeted. Biopsy needle was directed into the lower pole cortex. No
significant bleeding or hematoma following the core biopsies.

COMPLICATIONS:
None
IMPRESSION: Ultrasound-guided core biopsies of the right kidney lower pole.

## 2016-10-02 ENCOUNTER — Other Ambulatory Visit: Payer: Self-pay | Admitting: Family Medicine

## 2016-10-03 ENCOUNTER — Encounter: Payer: Self-pay | Admitting: Family Medicine

## 2016-10-06 ENCOUNTER — Other Ambulatory Visit: Payer: Self-pay | Admitting: Family Medicine

## 2016-10-06 MED ORDER — BECLOMETHASONE DIPROP HFA 80 MCG/ACT IN AERB: INHALATION_SPRAY | RESPIRATORY_TRACT | 2 refills | Status: DC

## 2016-10-12 ENCOUNTER — Other Ambulatory Visit: Payer: Self-pay | Admitting: Family Medicine

## 2016-11-11 IMAGING — CR DG CHEST 2V
2 series · 2 of 2 positions shown · non-contrast
Comparison: None.

CLINICAL DATA: Acute cough, bronchitis for 1 week.  History COPD.

EXAM:
CHEST  2 VIEW

[view not recorded (1 of 2)]
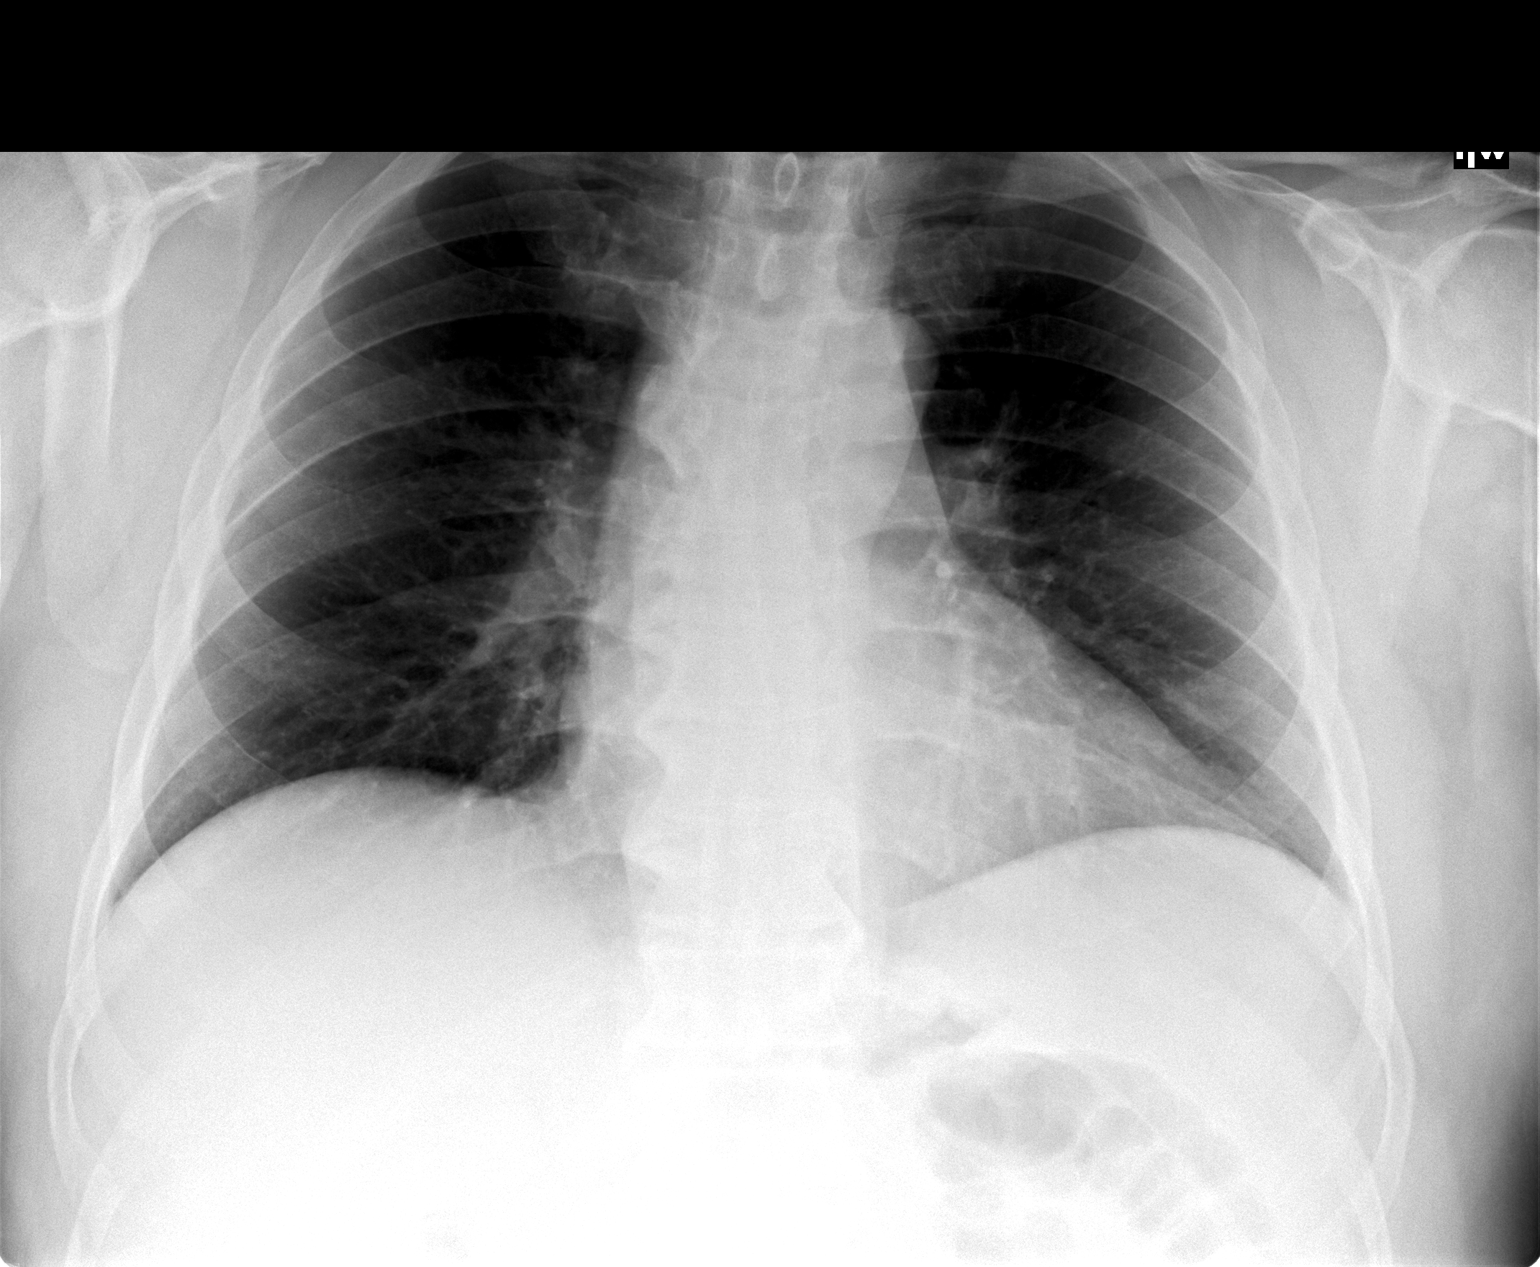

[view not recorded (2 of 2)]
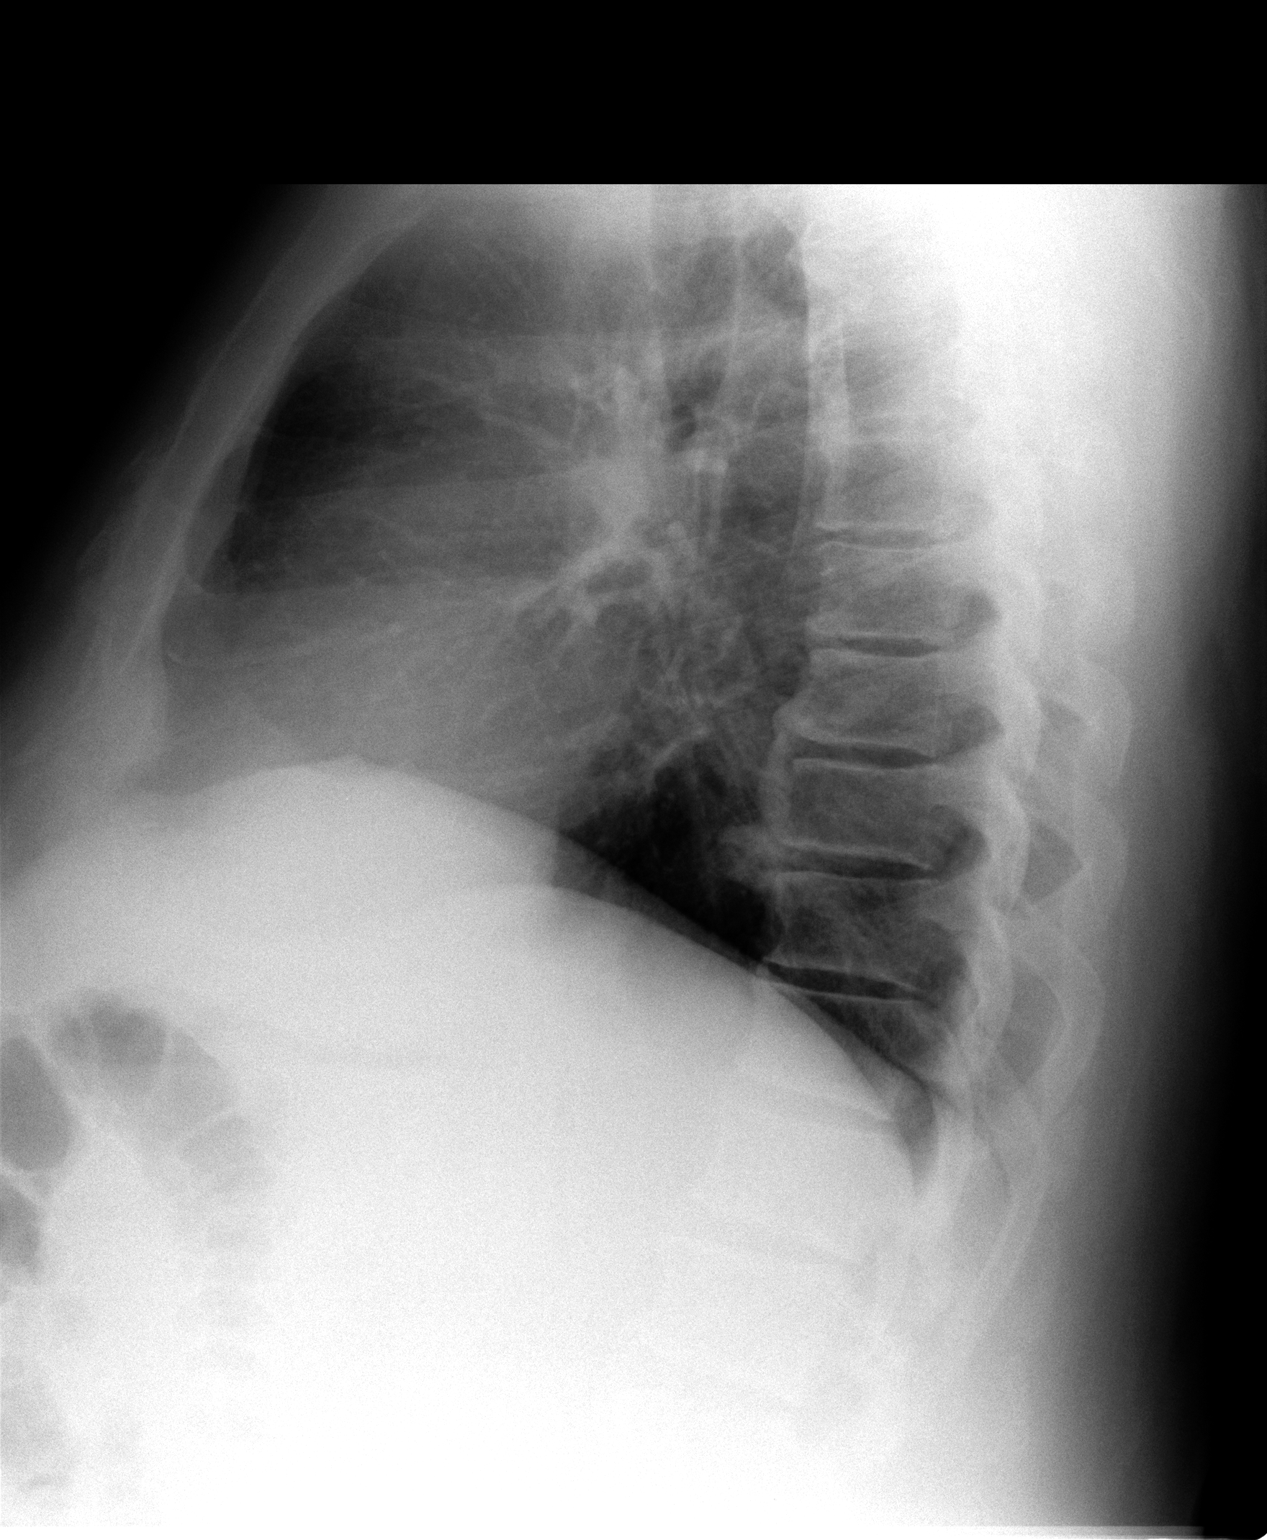

[2 of 2 positions shown; findings below may reference images not displayed]

FINDINGS: Mild decreased lung volumes. Normal heart size and vascularity.
Lungs remain clear. No focal pneumonia, collapse or consolidation.
No edema, effusion, or pneumothorax. Degenerative changes of the
spine.
IMPRESSION: Low volume exam.  No superimposed acute process or interval change.

## 2016-12-10 ENCOUNTER — Other Ambulatory Visit: Payer: Self-pay | Admitting: Family Medicine

## 2017-01-01 ENCOUNTER — Other Ambulatory Visit: Payer: Self-pay | Admitting: Family Medicine

## 2017-01-01 DIAGNOSIS — I10 Essential (primary) hypertension: Secondary | ICD-10-CM

## 2017-01-11 ENCOUNTER — Other Ambulatory Visit: Payer: Self-pay | Admitting: Family Medicine

## 2017-01-15 ENCOUNTER — Other Ambulatory Visit: Payer: Self-pay | Admitting: Family Medicine

## 2017-01-30 ENCOUNTER — Other Ambulatory Visit: Payer: Self-pay | Admitting: Family Medicine

## 2017-01-30 DIAGNOSIS — I10 Essential (primary) hypertension: Secondary | ICD-10-CM

## 2017-02-08 ENCOUNTER — Other Ambulatory Visit: Payer: Self-pay | Admitting: Family Medicine

## 2017-02-10 ENCOUNTER — Other Ambulatory Visit: Payer: Self-pay | Admitting: Family Medicine

## 2017-02-10 NOTE — Telephone Encounter (Signed)
Patient must sched appt first/thx dmf

## 2017-02-10 NOTE — Telephone Encounter (Signed)
Nov fill pt was advised to sched appt/denied with req for pharm to advise pt to sched appt/thx dmf

## 2017-02-18 ENCOUNTER — Other Ambulatory Visit: Payer: Self-pay | Admitting: Family Medicine

## 2017-02-19 MED ORDER — FLUOXETINE HCL 20 MG PO CAPS: 20.0000 mg | ORAL_CAPSULE | Freq: Every day | ORAL | 0 refills | Status: DC

## 2017-03-08 ENCOUNTER — Other Ambulatory Visit: Payer: Self-pay | Admitting: Family Medicine

## 2017-03-09 NOTE — Telephone Encounter (Signed)
Advised pt in Dec to sched appt/denied/thx dmf

## 2017-04-09 ENCOUNTER — Ambulatory Visit (INDEPENDENT_AMBULATORY_CARE_PROVIDER_SITE_OTHER): Payer: Managed Care, Other (non HMO) | Admitting: Family Medicine

## 2017-04-09 ENCOUNTER — Encounter: Payer: Self-pay | Admitting: Family Medicine

## 2017-04-09 VITALS — BP 116/74 | HR 97 | Temp 97.3°F | Wt 319.0 lb

## 2017-04-09 DIAGNOSIS — S39012A Strain of muscle, fascia and tendon of lower back, initial encounter: Secondary | ICD-10-CM | POA: Diagnosis not present

## 2017-04-09 MED ORDER — PREDNISONE 10 MG PO TABS: ORAL_TABLET | ORAL | 0 refills | Status: DC

## 2017-04-09 MED ORDER — CYCLOBENZAPRINE HCL 10 MG PO TABS: 10.0000 mg | ORAL_TABLET | Freq: Three times a day (TID) | ORAL | 0 refills | Status: AC | PRN

## 2017-04-09 NOTE — Patient Instructions (Signed)
Low Back Strain A strain is a stretch or tear in a muscle or the strong cords of tissue that attach muscle to bone (tendons). Strains of the lower back (lumbar spine) are a common cause of low back pain. A strain occurs when muscles or tendons are torn or are stretched beyond their limits. The muscles may become inflamed, resulting in pain and sudden muscle tightening (spasms). A strain can happen suddenly due to an injury (trauma), or it can develop gradually due to overuse. There are three types of strains:  Grade 1 is a mild strain involving a minor tear of the muscle fibers or tendons. This may cause some pain but no loss of muscle strength.  Grade 2 is a moderate strain involving a partial tear of the muscle fibers or tendons. This causes more severe pain and some loss of muscle strength.  Grade 3 is a severe strain involving a complete tear of the muscle or tendon. This causes severe pain and complete or nearly complete loss of muscle strength. What are the causes? This condition may be caused by:  Trauma, such as a fall or a hit to the body.  Twisting or overstretching the back. This may result from doing activities that require a lot of energy, such as lifting heavy objects. What increases the risk? The following factors may increase your risk of getting this condition:  Playing contact sports.  Participating in sports or activities that put excessive stress on the back and require a lot of bending and twisting, including:  Lifting weights or heavy objects.  Gymnastics.  Soccer.  Figure skating.  Snowboarding.  Being overweight or obese.  Having poor strength and flexibility. What are the signs or symptoms? Symptoms of this condition may include:  Sharp or dull pain in the lower back that does not go away. Pain may extend to the buttocks.  Stiffness.  Limited range of motion.  Inability to stand up straight due to stiffness or pain.  Muscle spasms. How is this  diagnosed?   This condition may be diagnosed based on:  Your symptoms.  Your medical history.  A physical exam.  Your health care provider may push on certain areas of your back to determine the source of your pain.  You may be asked to bend forward, backward, and side to side to assess the severity of your pain and your range of motion.  Imaging tests, such as:  X-rays.  MRI. How is this treated? Treatment for this condition may include:  Applying heat and cold to the affected area.  Medicines to help relieve pain and to relax your muscles (muscle relaxants).  NSAIDs to help reduce swelling and discomfort.  Physical therapy. When your symptoms improve, it is important to gradually return to your normal routine as soon as possible to reduce pain, avoid stiffness, and avoid loss of muscle strength. Generally, symptoms should improve within 6 weeks of treatment. However, recovery time varies. Follow these instructions at home: Managing pain, stiffness, and swelling  If directed, apply ice to the injured area during the first 24 hours after your injury.  Put ice in a plastic bag.  Place a towel between your skin and the bag.  Leave the ice on for 20 minutes, 2-3 times a day.  If directed, apply heat to the affected area as often as told by your health care provider. Use the heat source that your health care provider recommends, such as a moist heat pack or a heating pad.    or a heating pad. ? Place a towel between your skin and the heat source. ? Leave the heat on for 20-30 minutes. ? Remove the heat if your skin turns bright red. This is especially important if you are unable to feel pain, heat, or cold. You may have a greater risk of getting burned. Activity  Rest and return to your normal activities as told by your health care provider. Ask your health care provider what activities are safe for you.  Avoid activities that take a lot of effort (are strenuous) for as long as told  by your health care provider.  Do exercises as told by your health care provider. General instructions   Take over-the-counter and prescription medicines only as told by your health care provider.  If you have questions or concerns about safety while taking pain medicine, talk with your health care provider.  Do not drive or operate heavy machinery until you know how your pain medicine affects you.  Do not use any tobacco products, such as cigarettes, chewing tobacco, and e-cigarettes. Tobacco can delay bone healing. If you need help quitting, ask your health care provider.  Keep all follow-up visits as told by your health care provider. This is important. How is this prevented?  Warm up and stretch before being active.  Cool down and stretch after being active.  Give your body time to rest between periods of activity.  Avoid: ? Being physically inactive for long periods at a time. ? Exercising or playing sports when you are tired or in pain.  Use correct form when playing sports and lifting heavy objects.  Use good posture when sitting and standing.  Maintain a healthy weight.  Sleep on a mattress with medium firmness to support your back.  Make sure to use equipment that fits you, including shoes that fit well.  Be safe and responsible while being active to avoid falls.  Do at least 150 minutes of moderate-intensity exercise each week, such as brisk walking or water aerobics. Try a form of exercise that takes stress off your back, such as swimming or stationary cycling.  Maintain physical fitness, including: ? Strength. ? Flexibility. ? Cardiovascular fitness. ? Endurance. Contact a health care provider if:  Your back pain does not improve after 6 weeks of treatment.  Your symptoms get worse. Get help right away if:  Your back pain is severe.  You are unable to stand or walk.  You develop pain in your legs.  You develop weakness in your buttocks or  legs.  You have difficulty controlling when you urinate or when you have a bowel movement. This information is not intended to replace advice given to you by your health care provider. Make sure you discuss any questions you have with your health care provider. Document Released: 02/09/2005 Document Revised: 10/17/2015 Document Reviewed: 11/21/2014 Elsevier Interactive Patient Education  2017 [employer].   Back Exercises If you have pain in your back, do these exercises 2-3 times each day or as told by your doctor. When the pain goes away, do the exercises once each day, but repeat the steps more times for each exercise (do more repetitions). If you do not have pain in your back, do these exercises once each day or as told by your doctor. Exercises Single Knee to Chest  Do these steps 3-5 times in a row for each leg: 1. Lie on your back on a firm bed or the floor with your legs stretched out. 2.  Bring one knee to your chest. 3. Hold your knee to your chest by grabbing your knee or thigh. 4. Pull on your knee until you feel a gentle stretch in your lower back. 5. Keep doing the stretch for 10-30 seconds. 6. Slowly let go of your leg and straighten it.  Pelvic Tilt  Do these steps 5-10 times in a row: 1. Lie on your back on a firm bed or the floor with your legs stretched out. 2. Bend your knees so they point up to the ceiling. Your feet should be flat on the floor. 3. Tighten your lower belly (abdomen) muscles to press your lower back against the floor. This will make your tailbone point up to the ceiling instead of pointing down to your feet or the floor. 4. Stay in this position for 5-10 seconds while you gently tighten your muscles and breathe evenly.  Cat-Cow  Do these steps until your lower back bends more easily: 1. Get on your hands and knees on a firm surface. Keep your hands under your shoulders, and keep your knees under your hips. You may put padding under your  knees. 2. Let your head hang down, and make your tailbone point down to the floor so your lower back is round like the back of a cat. 3. Stay in this position for 5 seconds. 4. Slowly lift your head and make your tailbone point up to the ceiling so your back hangs low (sags) like the back of a cow. 5. Stay in this position for 5 seconds.  Press-Ups  Do these steps 5-10 times in a row: 1. Lie on your belly (face-down) on the floor. 2. Place your hands near your head, about shoulder-width apart. 3. While you keep your back relaxed and keep your hips on the floor, slowly straighten your arms to raise the top half of your body and lift your shoulders. Do not use your back muscles. To make yourself more comfortable, you may change where you place your hands. 4. Stay in this position for 5 seconds. 5. Slowly return to lying flat on the floor.  Bridges  Do these steps 10 times in a row: 1. Lie on your back on a firm surface. 2. Bend your knees so they point up to the ceiling. Your feet should be flat on the floor. 3. Tighten your butt muscles and lift your butt off of the floor until your waist is almost as high as your knees. If you do not feel the muscles working in your butt and the back of your thighs, slide your feet 1-2 inches farther away from your butt. 4. Stay in this position for 3-5 seconds. 5. Slowly lower your butt to the floor, and let your butt muscles relax.  If this exercise is too easy, try doing it with your arms crossed over your chest. Belly Crunches  Do these steps 5-10 times in a row: 1. Lie on your back on a firm bed or the floor with your legs stretched out. 2. Bend your knees so they point up to the ceiling. Your feet should be flat on the floor. 3. Cross your arms over your chest. 4. Tip your chin a little bit toward your chest but do not bend your neck. 5. Tighten your belly muscles and slowly raise your chest just enough to lift your shoulder blades a tiny bit  off of the floor. 6. Slowly lower your chest and your head to the floor.  Back Lifts Do  these steps 5-10 times in a row: 1. Lie on your belly (face-down) with your arms at your sides, and rest your forehead on the floor. 2. Tighten the muscles in your legs and your butt. 3. Slowly lift your chest off of the floor while you keep your hips on the floor. Keep the back of your head in line with the curve in your back. Look at the floor while you do this. 4. Stay in this position for 3-5 seconds. 5. Slowly lower your chest and your face to the floor.  Contact a doctor if:  Your back pain gets a lot worse when you do an exercise.  Your back pain does not lessen 2 hours after you exercise. If you have any of these problems, stop doing the exercises. Do not do them again unless your doctor says it is okay. Get help right away if:  You have sudden, very bad back pain. If this happens, stop doing the exercises. Do not do them again unless your doctor says it is okay. This information is not intended to replace advice given to you by your health care provider. Make sure you discuss any questions you have with your health care provider. Document Released: 03/14/2010 Document Revised: 07/18/2015 Document Reviewed: 04/05/2014 Elsevier Interactive Patient Education  [employer].

## 2017-04-09 NOTE — Progress Notes (Signed)
Subjective:    Patient ID: Richard Giles, male    DOB: 09-Mar-1960, 57 y.o.   MRN: [id number]  HPI This is a 57 yo male who presents today with back pain x 5 days. No known injury. Worked in yard prior to pain starting. Pain is on low back on both sides and into right groin. Pain is achy, sharp with moving in the bed. No numbness or tingling, no weakness. Took flexeril 5 mg at night, did not help. Applied TENS unit without. No urinary changes, no changes in bowels. Has CKD and sees nephrology. Can not take NSAIDs.  Wife with sarcoma. Increased stress. Poor food choices. [place] with animals to care for.    Past Medical History:  Diagnosis Date  . Allergy    allergy workup with Dr. Caprice Red : Maybe trees Did not receive shots per patient  . Chronic bronchitis ([place])   . Chronic cough onset 2005/ resolved on ICS July 19,2011   Sinus CT negative 07/08/2009 negative  . Chronic kidney disease    per pt 01-04-2015  . COPD (chronic obstructive pulmonary disease) ([place])    per pt 01-04-15  . Diabetes mellitus without complication ([place])   . GERD (gastroesophageal reflux disease)   . Hyperlipidemia   . Hypertension   . Restless leg syndrome   . Sleep apnea    does not have to use cpap   Past Surgical History:  Procedure Laterality Date  . bialteral inguinal hernia  1967  . COLONOSCOPY    . FOOT SURGERY  09/03/2003   Left foot osteophye removal (Dr. Marlou Sa)  . POLYPECTOMY    . SEPTOPLASTY  1983   at 57 years of age   Family History  Problem Relation Age of Onset  . Hypertension Father   . Colon polyps Father   . Diabetes Maternal Grandmother   . Depression Neg Hx   . Drug abuse Neg Hx   . Stroke Neg Hx   . Colon cancer Neg Hx   . Stomach cancer Neg Hx   . Esophageal cancer Neg Hx   . Rectal cancer Neg Hx    Social History   Tobacco Use  . Smoking status: Never Smoker  . Smokeless tobacco: Former Network engineer Use Topics  . Alcohol use: No  . Drug use: No        Review of Systems Per HPI    Objective:   Physical Exam  Constitutional: He is oriented to person, place, and time. He appears well-developed and well-nourished. No distress.  Obese.   HENT:  Head: Normocephalic and atraumatic.  Eyes: Conjunctivae and EOM are normal. Pupils are equal, round, and reactive to light. Right eye exhibits no discharge. Left eye exhibits no discharge. No scleral icterus.  Neck: Normal range of motion. Neck supple.  Cardiovascular: Normal rate, regular rhythm and normal heart sounds.  Pulmonary/Chest: Effort normal and breath sounds normal.  Abdominal: Soft. Bowel sounds are normal. He exhibits no distension. There is no tenderness. There is no rebound and no guarding.  Musculoskeletal:       Cervical back: Normal.       Thoracic back: Normal.       Lumbar back: He exhibits tenderness (bilateral muscular) and bony tenderness. He exhibits normal range of motion, no swelling and no deformity.  Neurological: He is alert and oriented to person, place, and time. He has normal strength and normal reflexes. No cranial nerve deficit. Coordination and gait  normal.  Skin: Skin is warm and dry. He is not diaphoretic.  Psychiatric: He has a normal mood and affect. His behavior is normal. Judgment and thought content normal.  Vitals reviewed.     BP 116/74 (BP Location: Left Arm, Patient Position: Sitting, Cuff Size: Large)   Pulse 97   Temp (!) 97.3 F (36.3 C) (Oral)   Wt (!) 319 lb (144.7 kg)   SpO2 94%   BMI 44.81 kg/m  Wt Readings from Last 3 Encounters:  04/09/17 (!) 319 lb (144.7 kg)  06/03/16 (!) 309 lb 8 oz (140.4 kg)  05/29/16 (!) 315 lb 8 oz (143.1 kg)       Assessment & Plan:  1. Strain of lumbar region, initial encounter - Provided written and verbal information regarding diagnosis and treatment. - encouraged him to do stretching BID, apply heat prn - RTC precautions reviewed - predniSONE (DELTASONE) 10 MG tablet; Take 5x1, 4x1, 3x1,  2x1, 1x1  Dispense: 15 tablet; Refill: 0 - cyclobenzaprine (FLEXERIL) 10 MG tablet; Take 1 tablet (10 mg total) by mouth 3 (three) times daily as needed for muscle spasms.  Dispense: 20 tablet; Refill: 0  - over due for CPE, encouraged him to schedule  Clarene Reamer, FNP-BC  [facility]  04/10/2017 8:29 AM

## 2017-04-10 ENCOUNTER — Encounter: Payer: Self-pay | Admitting: Family Medicine

## 2017-04-18 ENCOUNTER — Other Ambulatory Visit: Payer: Self-pay | Admitting: Family Medicine

## 2017-06-30 ENCOUNTER — Encounter: Payer: Self-pay | Admitting: Primary Care

## 2017-06-30 ENCOUNTER — Ambulatory Visit (INDEPENDENT_AMBULATORY_CARE_PROVIDER_SITE_OTHER): Payer: Managed Care, Other (non HMO) | Admitting: Primary Care

## 2017-06-30 VITALS — BP 122/76 | HR 95 | Temp 98.7°F | Ht 70.75 in | Wt 317.0 lb

## 2017-06-30 DIAGNOSIS — J069 Acute upper respiratory infection, unspecified: Secondary | ICD-10-CM | POA: Diagnosis not present

## 2017-06-30 MED ORDER — ALBUTEROL SULFATE HFA 108 (90 BASE) MCG/ACT IN AERS: 2.0000 | INHALATION_SPRAY | RESPIRATORY_TRACT | 0 refills | Status: DC | PRN

## 2017-06-30 MED ORDER — PREDNISONE 20 MG PO TABS: ORAL_TABLET | ORAL | 0 refills | Status: DC

## 2017-06-30 NOTE — Progress Notes (Signed)
Subjective:    Patient ID: Richard Giles, male    DOB: September 10, 1960, 57 y.o.   MRN: [id number]  HPI  Mr. Coluccio is a 57 year old male with a history of hypertension, asthma, chronic bronchitis, type 2 diabetes, tobacco abuse (quit in 1995) who presents today with a chief complaint of cough.  He also reports sinus pressure, nasal congestion. His symptoms began 4-5 days ago. His cough is worse at night. He denies fevers. His cough is sometimes productive with clear sputum. He's been using his Qvar inhaler daily as prescribed and an "allergy pill". He's not had his albuterol inhaler in over one year.   Review of Systems  Constitutional: Negative for chills, fatigue and fever.  HENT: Positive for congestion, postnasal drip and sinus pressure.   Respiratory: Positive for cough. Negative for shortness of breath and wheezing.   Cardiovascular: Negative for chest pain.  Allergic/Immunologic: Positive for environmental allergies.       Past Medical History:  Diagnosis Date  . Allergy    allergy workup with Dr. Caprice Red : Maybe trees Did not receive shots per patient  . Chronic bronchitis ([place])   . Chronic cough onset 2005/ resolved on ICS July 19,2011   Sinus CT negative 07/08/2009 negative  . Chronic kidney disease    per pt 01-04-2015  . COPD (chronic obstructive pulmonary disease) ([place])    per pt 01-04-15  . Diabetes mellitus without complication ([place])   . GERD (gastroesophageal reflux disease)   . Hyperlipidemia   . Hypertension   . Restless leg syndrome   . Sleep apnea    does not have to use cpap     Social History   Socioeconomic History  . Marital status: Married    Spouse name: Not on file  . Number of children: Not on file  . Years of education: Not on file  . Highest education level: Not on file  Occupational History  . Not on file  Social Needs  . Financial resource strain: Not on file  . Food insecurity:    Worry: Not on file    Inability: Not on file  .  Transportation needs:    Medical: Not on file    Non-medical: Not on file  Tobacco Use  . Smoking status: Never Smoker  . Smokeless tobacco: Former Network engineer and Sexual Activity  . Alcohol use: No  . Drug use: No  . Sexual activity: Not on file  Lifestyle  . Physical activity:    Days per week: Not on file    Minutes per session: Not on file  . Stress: Not on file  Relationships  . Social connections:    Talks on phone: Not on file    Gets together: Not on file    Attends religious service: Not on file    Active member of club or organization: Not on file    Attends meetings of clubs or organizations: Not on file    Relationship status: Not on file  . Intimate partner violence:    Fear of current or ex partner: Not on file    Emotionally abused: Not on file    Physically abused: Not on file    Forced sexual activity: Not on file  Other Topics Concern  . Not on file  Social History Narrative  . Not on file    Past Surgical History:  Procedure Laterality Date  . bialteral inguinal hernia  1967  . COLONOSCOPY    .  FOOT SURGERY  09/03/2003   Left foot osteophye removal (Dr. Marlou Sa)  . POLYPECTOMY    . SEPTOPLASTY  1983   at 57 years of age    Family History  Problem Relation Age of Onset  . Hypertension Father   . Colon polyps Father   . Diabetes Maternal Grandmother   . Depression Neg Hx   . Drug abuse Neg Hx   . Stroke Neg Hx   . Colon cancer Neg Hx   . Stomach cancer Neg Hx   . Esophageal cancer Neg Hx   . Rectal cancer Neg Hx     Allergies  Allergen Reactions  . Bee Venom Anaphylaxis  . [place]et Venom Anaphylaxis  . Lisinopril Cough    Other reaction(s): Cough Other reaction(s): Cough  . Codeine Diarrhea, Nausea And Vomiting and Rash    Current Outpatient Medications on File Prior to Visit  Medication Sig Dispense Refill  . acetaminophen (TYLENOL) 500 MG tablet Take 1,000 mg by mouth every 6 (six) hours as needed (pain).     Marland Kitchen albuterol  (PROVENTIL HFA;VENTOLIN HFA) 108 (90 Base) MCG/ACT inhaler Inhale 2 puffs into the lungs every 6 (six) hours as needed for wheezing or shortness of breath. 1 Inhaler 0  . allopurinol (ZYLOPRIM) 300 MG tablet TAKE 1 TABLET BY MOUTH DAILY 90 tablet 1  . amLODipine (NORVASC) 10 MG tablet TAKE 1 TABLET BY MOUTH ONCE A DAY *NEED OFFICE VISIT 30 tablet 0  . aspirin 81 MG tablet Take 81 mg by mouth daily.    Marland Kitchen atorvastatin (LIPITOR) 20 MG tablet TAKE 1 TABLET BY MOUTH DAILY 90 tablet 0  . beclomethasone (QVAR REDIHALER) 80 MCG/ACT inhaler INHALE 1 PUFF INTO THE LUNGS TWICE DAILY AS DIRECTED. *RINSE MOUTH AFTER USE* 10.6 g 2  . BREO ELLIPTA 200-25 MCG/INH AEPB INHALE 1 PUFF INTO THE LUNGS ONCE DAILY 60 each 5  . cyclobenzaprine (FLEXERIL) 10 MG tablet Take 1 tablet (10 mg total) by mouth 3 (three) times daily as needed for muscle spasms. 20 tablet 0  . dextromethorphan (DELSYM) 30 MG/5ML liquid Take 60 mg by mouth daily as needed for cough.     . famotidine (PEPCID) 20 MG tablet Take 20 mg by mouth at bedtime.     . fexofenadine (ALLEGRA) 180 MG tablet Take 180 mg by mouth daily.     Marland Kitchen FLUoxetine (PROZAC) 20 MG capsule Take 1 capsule (20 mg total) by mouth daily. 15 capsule 0  . furosemide (LASIX) 20 MG tablet TAKE 1 TABLET BY MOUTH ONCE A DAY *NEED OFFICE VISIT 30 tablet 0  . irbesartan (AVAPRO) 150 MG tablet Take 1 tablet (150 mg total) by mouth daily. 30 tablet 2  . Probiotic Product (PROBIOTIC PO) Take 1 tablet by mouth daily.    Marland Kitchen Respiratory Therapy Supplies (FLUTTER) DEVI Use as directed 1 each 0   No current facility-administered medications on file prior to visit.     BP 122/76   Pulse 95   Temp 98.7 F (37.1 C) (Oral)   Ht 5' 10.75" (1.797 m)   Wt (!) 317 lb (143.8 kg)   SpO2 96%   BMI 44.53 kg/m    Objective:   Physical Exam  Constitutional: He appears well-nourished. He does not appear ill.  HENT:  Right Ear: Tympanic membrane and ear canal normal.  Left Ear: Tympanic membrane  and ear canal normal.  Nose: Mucosal edema present. Right sinus exhibits no maxillary sinus tenderness and no frontal sinus tenderness. Left  sinus exhibits no maxillary sinus tenderness and no frontal sinus tenderness.  Mouth/Throat: Oropharynx is clear and moist.  Eyes: Conjunctivae are normal.  Neck: Neck supple.  Cardiovascular: Normal rate and regular rhythm.  Pulmonary/Chest: Effort normal and breath sounds normal. He has no wheezes. He has no rales.  Right upper lobe congestion, clears with cough.  Skin: Skin is warm and dry.          Assessment & Plan:  URI:  Cough, sinus pressure, nasal congestion, PND x 3-4 days.  Exam today overall stable, lungs mostly clear, doesn't appear sickly.  Do suspect viral involvement likely also some allergy involvement.  Rx for Prednisone course and albuterol inhaler sent to pharmacy.  Discussed cough suppressant, he will start with Delsym. He will update if symptoms do not improve within 3-4 days, consider antibiotic treatment at that time, but do still suspect viral involvement at this point.  Fluids, rest, follow up PRN.  Pleas Koch, NP

## 2017-06-30 NOTE — Patient Instructions (Signed)
Start prednisone 20 mg tablets. Take 2 tablets daily for 5 days.  Shortness of Breath/Wheezing/Cough: Use the albuterol inhaler. Inhale 2 puffs into the lungs every 4 to 6 hours as needed for wheezing, cough, and/or shortness of breath.   You can try Delsym over the counter for cough.   Make sure to drink plenty of fluids.  Please call me Monday next week if your symptoms have not improved, you develop fevers of 101, you feel worse.  It was a pleasure meeting you!

## 2017-07-07 ENCOUNTER — Telehealth: Payer: Self-pay | Admitting: Family Medicine

## 2017-07-07 DIAGNOSIS — J069 Acute upper respiratory infection, unspecified: Secondary | ICD-10-CM

## 2017-07-07 MED ORDER — AZITHROMYCIN 250 MG PO TABS
ORAL_TABLET | ORAL | 0 refills | Status: DC
Start: 2017-07-07 — End: 2018-01-28

## 2017-07-07 NOTE — Telephone Encounter (Signed)
Please notify patient to start Azithromycin antibiotics for infection, I sent this to his pharmacy. Take 2 tablets by mouth today, then 1 tablet daily for 4 additional days. Please tell him that I hope he starts to feel better soon.

## 2017-07-07 NOTE — Telephone Encounter (Signed)
Spoken and notified patient of Kate Clark's comments. Patient verbalized understanding.  

## 2017-07-07 NOTE — Telephone Encounter (Signed)
Copied from [place] [id number]. Topic: Quick Communication - See Telephone Encounter >> Jul 07, 2017  8:33 AM Bea Graff, NT wrote: CRM for notification. See Telephone encounter for: 07/07/17. Pt calling and states he saw Alma Friendly last week for bronchitis and he is not feeling any better. He wants to see if something else can be called in to help him. [id number] [address], [place] [phone] (Phone) [phone] (Fax)

## 2017-07-09 ENCOUNTER — Other Ambulatory Visit: Payer: Self-pay | Admitting: Primary Care

## 2017-07-09 DIAGNOSIS — J069 Acute upper respiratory infection, unspecified: Secondary | ICD-10-CM

## 2017-07-09 NOTE — Telephone Encounter (Signed)
KC-Pt is requesting Prednisone/He saw you a couple weeks ago and am unsure if this is appropriate? If so can you send in? Plz advise/thx dmf

## 2017-07-11 NOTE — Telephone Encounter (Signed)
Anda Kraft out of the office, he would need reevaluation

## 2017-08-18 ENCOUNTER — Other Ambulatory Visit: Payer: Self-pay | Admitting: Family Medicine

## 2017-08-19 ENCOUNTER — Other Ambulatory Visit: Payer: Self-pay | Admitting: Family Medicine

## 2017-08-19 MED ORDER — BECLOMETHASONE DIPROP HFA 80 MCG/ACT IN AERB: INHALATION_SPRAY | RESPIRATORY_TRACT | 0 refills | Status: DC

## 2017-08-19 NOTE — Telephone Encounter (Signed)
Copied from [place] [id number]. Topic: Quick Communication - Rx Refill/Question >> Aug 19, 2017 11:21 AM Oliver Pila B wrote: Medication: Meade Maw 80 MCG/ACT inhaler [[id number]]   [facility] (on pt's chart) called and asked for pcp to resend medication to them; they had a computer error and cannot pull up the medication; call pharmacy if needed

## 2017-08-19 NOTE — Telephone Encounter (Signed)
Rx resent.

## 2017-11-13 ENCOUNTER — Other Ambulatory Visit: Payer: Self-pay | Admitting: Family Medicine

## 2017-12-27 ENCOUNTER — Other Ambulatory Visit: Payer: Self-pay | Admitting: Family Medicine

## 2018-01-28 ENCOUNTER — Encounter: Payer: Self-pay | Admitting: Nurse Practitioner

## 2018-01-28 ENCOUNTER — Ambulatory Visit (INDEPENDENT_AMBULATORY_CARE_PROVIDER_SITE_OTHER): Payer: Managed Care, Other (non HMO) | Admitting: Nurse Practitioner

## 2018-01-28 VITALS — BP 132/76 | HR 84 | Temp 97.5°F | Ht 70.75 in | Wt 289.0 lb

## 2018-01-28 DIAGNOSIS — J069 Acute upper respiratory infection, unspecified: Secondary | ICD-10-CM | POA: Diagnosis not present

## 2018-01-28 MED ORDER — SALINE SPRAY 0.65 % NA SOLN: 1.0000 | NASAL | 0 refills | Status: DC | PRN

## 2018-01-28 MED ORDER — CETIRIZINE HCL 10 MG PO TABS: 10.0000 mg | ORAL_TABLET | Freq: Every day | ORAL | 0 refills | Status: AC

## 2018-01-28 MED ORDER — PREDNISONE 20 MG PO TABS: ORAL_TABLET | ORAL | 0 refills | Status: DC

## 2018-01-28 MED ORDER — GUAIFENESIN ER 600 MG PO TB12: 600.0000 mg | ORAL_TABLET | Freq: Two times a day (BID) | ORAL | 0 refills | Status: DC | PRN

## 2018-01-28 NOTE — Patient Instructions (Signed)
Upper Respiratory Infection, Adult Most upper respiratory infections (URIs) are caused by a virus. A URI affects the nose, throat, and upper air passages. The most common type of URI is often called "the common cold." Follow these instructions at home:  Take medicines only as told by your doctor.  Gargle warm saltwater or take cough drops to comfort your throat as told by your doctor.  Use a warm mist humidifier or inhale steam from a shower to increase air moisture. This may make it easier to breathe.  Drink enough fluid to keep your pee (urine) clear or pale yellow.  Eat soups and other clear broths.  Have a healthy diet.  Rest as needed.  Go back to work when your fever is gone or your doctor says it is okay. ? You may need to stay home longer to avoid giving your URI to others. ? You can also wear a face mask and wash your hands often to prevent spread of the virus.  Use your inhaler more if you have asthma.  Do not use any tobacco products, including cigarettes, chewing tobacco, or electronic cigarettes. If you need help quitting, ask your doctor. Contact a doctor if:  You are getting worse, not better.  Your symptoms are not helped by medicine.  You have chills.  You are getting more short of breath.  You have brown or red mucus.  You have yellow or brown discharge from your nose.  You have pain in your face, especially when you bend forward.  You have a fever.  You have puffy (swollen) neck glands.  You have pain while swallowing.  You have white areas in the back of your throat. Get help right away if:  You have very bad or constant: ? Headache. ? Ear pain. ? Pain in your forehead, behind your eyes, and over your cheekbones (sinus pain). ? Chest pain.  You have long-lasting (chronic) lung disease and any of the following: ? Wheezing. ? Long-lasting cough. ? Coughing up blood. ? A change in your usual mucus.  You have a stiff neck.  You have  changes in your: ? Vision. ? Hearing. ? Thinking. ? Mood. This information is not intended to replace advice given to you by your health care provider. Make sure you discuss any questions you have with your health care provider. Document Released: 07/29/2007 Document Revised: 10/13/2015 Document Reviewed: 05/17/2013 Elsevier Interactive Patient Education  2018 [employer]  

## 2018-01-28 NOTE — Progress Notes (Signed)
Subjective:  Patient ID: Richard Giles, male    DOB: 06-13-1960  Age: 57 y.o. MRN: [id number]  CC: Nasal Congestion (pt is c/o of cold,congestions,ears stop up, yellow mucus/ took mucinex,otc meds/ 5 days.Marland Kitchen )  URI   This is a new problem. The current episode started in the past 7 days. The problem has been unchanged. There has been no fever. Associated symptoms include congestion, coughing, ear pain, headaches, a plugged ear sensation, rhinorrhea, sinus pain and sneezing. Pertinent negatives include no chest pain, dysuria, joint pain, nausea, sore throat, swollen glands or wheezing. He has tried decongestant for the symptoms. The treatment provided mild relief.   Reviewed past Medical, Social and Family history today.  Outpatient Medications Prior to Visit  Medication Sig Dispense Refill  . acetaminophen (TYLENOL) 500 MG tablet Take 1,000 mg by mouth every 6 (six) hours as needed (pain).     Marland Kitchen albuterol (PROVENTIL HFA;VENTOLIN HFA) 108 (90 Base) MCG/ACT inhaler Inhale 2 puffs into the lungs every 4 (four) hours as needed for wheezing or shortness of breath. 1 Inhaler 0  . allopurinol (ZYLOPRIM) 300 MG tablet TAKE 1 TABLET BY MOUTH DAILY 90 tablet 1  . amLODipine (NORVASC) 10 MG tablet TAKE 1 TABLET BY MOUTH ONCE A DAY *NEED OFFICE VISIT 30 tablet 0  . aspirin 81 MG tablet Take 81 mg by mouth daily.    Marland Kitchen atorvastatin (LIPITOR) 20 MG tablet TAKE 1 TABLET BY MOUTH DAILY 90 tablet 0  . BREO ELLIPTA 200-25 MCG/INH AEPB INHALE 1 PUFF INTO THE LUNGS ONCE DAILY 60 each 5  . cyclobenzaprine (FLEXERIL) 10 MG tablet Take 1 tablet (10 mg total) by mouth 3 (three) times daily as needed for muscle spasms. 20 tablet 0  . famotidine (PEPCID) 20 MG tablet Take 20 mg by mouth at bedtime.     . fexofenadine (ALLEGRA) 180 MG tablet Take 180 mg by mouth daily.     Marland Kitchen FLUoxetine (PROZAC) 20 MG capsule Take 1 capsule (20 mg total) by mouth daily. 15 capsule 0  . furosemide (LASIX) 20 MG tablet TAKE 1 TABLET BY  MOUTH ONCE A DAY *NEED OFFICE VISIT 30 tablet 0  . irbesartan (AVAPRO) 150 MG tablet Take 1 tablet (150 mg total) by mouth daily. 30 tablet 2  . Probiotic Product (PROBIOTIC PO) Take 1 tablet by mouth daily.    Marland Kitchen QVAR REDIHALER 80 MCG/ACT inhaler USE ONE PUFF TWICE A DAY TO PREVENT COUGH OR WHEEZING. RINSE, GARGLE, AND SPIT AFTER USE 10.6 g 0  . Respiratory Therapy Supplies (FLUTTER) DEVI Use as directed 1 each 0  . dextromethorphan (DELSYM) 30 MG/5ML liquid Take 60 mg by mouth daily as needed for cough.     Marland Kitchen azithromycin (ZITHROMAX) 250 MG tablet Take 2 tablets by mouth today, then 1 tablet daily for 4 additional days. (Patient not taking: Reported on 01/28/2018) 6 tablet 0  . predniSONE (DELTASONE) 20 MG tablet Take 2 tablets daily for 5 days. (Patient not taking: Reported on 01/28/2018) 10 tablet 0   No facility-administered medications prior to visit.     ROS See HPI  Objective:  BP 132/76   Pulse 84   Temp (!) 97.5 F (36.4 C) (Oral)   Ht 5' 10.75" (1.797 m)   Wt 289 lb (131.1 kg)   SpO2 96%   BMI 40.59 kg/m   BP Readings from Last 3 Encounters:  01/28/18 132/76  06/30/17 122/76  04/09/17 116/74    Wt Readings from Last 3  Encounters:  01/28/18 289 lb (131.1 kg)  06/30/17 (!) 317 lb (143.8 kg)  04/09/17 (!) 319 lb (144.7 kg)    Physical Exam  Constitutional: He is oriented to person, place, and time. He appears well-developed and well-nourished.  HENT:  Right Ear: External ear and ear canal normal. Tympanic membrane is not injected. A middle ear effusion is present.  Left Ear: External ear and ear canal normal. Tympanic membrane is not injected. A middle ear effusion is present.  Nose: Mucosal edema and rhinorrhea present. Right sinus exhibits maxillary sinus tenderness and frontal sinus tenderness. Left sinus exhibits maxillary sinus tenderness and frontal sinus tenderness.  Mouth/Throat: No trismus in the jaw. Posterior oropharyngeal erythema present. No oropharyngeal  exudate. Tonsils are 2+ on the right. Tonsils are 2+ on the left.  Eyes: Pupils are equal, round, and reactive to light. Conjunctivae and EOM are normal.  Neck: Normal range of motion. Neck supple.  Cardiovascular: Normal rate and regular rhythm.  Pulmonary/Chest: Effort normal and breath sounds normal. He has no wheezes. He has no rales.  Lymphadenopathy:    He has no cervical adenopathy.  Neurological: He is alert and oriented to person, place, and time.  Vitals reviewed.   Lab Results  Component Value Date   WBC 10.9 (H) 12/13/2015   HGB 13.5 12/13/2015   HCT 39.2 12/13/2015   PLT 244.0 12/13/2015   GLUCOSE 132 (H) 12/13/2015   CHOL 167 12/13/2015   TRIG 215.0 (H) 12/13/2015   HDL 39.40 12/13/2015   LDLDIRECT 90.0 12/13/2015   LDLCALC 178 (H) 07/03/2013   ALT 21 12/13/2015   AST 18 12/13/2015   NA 138 12/13/2015   K 5.2 (H) 12/13/2015   CL 102 12/13/2015   CREATININE 1.94 (H) 12/13/2015   BUN 38 (H) 12/13/2015   CO2 29 12/13/2015   PSA 0.63 12/13/2015   INR 0.93 05/03/2014   HGBA1C 6.4 12/13/2015   MICROALBUR 157.0 (H) 07/11/2014    Ct Maxillofacial Ltd Wo Cm  Result Date: 08/10/2014 CLINICAL DATA:  Productive cough with green sputum for 10 weeks. EXAM: CT PARANASAL SINUS LIMITED WITHOUT CONTRAST TECHNIQUE: Non-contiguous multidetector CT images of the paranasal sinuses were obtained in a single plane without contrast. COMPARISON:  07/11/2009. FINDINGS: The sphenoid, frontal, and ethmoid sinuses are clear. There is minor mucosal thickening in the BILATERAL maxillary sinuses. No air-fluid level is present. No nasal cavity masses. No significant turbinate hypertrophy. Similar appearance to priors. IMPRESSION: Minor mucosal thickening in the BILATERAL maxillary sinuses without air-fluid level. Electronically Signed   By: Staci Righter M.D.   On: 08/10/2014 17:05    Assessment & Plan:   [place] was seen today for nasal congestion.  Diagnoses and all orders for this  visit:  Acute upper respiratory infection -     sodium chloride (OCEAN) 0.65 % SOLN nasal spray; Place 1 spray into both nostrils as needed for congestion. -     guaiFENesin (MUCINEX) 600 MG 12 hr tablet; Take 1 tablet (600 mg total) by mouth 2 (two) times daily as needed for cough or to loosen phlegm. -     cetirizine (ZYRTEC) 10 MG tablet; Take 1 tablet (10 mg total) by mouth daily. -     predniSONE (DELTASONE) 20 MG tablet; Take 3 tablets (60 mg total) by mouth daily with breakfast for 1 day, THEN 2 tablets (40 mg total) daily with breakfast for 3 days.   I have discontinued Comer D. Leja's dextromethorphan, predniSONE, and azithromycin. I am also  having him start on sodium chloride, guaiFENesin, cetirizine, and predniSONE. Additionally, I am having him maintain his acetaminophen, famotidine, fexofenadine, Probiotic Product (PROBIOTIC PO), irbesartan, FLUTTER, aspirin, allopurinol, BREO ELLIPTA, furosemide, amLODipine, FLUoxetine, cyclobenzaprine, atorvastatin, albuterol, and QVAR REDIHALER.  Meds ordered this encounter  Medications  . sodium chloride (OCEAN) 0.65 % SOLN nasal spray    Sig: Place 1 spray into both nostrils as needed for congestion.    Dispense:  15 mL    Refill:  0    Order Specific Question:   Supervising Provider    Answer:   MATTHEWS, CODY [[id number]]  . guaiFENesin (MUCINEX) 600 MG 12 hr tablet    Sig: Take 1 tablet (600 mg total) by mouth 2 (two) times daily as needed for cough or to loosen phlegm.    Dispense:  14 tablet    Refill:  0    Order Specific Question:   Supervising Provider    Answer:   MATTHEWS, CODY [[id number]]  . cetirizine (ZYRTEC) 10 MG tablet    Sig: Take 1 tablet (10 mg total) by mouth daily.    Dispense:  14 tablet    Refill:  0    Order Specific Question:   Supervising Provider    Answer:   MATTHEWS, CODY [[id number]]  . predniSONE (DELTASONE) 20 MG tablet    Sig: Take 3 tablets (60 mg total) by mouth daily with breakfast for 1 day, THEN 2 tablets (40 mg  total) daily with breakfast for 3 days.    Dispense:  9 tablet    Refill:  0    Order Specific Question:   Supervising Provider    Answer:   MATTHEWS, CODY [[id number]]    Problem List Items Addressed This Visit    None    Visit Diagnoses    Acute upper respiratory infection    -  Primary   Relevant Medications   sodium chloride (OCEAN) 0.65 % SOLN nasal spray   guaiFENesin (MUCINEX) 600 MG 12 hr tablet   cetirizine (ZYRTEC) 10 MG tablet   predniSONE (DELTASONE) 20 MG tablet       Follow-up: No follow-ups on file.  Wilfred Lacy, NP

## 2018-02-04 DIAGNOSIS — D235 Other benign neoplasm of skin of trunk: Secondary | ICD-10-CM

## 2018-02-04 HISTORY — DX: Other benign neoplasm of skin of trunk: D23.5

## 2018-02-10 LAB — BASIC METABOLIC PANEL
BUN: 30 — AB (ref 4–21)
Creatinine: 2 — AB (ref 0.6–1.3)
Glucose: 112
Potassium: 5.2 (ref 3.4–5.3)
Sodium: 138 (ref 137–147)

## 2018-02-10 LAB — HEMOGLOBIN A1C: Hemoglobin A1C: 6.1

## 2018-02-10 LAB — MICROALBUMIN, URINE: Microalb, Ur: 104.8

## 2018-02-20 DIAGNOSIS — N183 Chronic kidney disease, stage 3 unspecified: Secondary | ICD-10-CM | POA: Insufficient documentation

## 2018-02-20 MED ORDER — IRBESARTAN 300 MG PO TABS: 300.0000 mg | ORAL_TABLET | Freq: Every day | ORAL | Status: DC

## 2018-02-20 NOTE — Progress Notes (Signed)
Subjective:    Patient ID: Richard Giles, male    DOB: [birth year], 57 y.o.   MRN: [id number]  HPI  Very pleasant 57 yo male here for CPX and follow up of chronic medical conditions.  Health Maintenance  Topic Date Due  . Hepatitis C Screening  Jul 13, 1960  . FOOT EXAM  12/16/2016  . COLONOSCOPY  02/28/2018 (Originally 01/13/2018)  . OPHTHALMOLOGY EXAM  08/08/2018  . HEMOGLOBIN A1C  08/12/2018  . TETANUS/TDAP  06/24/2021  . INFLUENZA VACCINE  Completed  . PNEUMOCOCCAL POLYSACCHARIDE VACCINE AGE 32-64 HIGH RISK  Completed  . HIV Screening  Discontinued   Has had a tough couple of years- wife diagnosed with Sarcoma and has had multiple complications and he lost his job (or will be in 03/2018). He is still very thankful that he is in good health and his wife has been having some better days of late. He feels he is handling it okay.  Very strong faith.  Had skin survey done at [facility] 2 weeks ago.  Chronic bronchitis- has been followed by pulmonary. Last saw Wilfred Lacy, NP, on 01/28/18 for URI symptoms. Note reviewed.  Advised nasal spray, Mucinex 600 mg twice daily, zyrtec 10 mg daily and placed on steroid burst/taper.  Feeling better.  HTN-  BP had been well controlled on Irebesartan 300 mg daily, amlodipine 10 mg, coreg 3.125 mg twice daily and lasix 20 mg daily.    DM- has been diet controlled since he lost weight.  Back on his diet since last year and has lost 45 pounds so far.  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 Followed by [facility] nephrology. Has been managed conservatively with ARB (allergic to ACEI). Last seen by Dr. Percell Miller on 02/10/18.  Note reviewed.  Had CMET, a1c, uric acid and PTH done at that OV.  Lab Results  Component Value Date    CREATININE 2.0 (A) 02/10/2018    HLD- on lipitor 20 mg daily. Lab Results  Component Value Date   CHOL 167 12/13/2015   HDL 39.40 12/13/2015   LDLCALC 178 (H) 07/03/2013   LDLDIRECT 90.0 12/13/2015   TRIG 215.0 (H) 12/13/2015   CHOLHDL 4 12/13/2015   Lab Results  Component Value Date   PSA 0.63 12/13/2015   PSA 0.70 07/11/2014   PSA 0.71 07/03/2013   Lab Results  Component Value Date   WBC 10.9 (H) 12/13/2015   HGB 13.5 12/13/2015   HCT 39.2 12/13/2015   MCV 90.6 12/13/2015   PLT 244.0 12/13/2015   Lab Results  Component Value Date   ALT 21 12/13/2015   AST 18 12/13/2015   ALKPHOS 106 12/13/2015   BILITOT 0.5 12/13/2015   Current Outpatient Medications on File Prior to Visit  Medication Sig Dispense Refill  . acetaminophen (TYLENOL) 500 MG tablet Take 1,000 mg by mouth every 6 (six) hours as needed (pain).     Marland Kitchen albuterol (PROVENTIL HFA;VENTOLIN HFA) 108 (90 Base) MCG/ACT inhaler Inhale 2 puffs into the lungs every 4 (four) hours as needed for wheezing or shortness of breath. 1 Inhaler 0  . allopurinol (ZYLOPRIM) 300 MG tablet TAKE 1 TABLET BY MOUTH DAILY 90 tablet 1  . amLODipine (NORVASC) 10 MG tablet  TAKE 1 TABLET BY MOUTH ONCE A DAY *NEED OFFICE VISIT 30 tablet 0  . aspirin 81 MG tablet Take 81 mg by mouth daily.    Marland Kitchen atorvastatin (LIPITOR) 20 MG tablet TAKE 1 TABLET BY MOUTH DAILY 90 tablet 0  . cetirizine (ZYRTEC) 10 MG tablet Take 1 tablet (10 mg total) by mouth daily. 14 tablet 0  . cyclobenzaprine (FLEXERIL) 10 MG tablet Take 1 tablet (10 mg total) by mouth 3 (three) times daily as needed for muscle spasms. 20 tablet 0  . famotidine (PEPCID) 20 MG tablet Take 20 mg by mouth at bedtime.     . fexofenadine (ALLEGRA) 180 MG tablet Take 180 mg by mouth daily.     Marland Kitchen FLUoxetine (PROZAC) 20 MG capsule Take 1 capsule (20 mg total) by mouth daily. 15 capsule 0  . furosemide (LASIX) 20 MG tablet TAKE 1 TABLET BY MOUTH ONCE A DAY *NEED OFFICE VISIT 30 tablet 0  .  guaiFENesin (MUCINEX) 600 MG 12 hr tablet Take 1 tablet (600 mg total) by mouth 2 (two) times daily as needed for cough or to loosen phlegm. 14 tablet 0  . Probiotic Product (PROBIOTIC PO) Take 1 tablet by mouth daily.    Marland Kitchen QVAR REDIHALER 80 MCG/ACT inhaler USE ONE PUFF TWICE A DAY TO PREVENT COUGH OR WHEEZING. RINSE, GARGLE, AND SPIT AFTER USE 10.6 g 0  . sodium chloride (OCEAN) 0.65 % SOLN nasal spray Place 1 spray into both nostrils as needed for congestion. 15 mL 0   No current facility-administered medications on file prior to visit.     Allergies  Allergen Reactions  . Bee Venom Anaphylaxis  . Hornet Venom Anaphylaxis  . Lisinopril Cough    Other reaction(s): Cough Other reaction(s): Cough  . Codeine Diarrhea, Nausea And Vomiting and Rash    Past Medical History:  Diagnosis Date  . Allergy    allergy workup with Dr. Caprice Red : Maybe trees Did not receive shots per patient  . Chronic bronchitis ([place])   . Chronic cough onset 2005/ resolved on ICS July 19,2011   Sinus CT negative 07/08/2009 negative  . Chronic kidney disease    per pt 01-04-2015  . COPD (chronic obstructive pulmonary disease) ([place])    per pt 01-04-15  . Diabetes mellitus without complication ([place])   . GERD (gastroesophageal reflux disease)   . Hyperlipidemia   . Hypertension   . Restless leg syndrome   . Sleep apnea    does not have to use cpap    Past Surgical History:  Procedure Laterality Date  . bialteral inguinal hernia  1967  . COLONOSCOPY    . FOOT SURGERY  09/03/2003   Left foot osteophye removal (Dr. Marlou Sa)  . POLYPECTOMY    . SEPTOPLASTY  1983   at 57 years of age    Family History  Problem Relation Age of Onset  . Hypertension Father   . Colon polyps Father   . Diabetes Maternal Grandmother   . Depression Neg Hx   . Drug abuse Neg Hx   . Stroke Neg Hx   . Colon cancer Neg Hx   . Stomach cancer Neg Hx   . Esophageal cancer Neg Hx   . Rectal cancer Neg Hx     Social  History   Socioeconomic History  . Marital status: Married    Spouse name: Not on file  . Number of children: Not on file  . Years of education: Not on file  .  Highest education level: Not on file  Occupational History  . Not on file  Social Needs  . Financial resource strain: Not on file  . Food insecurity:    Worry: Not on file    Inability: Not on file  . Transportation needs:    Medical: Not on file    Non-medical: Not on file  Tobacco Use  . Smoking status: Never Smoker  . Smokeless tobacco: Former Network engineer and Sexual Activity  . Alcohol use: No  . Drug use: No  . Sexual activity: Not on file  Lifestyle  . Physical activity:    Days per week: Not on file    Minutes per session: Not on file  . Stress: Not on file  Relationships  . Social connections:    Talks on phone: Not on file    Gets together: Not on file    Attends religious service: Not on file    Active member of club or organization: Not on file    Attends meetings of clubs or organizations: Not on file    Relationship status: Not on file  . Intimate partner violence:    Fear of current or ex partner: Not on file    Emotionally abused: Not on file    Physically abused: Not on file    Forced sexual activity: Not on file  Other Topics Concern  . Not on file  Social History Narrative  . Not on file   The PMH, PSH, Social History, Family History, Medications, and allergies have been reviewed in [facility], and have been updated if relevant.   Review of Systems  Constitutional: Positive for fatigue.  HENT: Negative.   Eyes: Negative.   Respiratory: Positive for cough and shortness of breath.   Cardiovascular: Negative.   Gastrointestinal: Negative.   Endocrine: Negative.   Genitourinary: Negative.  Negative for difficulty urinating.  Musculoskeletal: Negative.   Skin: Negative.   Allergic/Immunologic: Positive for environmental allergies.  Neurological: Negative.   Hematological: Negative.    Psychiatric/Behavioral: Negative.   All other systems reviewed and are negative.       Past Medical History:  Diagnosis Date  . Allergy    allergy workup with Dr. Caprice Red : Maybe trees Did not receive shots per patient  . Chronic bronchitis ([place])   . Chronic cough onset 2005/ resolved on ICS July 19,2011   Sinus CT negative 07/08/2009 negative  . Chronic kidney disease    per pt 01-04-2015  . COPD (chronic obstructive pulmonary disease) ([place])    per pt 01-04-15  . Diabetes mellitus without complication ([place])   . GERD (gastroesophageal reflux disease)   . Hyperlipidemia   . Hypertension   . Restless leg syndrome   . Sleep apnea    does not have to use cpap    Current Outpatient Medications  Medication Sig Dispense Refill  . acetaminophen (TYLENOL) 500 MG tablet Take 1,000 mg by mouth every 6 (six) hours as needed (pain).     Marland Kitchen albuterol (PROVENTIL HFA;VENTOLIN HFA) 108 (90 Base) MCG/ACT inhaler Inhale 2 puffs into the lungs every 4 (four) hours as needed for wheezing or shortness of breath. 1 Inhaler 0  . allopurinol (ZYLOPRIM) 300 MG tablet TAKE 1 TABLET BY MOUTH DAILY 90 tablet 1  . amLODipine (NORVASC) 10 MG tablet TAKE 1 TABLET BY MOUTH ONCE A DAY *NEED OFFICE VISIT 30 tablet 0  . aspirin 81 MG tablet Take 81 mg by mouth daily.    Marland Kitchen  atorvastatin (LIPITOR) 20 MG tablet TAKE 1 TABLET BY MOUTH DAILY 90 tablet 0  . cetirizine (ZYRTEC) 10 MG tablet Take 1 tablet (10 mg total) by mouth daily. 14 tablet 0  . cyclobenzaprine (FLEXERIL) 10 MG tablet Take 1 tablet (10 mg total) by mouth 3 (three) times daily as needed for muscle spasms. 20 tablet 0  . famotidine (PEPCID) 20 MG tablet Take 20 mg by mouth at bedtime.     . fexofenadine (ALLEGRA) 180 MG tablet Take 180 mg by mouth daily.     Marland Kitchen FLUoxetine (PROZAC) 20 MG capsule Take 1 capsule (20 mg total) by mouth daily. 15 capsule 0  . furosemide (LASIX) 20 MG tablet TAKE 1 TABLET BY MOUTH ONCE A DAY *NEED OFFICE VISIT 30  tablet 0  . guaiFENesin (MUCINEX) 600 MG 12 hr tablet Take 1 tablet (600 mg total) by mouth 2 (two) times daily as needed for cough or to loosen phlegm. 14 tablet 0  . irbesartan (AVAPRO) 300 MG tablet Take 1 tablet (300 mg total) by mouth daily.    . Probiotic Product (PROBIOTIC PO) Take 1 tablet by mouth daily.    Marland Kitchen QVAR REDIHALER 80 MCG/ACT inhaler USE ONE PUFF TWICE A DAY TO PREVENT COUGH OR WHEEZING. RINSE, GARGLE, AND SPIT AFTER USE 10.6 g 0  . sodium chloride (OCEAN) 0.65 % SOLN nasal spray Place 1 spray into both nostrils as needed for congestion. 15 mL 0   No current facility-administered medications for this visit.     Allergies  Allergen Reactions  . Bee Venom Anaphylaxis  . Hornet Venom Anaphylaxis  . Lisinopril Cough    Other reaction(s): Cough Other reaction(s): Cough  . Codeine Diarrhea, Nausea And Vomiting and Rash    Family History  Problem Relation Age of Onset  . Hypertension Father   . Colon polyps Father   . Diabetes Maternal Grandmother   . Depression Neg Hx   . Drug abuse Neg Hx   . Stroke Neg Hx   . Colon cancer Neg Hx   . Stomach cancer Neg Hx   . Esophageal cancer Neg Hx   . Rectal cancer Neg Hx     Social History   Socioeconomic History  . Marital status: Married    Spouse name: Not on file  . Number of children: Not on file  . Years of education: Not on file  . Highest education level: Not on file  Occupational History  . Not on file  Social Needs  . Financial resource strain: Not on file  . Food insecurity:    Worry: Not on file    Inability: Not on file  . Transportation needs:    Medical: Not on file    Non-medical: Not on file  Tobacco Use  . Smoking status: Never Smoker  . Smokeless tobacco: Former Network engineer and Sexual Activity  . Alcohol use: No  . Drug use: No  . Sexual activity: Not on file  Lifestyle  . Physical activity:    Days per week: Not on file    Minutes per session: Not on file  . Stress: Not on file   Relationships  . Social connections:    Talks on phone: Not on file    Gets together: Not on file    Attends religious service: Not on file    Active member of club or organization: Not on file    Attends meetings of clubs or organizations: Not on file  Relationship status: Not on file  . Intimate partner violence:    Fear of current or ex partner: Not on file    Emotionally abused: Not on file    Physically abused: Not on file    Forced sexual activity: Not on file  Other Topics Concern  . Not on file  Social History Narrative  . Not on file       Objective:   Physical Exam   BP 128/64 (BP Location: Left Arm, Patient Position: Sitting, Cuff Size: Normal)   Pulse 86   Temp 98.3 F (36.8 C) (Oral)   Ht 5\' 11"  (1.803 m)   Wt 282 lb 9.6 oz (128.2 kg)   SpO2 94%   BMI 39.41 kg/m  Wt Readings from Last 3 Encounters:  02/21/18 282 lb 9.6 oz (128.2 kg)  01/28/18 289 lb (131.1 kg)  06/30/17 (!) 317 lb (143.8 kg)   General:  pleasant male in no acute distress Eyes:  PERRL Ears:  External ear exam shows no significant lesions or deformities.  TMs normal bilaterally Hearing is grossly normal bilaterally. Nose:  External nasal examination shows no deformity or inflammation. Nasal mucosa are pink and moist without lesions or exudates. Mouth:  Oral mucosa and oropharynx without lesions or exudates.  Teeth in good repair. Neck:  no carotid bruit or thyromegaly no cervical or supraclavicular lymphadenopathy  Lungs:  Normal respiratory effort, chest expands symmetrically. Lungs are clear to auscultation, no crackles or wheezes. Heart:  Normal rate and regular rhythm. S1 and S2 normal without gallop, murmur, click, rub or other extra sounds. Abdomen:  Bowel sounds positive,abdomen soft and non-tender without masses, organomegaly or hernias noted. Pulses:  R and L posterior tibial pulses are full and equal bilaterally  Extremities:  no edema  Psych:  Good eye contact, not anxious  or depressed appearing      Assessment & Plan:

## 2018-02-21 ENCOUNTER — Ambulatory Visit (INDEPENDENT_AMBULATORY_CARE_PROVIDER_SITE_OTHER): Payer: Managed Care, Other (non HMO) | Admitting: Family Medicine

## 2018-02-21 ENCOUNTER — Encounter: Payer: Self-pay | Admitting: Family Medicine

## 2018-02-21 VITALS — BP 128/64 | HR 86 | Temp 98.3°F | Ht 71.0 in | Wt 282.6 lb

## 2018-02-21 DIAGNOSIS — N183 Chronic kidney disease, stage 3 unspecified: Secondary | ICD-10-CM

## 2018-02-21 DIAGNOSIS — Z125 Encounter for screening for malignant neoplasm of prostate: Secondary | ICD-10-CM

## 2018-02-21 DIAGNOSIS — Z1159 Encounter for screening for other viral diseases: Secondary | ICD-10-CM

## 2018-02-21 DIAGNOSIS — Z9189 Other specified personal risk factors, not elsewhere classified: Secondary | ICD-10-CM

## 2018-02-21 DIAGNOSIS — E785 Hyperlipidemia, unspecified: Secondary | ICD-10-CM | POA: Diagnosis not present

## 2018-02-21 DIAGNOSIS — J45991 Cough variant asthma: Secondary | ICD-10-CM

## 2018-02-21 DIAGNOSIS — J42 Unspecified chronic bronchitis: Secondary | ICD-10-CM | POA: Diagnosis not present

## 2018-02-21 DIAGNOSIS — Z0000 Encounter for general adult medical examination without abnormal findings: Secondary | ICD-10-CM

## 2018-02-21 DIAGNOSIS — N028 Recurrent and persistent hematuria with other morphologic changes: Secondary | ICD-10-CM

## 2018-02-21 DIAGNOSIS — E119 Type 2 diabetes mellitus without complications: Secondary | ICD-10-CM

## 2018-02-21 DIAGNOSIS — I10 Essential (primary) hypertension: Secondary | ICD-10-CM

## 2018-02-21 DIAGNOSIS — N02B9 Other recurrent and persistent immunoglobulin A nephropathy: Secondary | ICD-10-CM

## 2018-02-21 NOTE — Assessment & Plan Note (Signed)
Has been stable

## 2018-02-21 NOTE — Patient Instructions (Addendum)
Great to see you.  I am so sorry you've had such a tough couple of years.  I am thinking about you and your family.    Please keep an eye blood pressure.  Check it regularly.   Please call to schedule your Colonoscopy at [phone] with Dr. Hilarie Fredrickson :)

## 2018-02-21 NOTE — Assessment & Plan Note (Signed)
Conservatively managed so far. Doing well.

## 2018-02-21 NOTE — Assessment & Plan Note (Signed)
Diet controlled. No changes made today.

## 2018-02-21 NOTE — Assessment & Plan Note (Signed)
Well controlled, especially now weight loss. Advised to check BP at home regularly and let me and nephrologist know if he starts getting dizzy from a seated position.

## 2018-02-21 NOTE — Assessment & Plan Note (Signed)
Reviewed preventive care protocols, scheduled due services, and updated immunizations Discussed nutrition, exercise, diet, and healthy lifestyle.  

## 2018-02-22 LAB — CBC WITH DIFFERENTIAL/PLATELET
Basophils Absolute: 0.2 10*3/uL — ABNORMAL HIGH (ref 0.0–0.1)
Basophils Relative: 2.4 % (ref 0.0–3.0)
Eosinophils Absolute: 0.4 10*3/uL (ref 0.0–0.7)
Eosinophils Relative: 4.8 % (ref 0.0–5.0)
HCT: 38.7 % — ABNORMAL LOW (ref 39.0–52.0)
Hemoglobin: 12.9 g/dL — ABNORMAL LOW (ref 13.0–17.0)
Lymphocytes Relative: 19.7 % (ref 12.0–46.0)
Lymphs Abs: 1.7 10*3/uL (ref 0.7–4.0)
MCHC: 33.3 g/dL (ref 30.0–36.0)
MCV: 94 fl (ref 78.0–100.0)
Monocytes Absolute: 0.6 10*3/uL (ref 0.1–1.0)
Monocytes Relative: 7.2 % (ref 3.0–12.0)
Neutro Abs: 5.6 10*3/uL (ref 1.4–7.7)
Neutrophils Relative %: 65.9 % (ref 43.0–77.0)
Platelets: 234 10*3/uL (ref 150.0–400.0)
RBC: 4.12 Mil/uL — ABNORMAL LOW (ref 4.22–5.81)
RDW: 14.2 % (ref 11.5–15.5)
WBC: 8.5 10*3/uL (ref 4.0–10.5)

## 2018-02-22 LAB — LIPID PANEL
Cholesterol: 161 mg/dL (ref 0–200)
HDL: 34.3 mg/dL — ABNORMAL LOW (ref >39.00)
NonHDL: 126.74
Total CHOL/HDL Ratio: 5
Triglycerides: 269 mg/dL — ABNORMAL HIGH (ref 0.0–149.0)
VLDL: 53.8 mg/dL — ABNORMAL HIGH (ref 0.0–40.0)

## 2018-02-22 LAB — LDL CHOLESTEROL, DIRECT: Direct LDL: 91 mg/dL

## 2018-02-22 LAB — HEPATITIS C ANTIBODY
Hepatitis C Ab: NONREACTIVE
SIGNAL TO CUT-OFF: 0.03 (ref <1.00)

## 2018-02-22 LAB — PSA: PSA: 0.47 ng/mL (ref 0.10–4.00)

## 2018-02-27 ENCOUNTER — Other Ambulatory Visit: Payer: Self-pay | Admitting: Family Medicine

## 2018-05-26 ENCOUNTER — Other Ambulatory Visit: Payer: Self-pay | Admitting: Family Medicine

## 2018-05-27 MED ORDER — FLUOXETINE HCL 20 MG PO CAPS: 20.0000 mg | ORAL_CAPSULE | Freq: Every day | ORAL | 0 refills | Status: DC

## 2018-06-01 DIAGNOSIS — D235 Other benign neoplasm of skin of trunk: Secondary | ICD-10-CM

## 2018-06-07 ENCOUNTER — Other Ambulatory Visit: Payer: Self-pay | Admitting: Family Medicine

## 2018-06-14 ENCOUNTER — Encounter: Payer: Self-pay | Admitting: Internal Medicine

## 2018-09-27 ENCOUNTER — Ambulatory Visit: Payer: Managed Care, Other (non HMO) | Admitting: *Deleted

## 2018-09-27 ENCOUNTER — Other Ambulatory Visit: Payer: Self-pay

## 2018-09-27 VITALS — Ht 71.0 in | Wt 290.0 lb

## 2018-09-27 DIAGNOSIS — Z86010 Personal history of colonic polyps: Secondary | ICD-10-CM

## 2018-09-27 MED ORDER — NA SULFATE-K SULFATE-MG SULF 17.5-3.13-1.6 GM/177ML PO SOLN: ORAL | 0 refills | Status: DC

## 2018-09-27 NOTE — Progress Notes (Signed)
Patient's pre-visit was done today over the phone with the patient due to COVID-19 pandemic. Name,DOB and address verified. Insurance verified. Packet of Prep instructions mailed to patient including copy of a consent form and pre-procedure patient acknowledgement form-pt is aware. Suprep $15 Coupon included. Patient understands to call us back with any questions or concerns. Patient denies any allergies to eggs or soy. Patient denies any problems with anesthesia/sedation. Patient denies any oxygen use at home. Patient denies taking any diet/weight loss medications or blood thinners. EMMI education assisgned to patient on colonoscopy, this was explained and instructions given to patient.  Pt is aware that care partner will wait in the car during procedure; if they feel like they will be too hot to wait in the car; they may wait in the lobby.  We want them to wear a mask (we do not have any that we can provide them), practice social distancing, and we will check their temperatures when they get here.  I did remind patient that their care partner needs to stay in the parking lot the entire time. Pt will wear mask into building.

## 2018-10-10 ENCOUNTER — Telehealth: Payer: Self-pay | Admitting: Internal Medicine

## 2018-10-10 NOTE — Telephone Encounter (Signed)

## 2018-10-11 ENCOUNTER — Ambulatory Visit (AMBULATORY_SURGERY_CENTER): Payer: Managed Care, Other (non HMO) | Admitting: Internal Medicine

## 2018-10-11 ENCOUNTER — Encounter: Payer: Self-pay | Admitting: Internal Medicine

## 2018-10-11 ENCOUNTER — Other Ambulatory Visit: Payer: Self-pay

## 2018-10-11 VITALS — BP 110/80 | HR 73 | Temp 97.8°F | Resp 18 | Ht 71.0 in | Wt 282.0 lb

## 2018-10-11 DIAGNOSIS — Z86010 Personal history of colonic polyps: Secondary | ICD-10-CM

## 2018-10-11 DIAGNOSIS — D120 Benign neoplasm of cecum: Secondary | ICD-10-CM

## 2018-10-11 DIAGNOSIS — D122 Benign neoplasm of ascending colon: Secondary | ICD-10-CM

## 2018-10-11 DIAGNOSIS — D124 Benign neoplasm of descending colon: Secondary | ICD-10-CM

## 2018-10-11 MED ORDER — SODIUM CHLORIDE 0.9 % IV SOLN: 500.0000 mL | INTRAVENOUS | Status: DC

## 2018-10-11 NOTE — Op Note (Signed)
[place] Patient Name: Richard Giles Procedure Date: 10/11/2018 10:12 AM MRN: [id number] Endoscopist: Jerene Bears , MD Age: 58 Referring MD:  Date of Birth: 11-May-1960 Gender: Male Account #: [id number] Procedure:                Colonoscopy Indications:              High risk colon cancer surveillance: Personal                            history of non-advanced adenoma, Personal history                            of sessile serrated colon polyp (less than 10 mm in                            size) with no dysplasia, Last colonoscopy: November                            2016 Medicines:                Monitored Anesthesia Care Procedure:                Pre-Anesthesia Assessment:                           - Prior to the procedure, a History and Physical                            was performed, and patient medications and                            allergies were reviewed. The patient's tolerance of                            previous anesthesia was also reviewed. The risks                            and benefits of the procedure and the sedation                            options and risks were discussed with the patient.                            All questions were answered, and informed consent                            was obtained. Prior Anticoagulants: The patient has                            taken no previous anticoagulant or antiplatelet                            agents. ASA Grade Assessment: III - A patient with  severe systemic disease. After reviewing the risks                            and benefits, the patient was deemed in                            satisfactory condition to undergo the procedure.                           After obtaining informed consent, the colonoscope                            was passed under direct vision. Throughout the                            procedure, the patient's blood pressure, pulse, and                     oxygen saturations were monitored continuously. The                            Colonoscope was introduced through the anus and                            advanced to the cecum, identified by appendiceal                            orifice and ileocecal valve. The colonoscopy was                            performed without difficulty. The patient tolerated                            the procedure well. The quality of the bowel                            preparation was good. The ileocecal valve,                            appendiceal orifice, and rectum were photographed. Scope In: 10:23:13 AM Scope Out: 10:37:32 AM Scope Withdrawal Time: 0 hours 13 minutes 2 seconds  Total Procedure Duration: 0 hours 14 minutes 19 seconds  Findings:                 The digital rectal exam was normal.                           A 5 mm polyp was found in the cecum. The polyp was                            sessile. The polyp was removed with a cold snare.                            Resection and retrieval were complete.  A 4 mm polyp was found in the ascending colon. The                            polyp was sessile. The polyp was removed with a                            cold snare. Resection and retrieval were complete.                           Two sessile polyps were found in the descending                            colon. The polyps were 3 to 4 mm in size. These                            polyps were removed with a cold snare. Resection                            and retrieval were complete.                           Internal hemorrhoids were found during                            retroflexion. The hemorrhoids were small. Complications:            No immediate complications. Estimated Blood Loss:     Estimated blood loss was minimal. Impression:               - One 5 mm polyp in the cecum, removed with a cold                            snare. Resected and  retrieved.                           - One 4 mm polyp in the ascending colon, removed                            with a cold snare. Resected and retrieved.                           - Two 3 to 4 mm polyps in the descending colon,                            removed with a cold snare. Resected and retrieved.                           - Small internal hemorrhoids. Recommendation:           - Patient has a contact number available for                            emergencies. The signs and symptoms of potential  delayed complications were discussed with the                            patient. Return to normal activities tomorrow.                            Written discharge instructions were provided to the                            patient.                           - Resume previous diet.                           - Continue present medications.                           - Await pathology results.                           - Repeat colonoscopy is recommended for                            surveillance. The colonoscopy date will be                            determined after pathology results from today's                            exam become available for review. Jerene Bears, MD 10/11/2018 10:43:14 AM This report has been signed electronically.

## 2018-10-11 NOTE — Progress Notes (Signed)
Step temp. Nancy Vital signs. Liyla Radliff Checked in. Suz IV.Pt's states no medical or surgical changes since previsit or office visit.

## 2018-10-11 NOTE — Progress Notes (Signed)
Report to PACU, RN, vss, BBS= Clear.  

## 2018-10-11 NOTE — Patient Instructions (Signed)
Handouts given for hemorrhoids and polyps.  YOU HAD AN ENDOSCOPIC PROCEDURE TODAY AT THE [facility]:   Refer to the procedure report that was given to you for any specific questions about what was found during the examination.  If the procedure report does not answer your questions, please call your gastroenterologist to clarify.  If you requested that your care partner not be given the details of your procedure findings, then the procedure report has been included in a sealed envelope for you to review at your convenience later.  YOU SHOULD EXPECT: Some feelings of bloating in the abdomen. Passage of more gas than usual.  Walking can help get rid of the air that was put into your GI tract during the procedure and reduce the bloating. If you had a lower endoscopy (such as a colonoscopy or flexible sigmoidoscopy) you may notice spotting of blood in your stool or on the toilet paper. If you underwent a bowel prep for your procedure, you may not have a normal bowel movement for a few days.  Please Note:  You might notice some irritation and congestion in your nose or some drainage.  This is from the oxygen used during your procedure.  There is no need for concern and it should clear up in a day or so.  SYMPTOMS TO REPORT IMMEDIATELY:   Following lower endoscopy (colonoscopy or flexible sigmoidoscopy):  Excessive amounts of blood in the stool  Significant tenderness or worsening of abdominal pains  Swelling of the abdomen that is new, acute  Fever of 100F or higher   For urgent or emergent issues, a gastroenterologist can be reached at any hour by calling [phone].   DIET:  We do recommend a small meal at first, but then you may proceed to your regular diet.  Drink plenty of fluids but you should avoid alcoholic beverages for 24 hours.  ACTIVITY:  You should plan to take it easy for the rest of today and you should NOT DRIVE or use heavy machinery until tomorrow (because of  the sedation medicines used during the test).    FOLLOW UP: Our staff will call the number listed on your records 48-72 hours following your procedure to check on you and address any questions or concerns that you may have regarding the information given to you following your procedure. If we do not reach you, we will leave a message.  We will attempt to reach you two times.  During this call, we will ask if you have developed any symptoms of COVID 19. If you develop any symptoms (ie: fever, flu-like symptoms, shortness of breath, cough etc.) before then, please call [phone].  If you test positive for Covid 19 in the 2 weeks post procedure, please call and report this information to us.    If any biopsies were taken you will be contacted by phone or by letter within the next 1-3 weeks.  Please call us at [phone] if you have not heard about the biopsies in 3 weeks.    SIGNATURES/CONFIDENTIALITY: You and/or your care partner have signed paperwork which will be entered into your electronic medical record.  These signatures attest to the fact that that the information above on your After Visit Summary has been reviewed and is understood.  Full responsibility of the confidentiality of this discharge information lies with you and/or your care-partner. 

## 2018-10-11 NOTE — Progress Notes (Signed)
Called to room to assist during endoscopic procedure.  Patient ID and intended procedure confirmed with present staff. Received instructions for my participation in the procedure from the performing physician.  

## 2018-10-13 ENCOUNTER — Telehealth: Payer: Self-pay

## 2018-10-13 ENCOUNTER — Telehealth: Payer: Self-pay | Admitting: *Deleted

## 2018-10-13 NOTE — Telephone Encounter (Signed)
  Follow up Call-  Call back number 10/11/2018  Post procedure Call Back phone  # [phone]  Permission to leave phone message Yes  Some recent data might be hidden     Patient questions:  Do you have a fever, pain , or abdominal swelling? No. Pain Score  0 *  Have you tolerated food without any problems? Yes.    Have you been able to return to your normal activities? Yes.    Do you have any questions about your discharge instructions: Diet   No. Medications  No. Follow up visit  No.  Do you have questions or concerns about your Care? No.  Actions: * If pain score is 4 or above: No action needed, pain <4. 1. Have you developed a fever since your procedure? no  2.   Have you had an respiratory symptoms (SOB or cough) since your procedure? no  3.   Have you tested positive for COVID 19 since your procedure no  4.   Have you had any family members/close contacts diagnosed with the COVID 19 since your procedure?  no   If yes to any of these questions please route to Joylene Rashaud, RN and Alphonsa Gin, Therapist, sports.

## 2018-10-13 NOTE — Telephone Encounter (Signed)
Left message for pt to call if any questions or problems, otherwise we will call back after noon today.

## 2018-10-20 ENCOUNTER — Encounter: Payer: Self-pay | Admitting: Internal Medicine

## 2018-10-20 ENCOUNTER — Other Ambulatory Visit: Payer: Self-pay | Admitting: Family Medicine

## 2018-10-20 MED ORDER — ATORVASTATIN CALCIUM 20 MG PO TABS: 20.0000 mg | ORAL_TABLET | Freq: Every day | ORAL | 0 refills | Status: DC

## 2018-12-07 ENCOUNTER — Other Ambulatory Visit: Payer: Self-pay | Admitting: Family Medicine

## 2018-12-13 ENCOUNTER — Ambulatory Visit (INDEPENDENT_AMBULATORY_CARE_PROVIDER_SITE_OTHER): Payer: Managed Care, Other (non HMO)

## 2018-12-13 ENCOUNTER — Other Ambulatory Visit: Payer: Self-pay | Admitting: Family Medicine

## 2018-12-13 DIAGNOSIS — Z23 Encounter for immunization: Secondary | ICD-10-CM | POA: Diagnosis not present

## 2018-12-15 ENCOUNTER — Ambulatory Visit: Payer: Managed Care, Other (non HMO)

## 2019-02-14 NOTE — Assessment & Plan Note (Signed)
History: Review: taking medications as instructed, no medication side effects noted, no TIAs, no chest pain on exertion, no dyspnea on exertion, no swelling of ankles. Smoker: No.  BP Readings from Last 3 Encounters:  10/11/18 110/80  02/21/18 128/64  01/28/18 132/76   Lab Results  Component Value Date   CREATININE 2.0 (A) 02/10/2018     Assessment/Plan: 1. Medication: continue Continue irbesartan 300mg  QD, amlodipine 10mg  QD, Lasix 20mg  QD, carvedilol 3.125mg  BID . 2. Dietary sodium restriction. 3. Regular aerobic exercise. 4. Check blood pressures a few times times monthly and record.

## 2019-02-14 NOTE — Assessment & Plan Note (Signed)
Followed by Dr. Percell Miller at [facility] who he last saw on 08/13/18. biopsy 04/2014, has been managed conservatively with ARB (cough with ACEi), titrated to max dose. Referred here in 08/2014 for second opinion regarding management, as he had consistently been having ~1-2g proteinuria. He has some renal insufficiency, with Cr ranging mid-upper 1s. Given his weight and history of elevated A1C, if immunosuppressive therapy were needed (UPC >1-1.5), would likely opt for modified Ballardie over Pozzi to minimize steroid exposure. However, since being seen here (with no further intervention besides ARB started by his nephrologist), UPC has been <0.3, so no immunosuppressive therapy warranted at this time, will continue to manage conservatively.  - Continue irbesartan 300mg  QD    - Continue irbesartan 300mg  QD

## 2019-02-14 NOTE — Assessment & Plan Note (Addendum)
Renal decreased allopurinol from 300 to 150 given reduced GFR but he was getting increased frequency of flares. Continue allopurinol, increased from 150mg  to 200mg  QD- Check uric acid today Has not any flares.

## 2019-02-14 NOTE — Progress Notes (Signed)
Virtual Visit via Video   Due to the COVID-19 pandemic, this visit was completed with telemedicine (audio/video) technology to reduce patient and provider exposure as well as to preserve personal protective equipment.   I connected with Richard Giles by a video enabled telemedicine application and verified that I am speaking with the correct person using two identifiers. Location patient: Home Location provider: [facility], Office Persons participating in the virtual visit: LEOTIS ISHAM, Arnette Norris, MD   I discussed the limitations of evaluation and management by telemedicine and the availability of in person appointments. The patient expressed understanding and agreed to proceed.  Care Team   Patient Care Team: Lucille Passy, MD as PCP - General (Family Medicine) Rexene Agent, MD as Attending Physician (Nephrology)  Subjective:   HPI:     Health Maintenance  Topic Date Due  . FOOT EXAM  12/16/2016  . OPHTHALMOLOGY EXAM  08/08/2018  . HEMOGLOBIN A1C  08/12/2018  . TETANUS/TDAP  06/24/2021  . COLONOSCOPY  10/11/2023  . INFLUENZA VACCINE  Completed  . PNEUMOCOCCAL POLYSACCHARIDE VACCINE AGE 51-64 HIGH RISK  Completed  . Hepatitis C Screening  Completed  . HIV Screening  Discontinued   Colonoscopy 10/11/18- reviewed- Dr. Hilarie Fredrickson.  Polyps were found.  He left his job and took early retirement, has been home since 3/1. He has been enjoying being at home and is staying busy. His wife had some complications from her cancer, including an infection in her leg resulting in removal of part of her femur. She is learning to walk again.   She is doing much better.     Chronic bronchitis- has been followed by pulmonary.   HTN-  BP had been well controlled on Irebesartan 300 mg daily, amlodipine 10 mg, coreg 3.125 mg twice daily and lasix 20 mg daily.    DM-was diet controlled but had to restart Metformin 1000 mg twice daily when he gained weight back in 8/20..  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 Followed by [facility] nephrology. Has been managed conservatively with ARB (allergic to ACEI). Last seen by Dr. Percell Miller via virtual visit on 08/04/18.  Note reviewed.     Her assessment and plan was as follows: "1. IgA nephropathy: biopsy 04/2014, has been managed conservatively with ARB (cough with ACEi), titrated to max dose. Referred here in 08/2014 for second opinion regarding management, as he had consistently been having ~1-2g proteinuria. He has some renal insufficiency, with Cr ranging mid-upper 1s. Given his weight and history of elevated A1C, if immunosuppressive therapy were needed (UPC >1-1.5), would likely opt for modified Ballardie over Pozzi to minimize steroid exposure. However, since being seen here (with no further intervention besides ARB started by his nephrologist), UPC has been <0.3, so no immunosuppressive therapy warranted at this time, will continue to manage conservatively.  - Check BMP, UPC - will have him get labs done in [place] in the next week or so as this is much closer for him - Continue irbesartan 398m QD   2. HTN: well-controlled on home readings - Continue irbesartan 3067mQD, amlodipine 1021mD, Lasix 69m77m, carvedilol 3.125mg31m  - Continue to monitor BP at home  3. CKD  IIIB: 2/2 IgA nephropathy, recent baseline Cr ~1.7-1.8, corresponding to eGFR in low 40s.  - PTH 130 in 01/2018; recheck next visit - Ca and phos wnl, recheck with BMP today - Hgb 13.6 in 01/2017 - On Atorvastatin 49m QD, continue  4. Gout: Decreased allopurinol from 300 to 150 given reduced GFR, he reports today that on the lower dose he has had a couple of flares.  - Continue allopurinol, increase from 1555mto 20021mD, see if this  helps with frequency of flares - Check uric acid today  5. DM: Most recent HgbA1C 6.1, down from 7.4. On Metformin - also helpful for weight loss.  - Recheck A1C today - can decrease dose if A1C down " (a1c was 6.3 in her office that day).  Lab Results  Component Value Date   CREATININE 2.0 (A) 02/10/2018     Lab Results  Component Value Date   CHOL 161 02/21/2018   HDL 34.30 (L) 02/21/2018   LDLCALC 178 (H) 07/03/2013   LDLDIRECT 91.0 02/21/2018   TRIG 269.0 (H) 02/21/2018   CHOLHDL 5 02/21/2018     Review of Systems  Constitutional: Positive for malaise/fatigue. Negative for fever.  HENT: Negative.  Negative for congestion and hearing loss.   Eyes: Negative.  Negative for blurred vision, discharge and redness.  Respiratory: Negative.  Negative for cough and shortness of breath.   Cardiovascular: Negative.  Negative for chest pain, palpitations and leg swelling.  Gastrointestinal: Negative.  Negative for abdominal pain and heartburn.  Genitourinary: Negative.  Negative for dysuria.  Musculoskeletal: Negative.  Negative for falls.  Skin: Negative.  Negative for rash.  Neurological: Negative.  Negative for loss of consciousness and headaches.  Endo/Heme/Allergies: Negative.  Does not bruise/bleed easily.  Psychiatric/Behavioral: Negative.  Negative for depression.  All other systems reviewed and are negative.    Patient Active Problem List   Diagnosis Date Noted  . DM type 2 (diabetes mellitus, type 2) ([place]CC[place]1/08/2012    Priority: High  . Obesity, unspecified 12/30/2012    Priority: High  . GOUT 03/07/2008    Priority: High  . Fatigue 02/15/2019  . CKD (chronic kidney disease), stage III 02/20/2018  . Dysplastic nevus of trunk 02/04/2018    Class: History of  . Chronic bronchitis ([place] . RLS (restless legs syndrome) 12/17/2015  . Morbid obesity (HCC[place]0/24/2017  . Visit for well man health check 12/05/2015  . IgA nephropathy determined by biopsy of kidney  07/16/2014  . 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 Essential hypertension 07/11/2014  . Proteinuria   . Hyperlipidemia 07/06/2011  . SLEEP DISORDER 03/07/2008  . HEMORRHOIDS, HX OF 03/07/2008  . ALLERGY, ENVIRONMENTAL 07/25/2007    Social History   Tobacco Use  . Smoking status: Never Smoker  . Smokeless tobacco: Former UseNetwork engineere Topics  . Alcohol use: No    Current Outpatient Medications:  .  acetaminophen (TYLENOL) 500 MG tablet, Take 1,000 mg by mouth every 6 (six) hours as needed (pain). , Disp: , Rfl:  .  albuterol (PROVENTIL HFA;VENTOLIN HFA) 108 (90 Base) MCG/ACT inhaler, Inhale 2 puffs into the lungs every 4 (four) hours as needed for wheezing or shortness of breath., Disp: 1 Inhaler, Rfl: 0 .  allopurinol (ZYLOPRIM) 300 MG tablet, Take 1 tablet (300 mg total) by mouth daily., Disp: 90 tablet, Rfl: 1 .  amLODipine (NORVASC) 10 MG tablet, TAKE 1 TABLET BY MOUTH ONCE A DAY *NEED OFFICE VISIT,  Disp: 30 tablet, Rfl: 0 .  aspirin 81 MG tablet, Take 81 mg by mouth daily., Disp: , Rfl:  .  atorvastatin (LIPITOR) 20 MG tablet, Take 1 tablet (20 mg total) by mouth daily., Disp: 90 tablet, Rfl: 0 .  carvedilol (COREG) 3.125 MG tablet, TAKE 1 TABLET (3.125 MG TOTAL) BY MOUTH TWO (2) TIMES A DAY., Disp: , Rfl:  .  cetirizine (ZYRTEC) 10 MG tablet, Take 1 tablet (10 mg total) by mouth daily., Disp: 14 tablet, Rfl: 0 .  cyclobenzaprine (FLEXERIL) 10 MG tablet, Take 1 tablet (10 mg total) by mouth 3 (three) times daily as needed for muscle spasms., Disp: 20 tablet, Rfl: 0 .  famotidine (PEPCID) 20 MG tablet, Take 20 mg by mouth at bedtime. , Disp: , Rfl:  .  fexofenadine (ALLEGRA) 180 MG tablet, Take 180 mg by mouth daily. , Disp: , Rfl:  .  FLUoxetine (PROZAC) 20 MG capsule, TAKE 1 CAPSULE BY MOUTH EVERY DAY, Disp: 90 capsule, Rfl: 1 .  furosemide (LASIX) 20 MG tablet, TAKE 1 TABLET BY MOUTH ONCE A DAY *NEED OFFICE VISIT, Disp: 30 tablet, Rfl: 0 .  irbesartan  (AVAPRO) 300 MG tablet, Take 1 tablet (300 mg total) by mouth daily., Disp: , Rfl:  .  metFORMIN (GLUCOPHAGE) 500 MG tablet, Take 2 tablets by mouth 2 (two) times daily., Disp: , Rfl:  .  Probiotic Product (PROBIOTIC PO), Take 1 tablet by mouth daily., Disp: , Rfl:  .  QVAR REDIHALER 80 MCG/ACT inhaler, USE ONE PUFF TWICE A DAY TO PREVENT COUGH OR WHEEZING. RINSE, GARGLE, AND SPIT AFTER USE, Disp: 10.6 g, Rfl: 0 .  guaiFENesin (MUCINEX) 600 MG 12 hr tablet, Take 1 tablet (600 mg total) by mouth 2 (two) times daily as needed for cough or to loosen phlegm. (Patient not taking: Reported on 02/15/2019), Disp: 14 tablet, Rfl: 0 .  sodium chloride (OCEAN) 0.65 % SOLN nasal spray, Place 1 spray into both nostrils as needed for congestion. (Patient not taking: Reported on 02/15/2019), Disp: 15 mL, Rfl: 0  Allergies  Allergen Reactions  . Bee Venom Anaphylaxis  . Hornet Venom Anaphylaxis  . Lisinopril Cough    Other reaction(s): Cough Other reaction(s): Cough  . Codeine Diarrhea, Nausea And Vomiting and Rash    Objective:  BP 130/80   Temp 98.4 F (36.9 C) (Temporal)   Ht 5' 11" (1.803 m)   Wt 290 lb (131.5 kg)   BMI 40.45 kg/m  Wt Readings from Last 3 Encounters:  02/15/19 290 lb (131.5 kg)  10/11/18 282 lb (127.9 kg)  09/27/18 290 lb (131.5 kg)    VITALS: Per patient if applicable, see vitals. GENERAL: Alert, appears well and in no acute distress. HEENT: Atraumatic, conjunctiva clear, no obvious abnormalities on inspection of external nose and ears. NECK: Normal movements of the head and neck. CARDIOPULMONARY: No increased WOB. Speaking in clear sentences. I:E ratio WNL.  MS: Moves all visible extremities without noticeable abnormality. PSYCH: Pleasant and cooperative, well-groomed. Speech normal rate and rhythm. Affect is appropriate. Insight and judgement are appropriate. Attention is focused, linear, and appropriate.  NEURO: CN grossly intact. Oriented as arrived to appointment on  time with no prompting. Moves both UE equally.  SKIN: No obvious lesions, wounds, erythema, or cyanosis noted on face or hands.  Depression screen PHQ 2/9 02/21/2018  Decreased Interest 0  Down, Depressed, Hopeless 0  PHQ - 2 Score 0     . COVID-19 Education: The signs and symptoms of  COVID-19 were discussed with the patient and how to seek care for testing if needed. The importance of social distancing was discussed today. . Reviewed expectations re: course of current medical issues. . Discussed self-management of symptoms. . Outlined signs and symptoms indicating need for more acute intervention. . Patient verbalized understanding and all questions were answered. Marland Kitchen Health Maintenance issues including appropriate healthy diet, exercise, and smoking avoidance were discussed with patient. . See orders for this visit as documented in the electronic medical record.  Arnette Norris, MD    Lab Results  Component Value Date   WBC 8.5 02/21/2018   HGB 12.9 (L) 02/21/2018   HCT 38.7 (L) 02/21/2018   PLT 234.0 02/21/2018   GLUCOSE 132 (H) 12/13/2015   CHOL 161 02/21/2018   TRIG 269.0 (H) 02/21/2018   HDL 34.30 (L) 02/21/2018   LDLDIRECT 91.0 02/21/2018   LDLCALC 178 (H) 07/03/2013   ALT 21 12/13/2015   AST 18 12/13/2015   NA 138 02/10/2018   K 5.2 02/10/2018   CL 102 12/13/2015   CREATININE 2.0 (A) 02/10/2018   BUN 30 (A) 02/10/2018   CO2 29 12/13/2015   PSA 0.47 02/21/2018   INR 0.93 05/03/2014   HGBA1C 6.1 02/10/2018   MICROALBUR 104.8 02/10/2018    No results found for: TSH Lab Results  Component Value Date   WBC 8.5 02/21/2018   HGB 12.9 (L) 02/21/2018   HCT 38.7 (L) 02/21/2018   MCV 94.0 02/21/2018   PLT 234.0 02/21/2018   Lab Results  Component Value Date   NA 138 02/10/2018   K 5.2 02/10/2018   CO2 29 12/13/2015   GLUCOSE 132 (H) 12/13/2015   BUN 30 (A) 02/10/2018   CREATININE 2.0 (A) 02/10/2018   BILITOT 0.5 12/13/2015   ALKPHOS 106 12/13/2015   AST 18  12/13/2015   ALT 21 12/13/2015   PROT 6.8 12/13/2015   ALBUMIN 4.1 12/13/2015   CALCIUM 9.8 12/13/2015   GFR 38.39 (L) 12/13/2015   Lab Results  Component Value Date   CHOL 161 02/21/2018   Lab Results  Component Value Date   HDL 34.30 (L) 02/21/2018   Lab Results  Component Value Date   LDLCALC 178 (H) 07/03/2013   Lab Results  Component Value Date   TRIG 269.0 (H) 02/21/2018   Lab Results  Component Value Date   CHOLHDL 5 02/21/2018   Lab Results  Component Value Date   HGBA1C 6.1 02/10/2018       Assessment & Plan:   Problem List Items Addressed This Visit      Active Problems   IgA nephropathy determined by biopsy of kidney (Chronic)    Followed by Dr. Percell Miller at [facility] who he last saw on 08/13/18. biopsy 04/2014, has been managed conservatively with ARB (cough with ACEi), titrated to max dose. Referred here in 08/2014 for second opinion regarding management, as he had consistently been having ~1-2g proteinuria. He has some renal insufficiency, with Cr ranging mid-upper 1s. Given his weight and history of elevated A1C, if immunosuppressive therapy were needed (UPC >1-1.5), would likely opt for modified Ballardie over Pozzi to minimize steroid exposure. However, since being seen here (with no further intervention besides ARB started by his nephrologist), UPC has been <0.3, so no immunosuppressive therapy warranted at this time, will continue to manage conservatively.  - Continue irbesartan 339m QD    - Continue irbesartan 3048mQD        Relevant Orders   Comprehensive metabolic panel  Iron, TIBC and Ferritin Panel   GOUT    Renal decreased allopurinol from 300 to 150 given reduced GFR but he was getting increased frequency of flares. Continue allopurinol, increased from 145m to 2097mQD- Check uric acid today Has not any flares.      Relevant Orders   Uric acid   DM type 2 (diabetes mellitus, type 2) (HCC)    Medication compliance: compliant most of the  time, diabetic diet compliance: compliant most of the time, home glucose monitoring:- not regularly, further diabetic ROS: no polyuria or polydipsia, no chest pain, dyspnea or TIA's, no numbness, tingling or pain in extremities. Lab Results  Component Value Date   HGBA1C 6.1 02/10/2018   HGBA1C 6.4 12/13/2015   HGBA1C 6.1 07/11/2014   Lab Results  Component Value Date   MICROALBUR 104.8 02/10/2018   LDLCALC 178 (H) 07/03/2013   CREATININE 2.0 (A) 02/10/2018    Health Maintenance 1.  Patient is counseled on appropriate foot care. 2.  BP goal < 130/80. 3.  LDL goal of < 100, HDL > 40 and TG < 150. All diabetic should be on a statin unless contraindication. 4.  Eye Exam yearly and Dental Exam every 6 months. 5.  Dietary recommendations: < 100 g carbohydrates daily. 6.  Physical Activity recommendations: As tolerated aerobic and strength exercises.  7.  Appropriate vaccines reviewed.         Relevant Orders   DM- a1c   Obesity, unspecified   Hyperlipidemia    Has been well controlled on current regimen. LDL has been at at goal on current statin dose, patient has no side effects from medication and LFTS have been normal.  Due for repeat labs today. Lab Results  Component Value Date   CHOL 161 02/21/2018   HDL 34.30 (L) 02/21/2018   LDLCALC 178 (H) 07/03/2013   LDLDIRECT 91.0 02/21/2018   TRIG 269.0 (H) 02/21/2018   CHOLHDL 5 02/21/2018   Lab Results  Component Value Date   ALT 21 12/13/2015   AST 18 12/13/2015   ALKPHOS 106 12/13/2015   BILITOT 0.5 12/13/2015         Relevant Medications   amLODipine (NORVASC) 10 MG tablet   Other Relevant Orders   Lipid panel   TSH   CBC with Differential/Platelet   Essential hypertension    History: Review: taking medications as instructed, no medication side effects noted, no TIAs, no chest pain on exertion, no dyspnea on exertion, no swelling of ankles. Smoker: No.  BP Readings from Last 3 Encounters:  10/11/18 110/80    02/21/18 128/64  01/28/18 132/76   Lab Results  Component Value Date   CREATININE 2.0 (A) 02/10/2018     Assessment/Plan: 1. Medication: continue Continue irbesartan 30088mD, amlodipine 31m57m, Lasix 20mg4m carvedilol 3.125mg 51m. 2. Dietary sodium restriction. 3. Regular aerobic exercise. 4. Check blood pressures a few times times monthly and record.       Relevant Medications   amLODipine (NORVASC) 10 MG tablet   Visit for well man health check - Primary    Reviewed preventive care protocols, scheduled due services, and updated immunizations Discussed nutrition, exercise, diet, and healthy lifestyle.       Morbid obesity (HCC)  [place] Readings from Last 3 Encounters:  02/15/19 290 lb (131.5 kg)  10/11/18 282 lb (127.9 kg)  09/27/18 290 lb (131.5 kg)         Chronic bronchitis (HCC)   CKD (chronic  kidney disease), stage III   Fatigue    History: Symptoms began several months ago. The patient feels the fatigue began with: wife denies signs of sleep apnea.. Symptoms of his fatigue have been general malaise. Patient describes the following psychological symptoms: none. Patient denies significant change in weight. Symptom severity: symptoms bothersome, but easily able to carry out all usual work/school/family activities. Previous visits for this problem: none.   Assessment/Plan: Observation for worsening or other new symptoms such as pain, fever or weight loss, Diagnostic tests, as ordered and Focus on self care- diet, physical activity and stress reduction           I have changed Antione D. Christenson's allopurinol. I am also having him maintain his acetaminophen, famotidine, fexofenadine, Probiotic Product (PROBIOTIC PO), aspirin, furosemide, cyclobenzaprine, albuterol, sodium chloride, guaiFENesin, cetirizine, irbesartan, metFORMIN, carvedilol, atorvastatin, FLUoxetine, Qvar RediHaler, and amLODipine.  Meds ordered this encounter  Medications  . allopurinol (ZYLOPRIM)  300 MG tablet    Sig: Take 1 tablet (300 mg total) by mouth daily.    Dispense:  90 tablet    Refill:  1  . amLODipine (NORVASC) 10 MG tablet    Sig: TAKE 1 TABLET BY MOUTH ONCE A DAY *NEED OFFICE VISIT    Dispense:  30 tablet    Refill:  0    PATIENT MUST SCHEDULE APPT BEFORE ANY FURTHER REFILLS WILL BE GIVEN.     Arnette Norris, MD

## 2019-02-14 NOTE — Assessment & Plan Note (Signed)
Has been well controlled on current regimen. LDL has been at at goal on current statin dose, patient has no side effects from medication and LFTS have been normal.  Due for repeat labs today. Lab Results  Component Value Date   CHOL 161 02/21/2018   HDL 34.30 (L) 02/21/2018   LDLCALC 178 (H) 07/03/2013   LDLDIRECT 91.0 02/21/2018   TRIG 269.0 (H) 02/21/2018   CHOLHDL 5 02/21/2018   Lab Results  Component Value Date   ALT 21 12/13/2015   AST 18 12/13/2015   ALKPHOS 106 12/13/2015   BILITOT 0.5 12/13/2015

## 2019-02-14 NOTE — Assessment & Plan Note (Signed)
Medication compliance: compliant most of the time, diabetic diet compliance: compliant most of the time, home glucose monitoring:- not regularly, further diabetic ROS: no polyuria or polydipsia, no chest pain, dyspnea or TIA's, no numbness, tingling or pain in extremities. Lab Results  Component Value Date   HGBA1C 6.1 02/10/2018   HGBA1C 6.4 12/13/2015   HGBA1C 6.1 07/11/2014   Lab Results  Component Value Date   MICROALBUR 104.8 02/10/2018   LDLCALC 178 (H) 07/03/2013   CREATININE 2.0 (A) 02/10/2018    Health Maintenance 1.  Patient is counseled on appropriate foot care. 2.  BP goal < 130/80. 3.  LDL goal of < 100, HDL > 40 and TG < 150. All diabetic should be on a statin unless contraindication. 4.  Eye Exam yearly and Dental Exam every 6 months. 5.  Dietary recommendations: < 100 g carbohydrates daily. 6.  Physical Activity recommendations: As tolerated aerobic and strength exercises.  7.  Appropriate vaccines reviewed.

## 2019-02-14 NOTE — Assessment & Plan Note (Signed)
Reviewed preventive care protocols, scheduled due services, and updated immunizations Discussed nutrition, exercise, diet, and healthy lifestyle.  

## 2019-02-15 ENCOUNTER — Encounter: Payer: Self-pay | Admitting: Family Medicine

## 2019-02-15 ENCOUNTER — Telehealth (INDEPENDENT_AMBULATORY_CARE_PROVIDER_SITE_OTHER): Payer: Managed Care, Other (non HMO) | Admitting: Family Medicine

## 2019-02-15 ENCOUNTER — Other Ambulatory Visit (INDEPENDENT_AMBULATORY_CARE_PROVIDER_SITE_OTHER): Payer: Managed Care, Other (non HMO)

## 2019-02-15 ENCOUNTER — Other Ambulatory Visit: Payer: Self-pay

## 2019-02-15 VITALS — BP 130/80 | Temp 98.4°F | Ht 71.0 in | Wt 290.0 lb

## 2019-02-15 DIAGNOSIS — E7800 Pure hypercholesterolemia, unspecified: Secondary | ICD-10-CM

## 2019-02-15 DIAGNOSIS — Z0000 Encounter for general adult medical examination without abnormal findings: Secondary | ICD-10-CM

## 2019-02-15 DIAGNOSIS — E119 Type 2 diabetes mellitus without complications: Secondary | ICD-10-CM

## 2019-02-15 DIAGNOSIS — J42 Unspecified chronic bronchitis: Secondary | ICD-10-CM | POA: Diagnosis not present

## 2019-02-15 DIAGNOSIS — R5383 Other fatigue: Secondary | ICD-10-CM | POA: Insufficient documentation

## 2019-02-15 DIAGNOSIS — M109 Gout, unspecified: Secondary | ICD-10-CM

## 2019-02-15 DIAGNOSIS — N1831 Chronic kidney disease, stage 3a: Secondary | ICD-10-CM

## 2019-02-15 DIAGNOSIS — I10 Essential (primary) hypertension: Secondary | ICD-10-CM

## 2019-02-15 DIAGNOSIS — N028 Recurrent and persistent hematuria with other morphologic changes: Secondary | ICD-10-CM

## 2019-02-15 LAB — CBC WITH DIFFERENTIAL/PLATELET
Basophils Absolute: 0.1 K/uL (ref 0.0–0.1)
Basophils Relative: 0.6 % (ref 0.0–3.0)
Eosinophils Absolute: 0.3 K/uL (ref 0.0–0.7)
Eosinophils Relative: 2.9 % (ref 0.0–5.0)
HCT: 37.8 % — ABNORMAL LOW (ref 39.0–52.0)
Hemoglobin: 12.5 g/dL — ABNORMAL LOW (ref 13.0–17.0)
Lymphocytes Relative: 20.1 % (ref 12.0–46.0)
Lymphs Abs: 1.8 K/uL (ref 0.7–4.0)
MCHC: 33.2 g/dL (ref 30.0–36.0)
MCV: 94.3 fl (ref 78.0–100.0)
Monocytes Absolute: 0.5 K/uL (ref 0.1–1.0)
Monocytes Relative: 5.7 % (ref 3.0–12.0)
Neutro Abs: 6.4 K/uL (ref 1.4–7.7)
Neutrophils Relative %: 70.7 % (ref 43.0–77.0)
Platelets: 240 K/uL (ref 150.0–400.0)
RBC: 4.01 Mil/uL — ABNORMAL LOW (ref 4.22–5.81)
RDW: 14 % (ref 11.5–15.5)
WBC: 9.1 K/uL (ref 4.0–10.5)

## 2019-02-15 LAB — LIPID PANEL
Cholesterol: 159 mg/dL (ref 0–200)
HDL: 31.2 mg/dL — ABNORMAL LOW (ref >39.00)
NonHDL: 128.06
Total CHOL/HDL Ratio: 5
Triglycerides: 267 mg/dL — ABNORMAL HIGH (ref 0.0–149.0)
VLDL: 53.4 mg/dL — ABNORMAL HIGH (ref 0.0–40.0)

## 2019-02-15 LAB — LDL CHOLESTEROL, DIRECT: Direct LDL: 83 mg/dL

## 2019-02-15 LAB — HEMOGLOBIN A1C: Hgb A1c MFr Bld: 6.3 % (ref 4.6–6.5)

## 2019-02-15 LAB — COMPREHENSIVE METABOLIC PANEL
ALT: 20 U/L (ref 0–53)
AST: 16 U/L (ref 0–37)
Albumin: 4.2 g/dL (ref 3.5–5.2)
Alkaline Phosphatase: 110 U/L (ref 39–117)
BUN: 30 mg/dL — ABNORMAL HIGH (ref 6–23)
CO2: 25 mEq/L (ref 19–32)
Calcium: 9.4 mg/dL (ref 8.4–10.5)
Chloride: 104 mEq/L (ref 96–112)
Creatinine, Ser: 1.99 mg/dL — ABNORMAL HIGH (ref 0.40–1.50)
GFR: 34.68 mL/min — ABNORMAL LOW (ref >60.00)
Glucose, Bld: 118 mg/dL — ABNORMAL HIGH (ref 70–99)
Potassium: 4.9 mEq/L (ref 3.5–5.1)
Sodium: 138 mEq/L (ref 135–145)
Total Bilirubin: 0.6 mg/dL (ref 0.2–1.2)
Total Protein: 6.4 g/dL (ref 6.0–8.3)

## 2019-02-15 LAB — URIC ACID: Uric Acid, Serum: 10.1 mg/dL — ABNORMAL HIGH (ref 4.0–7.8)

## 2019-02-15 LAB — TSH: TSH: 2.17 u[IU]/mL (ref 0.35–4.50)

## 2019-02-15 LAB — VITAMIN D 25 HYDROXY (VIT D DEFICIENCY, FRACTURES): VITD: 28.55 ng/mL — ABNORMAL LOW (ref 30.00–100.00)

## 2019-02-15 MED ORDER — AMLODIPINE BESYLATE 10 MG PO TABS: ORAL_TABLET | ORAL | 0 refills | Status: DC

## 2019-02-15 MED ORDER — ALLOPURINOL 300 MG PO TABS: 300.0000 mg | ORAL_TABLET | Freq: Every day | ORAL | 1 refills | Status: DC

## 2019-02-15 NOTE — Assessment & Plan Note (Signed)
History: Symptoms began several months ago. The patient feels the fatigue began with: wife denies signs of sleep apnea.. Symptoms of his fatigue have been general malaise. Patient describes the following psychological symptoms: none. Patient denies significant change in weight. Symptom severity: symptoms bothersome, but easily able to carry out all usual work/school/family activities. Previous visits for this problem: none.   Assessment/Plan: Observation for worsening or other new symptoms such as pain, fever or weight loss, Diagnostic tests, as ordered and Focus on self care- diet, physical activity and stress reduction

## 2019-02-15 NOTE — Assessment & Plan Note (Signed)
Wt Readings from Last 3 Encounters:  02/15/19 290 lb (131.5 kg)  10/11/18 282 lb (127.9 kg)  09/27/18 290 lb (131.5 kg)

## 2019-02-16 ENCOUNTER — Other Ambulatory Visit: Payer: Self-pay | Admitting: Family Medicine

## 2019-02-16 LAB — IRON,TIBC AND FERRITIN PANEL
%SAT: 32 % (calc) (ref 20–48)
Ferritin: 64 ng/mL (ref 38–380)
Iron: 105 ug/dL (ref 50–180)
TIBC: 331 mcg/dL (calc) (ref 250–425)

## 2019-02-16 MED ORDER — VITAMIN D (ERGOCALCIFEROL) 1.25 MG (50000 UNIT) PO CAPS: 50000.0000 [IU] | ORAL_CAPSULE | ORAL | 0 refills | Status: DC

## 2019-02-20 ENCOUNTER — Other Ambulatory Visit: Payer: Self-pay

## 2019-02-20 MED ORDER — QVAR REDIHALER 80 MCG/ACT IN AERB: INHALATION_SPRAY | RESPIRATORY_TRACT | 0 refills | Status: DC

## 2019-03-13 ENCOUNTER — Other Ambulatory Visit: Payer: Self-pay | Admitting: Family Medicine

## 2019-03-13 DIAGNOSIS — I10 Essential (primary) hypertension: Secondary | ICD-10-CM

## 2019-04-05 ENCOUNTER — Other Ambulatory Visit: Payer: Self-pay | Admitting: Family Medicine

## 2019-04-06 ENCOUNTER — Other Ambulatory Visit: Payer: Self-pay | Admitting: Family Medicine

## 2019-04-06 MED ORDER — ATORVASTATIN CALCIUM 20 MG PO TABS: 20.0000 mg | ORAL_TABLET | Freq: Every day | ORAL | 0 refills | Status: DC

## 2019-04-06 MED ORDER — FUROSEMIDE 20 MG PO TABS: 20.0000 mg | ORAL_TABLET | Freq: Every day | ORAL | 3 refills | Status: DC

## 2019-04-06 MED ORDER — QVAR REDIHALER 80 MCG/ACT IN AERB: INHALATION_SPRAY | RESPIRATORY_TRACT | 11 refills | Status: DC

## 2019-05-06 ENCOUNTER — Ambulatory Visit: Payer: Managed Care, Other (non HMO) | Attending: Internal Medicine

## 2019-05-06 DIAGNOSIS — Z23 Encounter for immunization: Secondary | ICD-10-CM

## 2019-05-06 NOTE — Progress Notes (Signed)
   Covid-19 Vaccination Clinic  Name:  EULIS AHLQUIST    MRN: [id number] DOB: 04/12/1960  05/06/2019  Mr. Shrewsbury was observed post Covid-19 immunization for 15 minutes without incident. He was provided with Vaccine Information Sheet and instruction to access the V-Safe system.   Mr. Marcelo was instructed to call 911 with any severe reactions post vaccine: Marland Kitchen Difficulty breathing  . Swelling of face and throat  . A fast heartbeat  . A bad rash all over body  . Dizziness and weakness   Immunizations Administered    Name Date Dose VIS Date Route   Pfizer COVID-19 Vaccine 05/06/2019  9:15 AM 0.3 mL 02/03/2019 Intramuscular   Manufacturer: [place]   Lot: [id number]   [place]: [id number]

## 2019-05-08 LAB — COMPREHENSIVE METABOLIC PANEL
Albumin: 4.2 (ref 3.5–5.0)
Calcium: 9.9 (ref 8.7–10.7)
GFR calc Af Amer: 34
GFR calc non Af Amer: 29

## 2019-05-08 LAB — BASIC METABOLIC PANEL
BUN: 27 — AB (ref 4–21)
CO2: 26 — AB (ref 13–22)
Chloride: 100 (ref 99–108)
Creatinine: 2.4 — AB (ref 0.6–1.3)
Glucose: 79
Potassium: 5.1 (ref 3.4–5.3)
Sodium: 139 (ref 137–147)

## 2019-05-08 LAB — HEMOGLOBIN A1C: Hemoglobin A1C: 5.7

## 2019-05-10 ENCOUNTER — Other Ambulatory Visit: Payer: Self-pay

## 2019-05-10 MED ORDER — VITAMIN D (ERGOCALCIFEROL) 1.25 MG (50000 UNIT) PO CAPS: 50000.0000 [IU] | ORAL_CAPSULE | ORAL | 0 refills | Status: DC

## 2019-05-17 ENCOUNTER — Encounter: Payer: Self-pay | Admitting: Family Medicine

## 2019-05-17 NOTE — Progress Notes (Signed)
[facility] Nephrology/thx dmf

## 2019-05-30 ENCOUNTER — Ambulatory Visit: Payer: Managed Care, Other (non HMO) | Attending: Internal Medicine

## 2019-05-30 DIAGNOSIS — Z23 Encounter for immunization: Secondary | ICD-10-CM

## 2019-05-30 NOTE — Progress Notes (Signed)
   Covid-19 Vaccination Clinic  Name:  AADHI SMYSER    MRN: [id number] DOB: 06-01-1960  05/30/2019  Mr. Brinkerhoff was observed post Covid-19 immunization for 15 minutes without incident. He was provided with Vaccine Information Sheet and instruction to access the V-Safe system.   Mr. Cardella was instructed to call 911 with any severe reactions post vaccine: Marland Kitchen Difficulty breathing  . Swelling of face and throat  . A fast heartbeat  . A bad rash all over body  . Dizziness and weakness   Immunizations Administered    Name Date Dose VIS Date Route   Pfizer COVID-19 Vaccine 05/30/2019 11:38 AM 0.3 mL 02/03/2019 Intramuscular   Manufacturer: [place]   Lot: [id number]   [place]: [id number]

## 2019-05-30 NOTE — Progress Notes (Signed)
   Covid-19 Vaccination Clinic  Name:  SHIN HUDZINSKI    MRN: [id number] DOB: [birth year]  05/30/2019  Mr. Coulon was observed post Covid-19 immunization for 15 minutes without incident. He was provided with Vaccine Information Sheet and instruction to access the V-Safe system.   Mr. Vea was instructed to call 911 with any severe reactions post vaccine: Marland Kitchen Difficulty breathing  . Swelling of face and throat  . A fast heartbeat  . A bad rash all over body  . Dizziness and weakness   Immunizations Administered    Name Date Dose VIS Date Route   Pfizer COVID-19 Vaccine 05/30/2019 11:38 AM 0.3 mL 02/03/2019 Intramuscular   Manufacturer: [place]   Lot: [id number]   [place]: [id number]

## 2019-06-07 ENCOUNTER — Other Ambulatory Visit: Payer: Self-pay

## 2019-06-07 MED ORDER — FLUOXETINE HCL 20 MG PO CAPS: ORAL_CAPSULE | ORAL | 0 refills | Status: DC

## 2019-06-27 ENCOUNTER — Ambulatory Visit: Payer: Managed Care, Other (non HMO) | Admitting: Family Medicine

## 2019-06-27 ENCOUNTER — Encounter: Payer: Self-pay | Admitting: Family Medicine

## 2019-06-27 ENCOUNTER — Other Ambulatory Visit: Payer: Self-pay

## 2019-06-27 DIAGNOSIS — D235 Other benign neoplasm of skin of trunk: Secondary | ICD-10-CM

## 2019-06-27 DIAGNOSIS — N028 Recurrent and persistent hematuria with other morphologic changes: Secondary | ICD-10-CM

## 2019-06-27 DIAGNOSIS — E1159 Type 2 diabetes mellitus with other circulatory complications: Secondary | ICD-10-CM

## 2019-06-27 DIAGNOSIS — J42 Unspecified chronic bronchitis: Secondary | ICD-10-CM

## 2019-06-27 DIAGNOSIS — G2581 Restless legs syndrome: Secondary | ICD-10-CM

## 2019-06-27 DIAGNOSIS — E785 Hyperlipidemia, unspecified: Secondary | ICD-10-CM

## 2019-06-27 DIAGNOSIS — N1831 Chronic kidney disease, stage 3a: Secondary | ICD-10-CM

## 2019-06-27 DIAGNOSIS — I10 Essential (primary) hypertension: Secondary | ICD-10-CM

## 2019-06-27 DIAGNOSIS — E1169 Type 2 diabetes mellitus with other specified complication: Secondary | ICD-10-CM

## 2019-06-27 DIAGNOSIS — E119 Type 2 diabetes mellitus without complications: Secondary | ICD-10-CM | POA: Diagnosis not present

## 2019-06-27 DIAGNOSIS — M109 Gout, unspecified: Secondary | ICD-10-CM

## 2019-06-27 MED ORDER — TRULICITY 0.75 MG/0.5ML ~~LOC~~ SOAJ: 0.7500 mg | SUBCUTANEOUS | 11 refills | Status: DC

## 2019-06-27 NOTE — Assessment & Plan Note (Signed)
He weights more than he did when dx with mild sleep apnea.  Likely needs re-eval.

## 2019-06-27 NOTE — Assessment & Plan Note (Signed)
Followed at [facility].

## 2019-06-27 NOTE — Assessment & Plan Note (Signed)
At goal LDL < 100 on statin.

## 2019-06-27 NOTE — Assessment & Plan Note (Signed)
   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

## 2019-06-27 NOTE — Assessment & Plan Note (Signed)
Well controlled on Qvar and using albuterol prn. Previously seen by Dr. Melvyn Novas in past.. but has not since doping well.

## 2019-06-27 NOTE — Patient Instructions (Addendum)
Call for further evaluation in 1-2 month if neck mass larger, or tender or not improving.  Call sooner if unexpected weight loss, night sweats or fatigue.  Work on low carb diet and increase exercise. Start trulicity weekly for weight loss.

## 2019-06-27 NOTE — Assessment & Plan Note (Signed)
At last check good control.

## 2019-06-27 NOTE — Assessment & Plan Note (Signed)
Better controlled on allopurinol.

## 2019-06-27 NOTE — Assessment & Plan Note (Signed)
Well controlled on ibersartan, coreg and amlodi[pine.

## 2019-06-27 NOTE — Progress Notes (Signed)
Chief Complaint  Patient presents with  . Establish Care    Transfer of care from Dr. Deborra Medina    History of Present Illness: HPI    59 year old male presents to establish care. Previously seen by Dr. Deborra Medina.  Last CPX: 02/15/2019  Hypertension: Well controlled  On ARB, CCB, BB and lasix  BP Readings from Last 3 Encounters:  06/27/19 120/76  02/15/19 130/80  10/11/18 110/80    Diabetes:   Last check on metformin was good.. he was told to hold this per nephrology for now. Awaiting to hear. Lab Results  Component Value Date   HGBA1C 5.7 05/08/2019     He has also noted a lump on left lateral neck 2 weeks ago, minimally tender. No redness. Possible slight increase.  Mild sleep apnea in past, RLS: treated with occ muscle relaxant. SE to requip.   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   Well controlled cholesterol on  Atorvastatin. Lab Results  Component Value Date   CHOL 159 02/15/2019   HDL 31.20 (L) 02/15/2019   LDLCALC 178 (H) 07/03/2013   LDLDIRECT 83.0 02/15/2019   TRIG 267.0 (H) 02/15/2019   CHOLHDL 5 02/15/2019   Gout  2 flares in last year. When he was on allopurinol 100 mg.. does better on allopurinol 300 mg  Colchicine helps with acute treatments.  Chronic bronchitis and cough variant asthma: Well controlled on Qvar and albuterol prn.   This visit occurred during the SARS-CoV-2 public health emergency.  Safety protocols were in place, including screening questions prior to the visit, additional usage of staff PPE, and extensive cleaning of exam room while observing appropriate contact time as indicated for disinfecting solutions.   COVID 19 screen:  No recent travel or known exposure to COVID19 The patient denies respiratory symptoms of COVID 19 at this time. The importance of social distancing was discussed today.     Review of Systems  All other systems reviewed and are negative.     Past Medical History:  Diagnosis Date  .  Allergy    allergy workup with Dr. Caprice Red : Maybe trees Did not receive shots per patient  . Chronic bronchitis ([place])   . Chronic cough onset 2005/ resolved on ICS July 19,2011   Sinus CT negative 07/08/2009 negative  . Chronic kidney disease    per pt 01-04-2015--autoimmune disease per pt  . COPD (chronic obstructive pulmonary disease) ([place])    per pt 01-04-15  . Diabetes mellitus without complication ([place])   . Dysplastic nevus of trunk 02/04/2018  . GERD (gastroesophageal reflux disease)   . Hyperlipidemia   . Hypertension   . Restless leg syndrome   . Sleep apnea    does not have to use cpap    reports that he has never smoked. He quit smokeless tobacco use about 26 years ago. He reports that he does not drink alcohol or use drugs.   Current Outpatient Medications:  .  acetaminophen (TYLENOL) 500 MG tablet, Take 1,000 mg by mouth every 6 (six) hours as needed (pain). , Disp: , Rfl:  .  albuterol (PROVENTIL HFA;VENTOLIN HFA) 108 (90 Base) MCG/ACT inhaler, Inhale 2 puffs into the lungs every 4 (four) hours as needed for wheezing or shortness of breath., Disp: 1 Inhaler, Rfl: 0 .  allopurinol (ZYLOPRIM) 300 MG tablet, Take 1 tablet (300 mg total) by mouth daily., Disp: 90 tablet, Rfl: 1 .  amLODipine (NORVASC) 10 MG tablet, TAKE 1 TABLET BY  MOUTH ONCE A DAY, Disp: 90 tablet, Rfl: 3 .  aspirin 81 MG tablet, Take 81 mg by mouth daily., Disp: , Rfl:  .  atorvastatin (LIPITOR) 20 MG tablet, Take 1 tablet (20 mg total) by mouth daily., Disp: 90 tablet, Rfl: 0 .  beclomethasone (QVAR REDIHALER) 80 MCG/ACT inhaler, USE ONE PUFF TWICE A DAY TO PREVENT COUGH OR WHEEZING. RINSE, GARGLE, AND SPIT AFTER USE, Disp: 10.6 g, Rfl: 11 .  carvedilol (COREG) 3.125 MG tablet, TAKE 1 TABLET (3.125 MG TOTAL) BY MOUTH TWO (2) TIMES A DAY., Disp: , Rfl:  .  cetirizine (ZYRTEC) 10 MG tablet, Take 1 tablet (10 mg total) by mouth daily., Disp: 14 tablet, Rfl: 0 .  Cholecalciferol (VITAMIN D3 PO), Take 1  tablet by mouth daily., Disp: , Rfl:  .  cyclobenzaprine (FLEXERIL) 10 MG tablet, Take 1 tablet (10 mg total) by mouth 3 (three) times daily as needed for muscle spasms., Disp: 20 tablet, Rfl: 0 .  famotidine (PEPCID) 20 MG tablet, Take 20 mg by mouth at bedtime. , Disp: , Rfl:  .  fexofenadine (ALLEGRA) 180 MG tablet, Take 180 mg by mouth daily. , Disp: , Rfl:  .  FLUoxetine (PROZAC) 20 MG capsule, Take 1qd (Plz sched appt for any more fills), Disp: 30 capsule, Rfl: 0 .  furosemide (LASIX) 20 MG tablet, Take 1 tablet (20 mg total) by mouth daily. TAKE 1 TABLET BY MOUTH ONCE A DAY, Disp: 90 tablet, Rfl: 3 .  irbesartan (AVAPRO) 300 MG tablet, Take 1 tablet (300 mg total) by mouth daily., Disp: , Rfl:  .  Probiotic Product (PROBIOTIC PO), Take 1 tablet by mouth daily., Disp: , Rfl:  .  sodium chloride (OCEAN) 0.65 % SOLN nasal spray, Place 1 spray into both nostrils as needed for congestion., Disp: 15 mL, Rfl: 0 .  metFORMIN (GLUCOPHAGE) 500 MG tablet, Take 2 tablets by mouth 2 (two) times daily., Disp: , Rfl:    Observations/Objective: Blood pressure 120/76, pulse 84, temperature 98.6 F (37 C), temperature source Temporal, height 5\' 11"  (1.803 m), weight 298 lb 8 oz (135.4 kg), SpO2 95 %.  Physical Exam Constitutional:      Appearance: He is well-developed.  HENT:     Head: Normocephalic.     Right Ear: Hearing normal.     Left Ear: Hearing normal.     Nose: Nose normal.  Neck:     Thyroid: No thyroid mass or thyromegaly.     Vascular: No carotid bruit.     Trachea: Trachea normal.  Cardiovascular:     Rate and Rhythm: Normal rate and regular rhythm.     Pulses: Normal pulses.     Heart sounds: Heart sounds not distant. No murmur heard.  No friction rub. No gallop.      Comments: No peripheral edema Pulmonary:     Effort: Pulmonary effort is normal. No respiratory distress.     Breath sounds: Normal breath sounds.  Skin:    General: Skin is warm and dry.     Findings: No rash.   Psychiatric:        Speech: Speech normal.        Behavior: Behavior normal.        Thought Content: Thought content normal.      Assessment and Plan   RLS (restless legs syndrome) Using muscle relaxant prn.  SLEEP DISORDER  He weights more than he did when dx with mild sleep apnea.  Likely needs re-eval.  Morbid obesity ([place]) Encouraged exercise, weight loss, healthy eating habits.   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   DM type 2 (diabetes mellitus, type 2)  At last check good control.  Hypertension associated with diabetes ([place])  Well controlled on ibersartan, coreg and amlodi[pine.  Hyperlipidemia associated with type 2 diabetes mellitus ([place]) At goal LDL < 100 on statin.  GOUT  Better controlled on allopurinol.  Chronic bronchitis ([place]) Well controlled on Qvar and using albuterol prn. Previously seen by Dr. Melvyn Novas in past.. but has not since doping well.  Dysplastic nevus of trunk  Followed at [facility].     Richard Lofts, MD

## 2019-06-27 NOTE — Assessment & Plan Note (Signed)
Encouraged exercise, weight loss, healthy eating habits. ? ?

## 2019-06-27 NOTE — Assessment & Plan Note (Signed)
Using muscle relaxant prn.

## 2019-07-04 ENCOUNTER — Other Ambulatory Visit: Payer: Self-pay | Admitting: Family Medicine

## 2019-07-19 MED ORDER — ATORVASTATIN CALCIUM 20 MG PO TABS: 20.0000 mg | ORAL_TABLET | Freq: Every day | ORAL | 1 refills | Status: DC

## 2019-07-19 MED ORDER — FLUOXETINE HCL 20 MG PO CAPS: ORAL_CAPSULE | ORAL | 1 refills | Status: DC

## 2019-07-25 ENCOUNTER — Ambulatory Visit: Payer: Managed Care, Other (non HMO) | Admitting: Family Medicine

## 2019-07-25 ENCOUNTER — Encounter: Payer: Self-pay | Admitting: Family Medicine

## 2019-07-25 ENCOUNTER — Other Ambulatory Visit: Payer: Self-pay

## 2019-07-25 DIAGNOSIS — E119 Type 2 diabetes mellitus without complications: Secondary | ICD-10-CM | POA: Diagnosis not present

## 2019-07-25 DIAGNOSIS — N1831 Chronic kidney disease, stage 3a: Secondary | ICD-10-CM | POA: Diagnosis not present

## 2019-07-25 DIAGNOSIS — M109 Gout, unspecified: Secondary | ICD-10-CM

## 2019-07-25 MED ORDER — PREDNISONE 10 MG PO TABS: ORAL_TABLET | ORAL | 1 refills | Status: DC

## 2019-07-25 NOTE — Assessment & Plan Note (Signed)
ON allopurinol  300 mg daily.. follow up uric acid at next OV.  Discussed colchicine not ideal with his fluctuating GFR sometime < 30.  Instead will give rx to use for pred taper prn gout flare.

## 2019-07-25 NOTE — Assessment & Plan Note (Signed)
GFR back to 34 at last check at [facility]. Followed by renal.

## 2019-07-25 NOTE — Progress Notes (Signed)
Chief Complaint  Patient presents with  . Diabetes  . Gout    History of Present Illness: HPI  59 year old male presents for follow up on DM  Diabetes:   At last OV started on trulicity for weight loss and DM control. No longer on metformin.  No SE.  he has been eating healthier. Lab Results  Component Value Date   HGBA1C 5.7 05/08/2019  Using medications without difficulties: Hypoglycemic episodes: Hyperglycemic episodes: Feet problems: no ulcer. Blood Sugars averaging: not  Checking CBGs Hx of IGA nephropathy Cr: 2.4 in 04/2019.. down 1.95 05/25/2019  Wt Readings from Last 3 Encounters:  07/25/19 297 lb (134.7 kg)  06/27/19 298 lb 8 oz (135.4 kg)  02/15/19 290 lb (131.5 kg)      Gout: Bad flare  In last several weeks. Redness, warmth and sudden onset... lasted 1 week.  Used his fathers colchicine twice daily.     He is back on allopurinol in last 6 months.   Limiting exercise   This visit occurred during the SARS-CoV-2 public health emergency.  Safety protocols were in place, including screening questions prior to the visit, additional usage of staff PPE, and extensive cleaning of exam room while observing appropriate contact time as indicated for disinfecting solutions.   COVID 19 screen:  No recent travel or known exposure to COVID19 The patient denies respiratory symptoms of COVID 19 at this time. The importance of social distancing was discussed today.     Review of Systems  Constitutional: Negative for chills and fever.  HENT: Negative for congestion and ear pain.   Eyes: Negative for pain and redness.  Respiratory: Negative for cough and shortness of breath.   Cardiovascular: Negative for chest pain, palpitations and leg swelling.  Gastrointestinal: Negative for abdominal pain, blood in stool, constipation, diarrhea, nausea and vomiting.  Genitourinary: Negative for dysuria.  Musculoskeletal: Positive for joint pain. Negative for falls and myalgias.   Skin: Negative for rash.  Neurological: Negative for dizziness.  Psychiatric/Behavioral: Negative for depression. The patient is not nervous/anxious.       Past Medical History:  Diagnosis Date  . Allergy    allergy workup with Dr. Caprice Red : Maybe trees Did not receive shots per patient  . Chronic bronchitis ([place])   . Chronic cough onset 2005/ resolved on ICS July 19,2011   Sinus CT negative 07/08/2009 negative  . Chronic kidney disease    per pt 01-04-2015--autoimmune disease per pt  . COPD (chronic obstructive pulmonary disease) ([place])    per pt 01-04-15  . Diabetes mellitus without complication ([place])   . Dysplastic nevus of trunk 02/04/2018  . GERD (gastroesophageal reflux disease)   . Hyperlipidemia   . Hypertension   . Restless leg syndrome   . Sleep apnea    does not have to use cpap    reports that he has never smoked. He quit smokeless tobacco use about 26 years ago. He reports that he does not drink alcohol or use drugs.   Current Outpatient Medications:  .  acetaminophen (TYLENOL) 500 MG tablet, Take 1,000 mg by mouth every 6 (six) hours as needed (pain). , Disp: , Rfl:  .  albuterol (PROVENTIL HFA;VENTOLIN HFA) 108 (90 Base) MCG/ACT inhaler, Inhale 2 puffs into the lungs every 4 (four) hours as needed for wheezing or shortness of breath., Disp: 1 Inhaler, Rfl: 0 .  allopurinol (ZYLOPRIM) 300 MG tablet, Take 1 tablet (300 mg total) by mouth daily., Disp: 90 tablet, Rfl:  1 .  amLODipine (NORVASC) 10 MG tablet, TAKE 1 TABLET BY MOUTH ONCE A DAY, Disp: 90 tablet, Rfl: 3 .  aspirin 81 MG tablet, Take 81 mg by mouth daily., Disp: , Rfl:  .  atorvastatin (LIPITOR) 20 MG tablet, Take 1 tablet (20 mg total) by mouth daily., Disp: 90 tablet, Rfl: 1 .  beclomethasone (QVAR REDIHALER) 80 MCG/ACT inhaler, USE ONE PUFF TWICE A DAY TO PREVENT COUGH OR WHEEZING. RINSE, GARGLE, AND SPIT AFTER USE, Disp: 10.6 g, Rfl: 11 .  carvedilol (COREG) 3.125 MG tablet, TAKE 1 TABLET (3.125  MG TOTAL) BY MOUTH TWO (2) TIMES A DAY., Disp: , Rfl:  .  cetirizine (ZYRTEC) 10 MG tablet, Take 1 tablet (10 mg total) by mouth daily., Disp: 14 tablet, Rfl: 0 .  Cholecalciferol (VITAMIN D3 PO), Take 1 tablet by mouth daily., Disp: , Rfl:  .  cyclobenzaprine (FLEXERIL) 10 MG tablet, Take 1 tablet (10 mg total) by mouth 3 (three) times daily as needed for muscle spasms., Disp: 20 tablet, Rfl: 0 .  Dulaglutide (TRULICITY) [id number] [id number] SOPN, Inject 0.75 mg into the skin once a week., Disp: 3 mL, Rfl: 11 .  famotidine (PEPCID) 20 MG tablet, Take 20 mg by mouth at bedtime. , Disp: , Rfl:  .  fexofenadine (ALLEGRA) 180 MG tablet, Take 180 mg by mouth daily. , Disp: , Rfl:  .  FLUoxetine (PROZAC) 20 MG capsule, TAKE 1 CAPSULE BY MOUTH EVERY DAY, Disp: 90 capsule, Rfl: 1 .  furosemide (LASIX) 20 MG tablet, Take 1 tablet (20 mg total) by mouth daily. TAKE 1 TABLET BY MOUTH ONCE A DAY, Disp: 90 tablet, Rfl: 3 .  irbesartan (AVAPRO) 300 MG tablet, Take 1 tablet (300 mg total) by mouth daily., Disp: , Rfl:  .  metFORMIN (GLUCOPHAGE) 500 MG tablet, Take 2 tablets by mouth 2 (two) times daily., Disp: , Rfl:  .  Probiotic Product (PROBIOTIC PO), Take 1 tablet by mouth daily., Disp: , Rfl:  .  sodium chloride (OCEAN) 0.65 % SOLN nasal spray, Place 1 spray into both nostrils as needed for congestion., Disp: 15 mL, Rfl: 0   Observations/Objective: Blood pressure 122/70, pulse 79, height 5\' 11"  (1.803 m), weight 297 lb (134.7 kg), SpO2 97 %.  Physical Exam Constitutional:      Appearance: He is well-developed. He is obese.  HENT:     Head: Normocephalic.     Right Ear: Hearing normal.     Left Ear: Hearing normal.     Nose: Nose normal.  Neck:     Thyroid: No thyroid mass or thyromegaly.     Vascular: No carotid bruit.     Trachea: Trachea normal.  Cardiovascular:     Rate and Rhythm: Normal rate and regular rhythm.     Pulses: Normal pulses.     Heart sounds: Heart sounds not distant. No murmur. No  friction rub. No gallop.      Comments: No peripheral edema Pulmonary:     Effort: Pulmonary effort is normal. No respiratory distress.     Breath sounds: Normal breath sounds.  Skin:    General: Skin is warm and dry.     Findings: No rash.  Psychiatric:        Speech: Speech normal.        Behavior: Behavior normal.        Thought Content: Thought content normal.      Assessment and Plan   DM type 2 (diabetes mellitus, type 2)  Continue current dose of trulicity for weight loss. Encouraged exercise, weight loss, healthy eating habits.   GOUT ON allopurinol  300 mg daily.. follow up uric acid at next OV.  Discussed colchicine not ideal with his fluctuating GFR sometime < 30.  Instead will give rx to use for pred taper prn gout flare.  CKD (chronic kidney disease), stage III GFR back to 34 at last check at [facility]. Followed by renal.     Eliezer Lofts, MD

## 2019-07-25 NOTE — Patient Instructions (Addendum)
Continue current dose of trulicity.  Colchicine is not ideal to use in a patient with your renal function.  Use prednisone taper for a flare as needed.  Get back to exercise -5 days a wekk and continue low carb diet.  Check blood sugars every few days to make sure fasting sugars remaining < 120.

## 2019-07-25 NOTE — Assessment & Plan Note (Signed)
Continue current dose of trulicity for weight loss. Encouraged exercise, weight loss, healthy eating habits.

## 2019-08-03 LAB — HM DIABETES EYE EXAM

## 2019-08-07 MED ORDER — CARVEDILOL 3.125 MG PO TABS: ORAL_TABLET | ORAL | 1 refills | Status: DC

## 2019-08-16 ENCOUNTER — Other Ambulatory Visit: Payer: Self-pay | Admitting: *Deleted

## 2019-08-16 MED ORDER — ALLOPURINOL 300 MG PO TABS: 300.0000 mg | ORAL_TABLET | Freq: Every day | ORAL | 3 refills | Status: DC

## 2019-09-11 MED ORDER — IRBESARTAN 300 MG PO TABS: 300.0000 mg | ORAL_TABLET | Freq: Every day | ORAL | 1 refills | Status: DC

## 2019-09-19 ENCOUNTER — Other Ambulatory Visit: Payer: Self-pay

## 2019-09-19 ENCOUNTER — Other Ambulatory Visit (INDEPENDENT_AMBULATORY_CARE_PROVIDER_SITE_OTHER): Payer: Managed Care, Other (non HMO)

## 2019-09-19 ENCOUNTER — Telehealth: Payer: Self-pay | Admitting: Family Medicine

## 2019-09-19 DIAGNOSIS — E119 Type 2 diabetes mellitus without complications: Secondary | ICD-10-CM

## 2019-09-19 DIAGNOSIS — M109 Gout, unspecified: Secondary | ICD-10-CM

## 2019-09-19 LAB — COMPREHENSIVE METABOLIC PANEL
ALT: 16 U/L (ref 0–53)
AST: 15 U/L (ref 0–37)
Albumin: 4 g/dL (ref 3.5–5.2)
Alkaline Phosphatase: 114 U/L (ref 39–117)
BUN: 31 mg/dL — ABNORMAL HIGH (ref 6–23)
CO2: 28 mEq/L (ref 19–32)
Calcium: 9.2 mg/dL (ref 8.4–10.5)
Chloride: 104 mEq/L (ref 96–112)
Creatinine, Ser: 2.57 mg/dL — ABNORMAL HIGH (ref 0.40–1.50)
GFR: 25.76 mL/min — ABNORMAL LOW (ref >60.00)
Glucose, Bld: 128 mg/dL — ABNORMAL HIGH (ref 70–99)
Potassium: 5 mEq/L (ref 3.5–5.1)
Sodium: 138 mEq/L (ref 135–145)
Total Bilirubin: 0.5 mg/dL (ref 0.2–1.2)
Total Protein: 6.4 g/dL (ref 6.0–8.3)

## 2019-09-19 LAB — URIC ACID: Uric Acid, Serum: 8.3 mg/dL — ABNORMAL HIGH (ref 4.0–7.8)

## 2019-09-19 LAB — LDL CHOLESTEROL, DIRECT: Direct LDL: 73 mg/dL

## 2019-09-19 LAB — LIPID PANEL
Cholesterol: 138 mg/dL (ref 0–200)
HDL: 33.7 mg/dL — ABNORMAL LOW (ref >39.00)
NonHDL: 103.93
Total CHOL/HDL Ratio: 4
Triglycerides: 204 mg/dL — ABNORMAL HIGH (ref 0.0–149.0)
VLDL: 40.8 mg/dL — ABNORMAL HIGH (ref 0.0–40.0)

## 2019-09-19 LAB — HEMOGLOBIN A1C: Hgb A1c MFr Bld: 6.5 % (ref 4.6–6.5)

## 2019-09-19 NOTE — Telephone Encounter (Signed)
-----   Message from Cloyd Stagers, RT sent at 09/05/2019  2:24 PM EDT ----- Regarding: Lab Orders for Tuesday 7.27.2021 Please place lab orders for Tuesday 7.27.2021, office visit for f/u on Tuesday 8.3.2021 Thank you, Dyke Maes RT(R)

## 2019-09-26 ENCOUNTER — Encounter: Payer: Self-pay | Admitting: Family Medicine

## 2019-09-26 ENCOUNTER — Other Ambulatory Visit: Payer: Self-pay

## 2019-09-26 ENCOUNTER — Ambulatory Visit: Payer: Managed Care, Other (non HMO) | Admitting: Family Medicine

## 2019-09-26 VITALS — BP 112/62 | HR 88 | Temp 98.3°F | Ht 71.0 in | Wt 308.5 lb

## 2019-09-26 DIAGNOSIS — M109 Gout, unspecified: Secondary | ICD-10-CM

## 2019-09-26 DIAGNOSIS — R221 Localized swelling, mass and lump, neck: Secondary | ICD-10-CM | POA: Diagnosis not present

## 2019-09-26 DIAGNOSIS — E119 Type 2 diabetes mellitus without complications: Secondary | ICD-10-CM | POA: Diagnosis not present

## 2019-09-26 DIAGNOSIS — E1169 Type 2 diabetes mellitus with other specified complication: Secondary | ICD-10-CM

## 2019-09-26 DIAGNOSIS — N028 Recurrent and persistent hematuria with other morphologic changes: Secondary | ICD-10-CM

## 2019-09-26 DIAGNOSIS — E1159 Type 2 diabetes mellitus with other circulatory complications: Secondary | ICD-10-CM | POA: Diagnosis not present

## 2019-09-26 DIAGNOSIS — I152 Hypertension secondary to endocrine disorders: Secondary | ICD-10-CM

## 2019-09-26 DIAGNOSIS — E785 Hyperlipidemia, unspecified: Secondary | ICD-10-CM

## 2019-09-26 DIAGNOSIS — I10 Essential (primary) hypertension: Secondary | ICD-10-CM

## 2019-09-26 LAB — CBC WITH DIFFERENTIAL/PLATELET
Basophils Absolute: 0.1 10*3/uL (ref 0.0–0.1)
Basophils Relative: 1 % (ref 0.0–3.0)
Eosinophils Absolute: 0.3 10*3/uL (ref 0.0–0.7)
Eosinophils Relative: 4 % (ref 0.0–5.0)
HCT: 36.7 % — ABNORMAL LOW (ref 39.0–52.0)
Hemoglobin: 12.3 g/dL — ABNORMAL LOW (ref 13.0–17.0)
Lymphocytes Relative: 17 % (ref 12.0–46.0)
Lymphs Abs: 1.5 10*3/uL (ref 0.7–4.0)
MCHC: 33.5 g/dL (ref 30.0–36.0)
MCV: 93.5 fl (ref 78.0–100.0)
Monocytes Absolute: 0.7 10*3/uL (ref 0.1–1.0)
Monocytes Relative: 7.8 % (ref 3.0–12.0)
Neutro Abs: 6.1 10*3/uL (ref 1.4–7.7)
Neutrophils Relative %: 70.2 % (ref 43.0–77.0)
Platelets: 251 10*3/uL (ref 150.0–400.0)
RBC: 3.93 Mil/uL — ABNORMAL LOW (ref 4.22–5.81)
RDW: 14.2 % (ref 11.5–15.5)
WBC: 8.7 10*3/uL (ref 4.0–10.5)

## 2019-09-26 LAB — HM DIABETES FOOT EXAM

## 2019-09-26 NOTE — Assessment & Plan Note (Signed)
Well controlled. Continue current medication.  

## 2019-09-26 NOTE — Assessment & Plan Note (Signed)
Get back on track with low carb diet.

## 2019-09-26 NOTE — Assessment & Plan Note (Signed)
No red flags.  Likely lymph nodes in response to root canal and tooth issue. Given now with left anterior chest wall and clavicular pain.Marland Kitchen eval with [place] and cbc.

## 2019-09-26 NOTE — Assessment & Plan Note (Signed)
On allopurinol.. start low purine diet plan. No flares in last month.

## 2019-09-26 NOTE — Assessment & Plan Note (Signed)
Worsened GFR.. has upcoming OV with nephrology.

## 2019-09-26 NOTE — Progress Notes (Signed)
Chief Complaint  Patient presents with  . Diabetes    Trulicity Start  . Gout    History of Present Illness: HPI   59 year old male presents for DM and gout follow up.   Diabetes:  He has not lost weight but DM at good control Lab Results  Component Value Date   HGBA1C 6.5 09/19/2019  Using medications without difficulties: Hypoglycemic episodes: Hyperglycemic episodes: Feet problems: no ulcers Blood Sugars averaging: eye exam within last year: yes   Wt Readings from Last 3 Encounters:  09/26/19 (!) 308 lb 8 oz (139.9 kg)  07/25/19 297 lb (134.7 kg)  06/27/19 298 lb 8 oz (135.4 kg)     Gout:  On allopurinol 300 mg but uric acid  Decreased from 10 to 8.3  No further flares in last  Month. Lab Results  Component Value Date   LABURIC 8.3 (H) 09/19/2019    CKD followed by nephrology  GFR 25  Elevated Cholesterol: LDL at goal < 100 Using medications without problems: Muscle aches:  Diet compliance: poor.. sweets Exercise: walking Other complaints:   no edema, no fluid overload.   left neck nodule...  Present  sicne mod April.Marland Kitchen no change in size  Now left supraclavicular pain, collar bone pain, pain in left upper pectorales muscle in last month, no redness or swelling.  No change in acitivty   Had root canal for cavity 3-4 weeks ago. Had been issues in left lower tooth.. took antibiotics. This visit occurred during the SARS-CoV-2 public health emergency.  Safety protocols were in place, including screening questions prior to the visit, additional usage of staff PPE, and extensive cleaning of exam room while observing appropriate contact time as indicated for disinfecting solutions.   COVID 19 screen:  No recent travel or known exposure to COVID19 The patient denies respiratory symptoms of COVID 19 at this time. The importance of social distancing was discussed today.     Review of Systems  Constitutional: Negative for chills and fever.  HENT: Negative for  congestion and ear pain.   Eyes: Negative for pain and redness.  Respiratory: Negative for cough and shortness of breath.   Cardiovascular: Negative for chest pain, palpitations and leg swelling.  Gastrointestinal: Negative for abdominal pain, blood in stool, constipation, diarrhea, nausea and vomiting.  Genitourinary: Negative for dysuria.  Musculoskeletal: Negative for falls and myalgias.  Skin: Negative for rash.  Neurological: Negative for dizziness.  Psychiatric/Behavioral: Negative for depression. The patient is not nervous/anxious.       Past Medical History:  Diagnosis Date  . Allergy    allergy workup with Dr. Caprice Red : Maybe trees Did not receive shots per patient  . Chronic bronchitis ([place])   . Chronic cough onset 2005/ resolved on ICS July 19,2011   Sinus CT negative 07/08/2009 negative  . Chronic kidney disease    per pt 01-04-2015--autoimmune disease per pt  . COPD (chronic obstructive pulmonary disease) ([place])    per pt 01-04-15  . Diabetes mellitus without complication ([place])   . Dysplastic nevus of trunk 02/04/2018  . GERD (gastroesophageal reflux disease)   . Hyperlipidemia   . Hypertension   . Restless leg syndrome   . Sleep apnea    does not have to use cpap    reports that he has never smoked. He quit smokeless tobacco use about 26 years ago. He reports that he does not drink alcohol and does not use drugs.   Current Outpatient Medications:  .  acetaminophen (TYLENOL) 500 MG tablet, Take 1,000 mg by mouth every 6 (six) hours as needed (pain). , Disp: , Rfl:  .  albuterol (PROVENTIL HFA;VENTOLIN HFA) 108 (90 Base) MCG/ACT inhaler, Inhale 2 puffs into the lungs every 4 (four) hours as needed for wheezing or shortness of breath., Disp: 1 Inhaler, Rfl: 0 .  allopurinol (ZYLOPRIM) 300 MG tablet, Take 1 tablet (300 mg total) by mouth daily., Disp: 90 tablet, Rfl: 3 .  amLODipine (NORVASC) 10 MG tablet, TAKE 1 TABLET BY MOUTH ONCE A DAY, Disp: 90 tablet, Rfl:  3 .  aspirin 81 MG tablet, Take 81 mg by mouth daily., Disp: , Rfl:  .  atorvastatin (LIPITOR) 20 MG tablet, Take 1 tablet (20 mg total) by mouth daily., Disp: 90 tablet, Rfl: 1 .  beclomethasone (QVAR REDIHALER) 80 MCG/ACT inhaler, USE ONE PUFF TWICE A DAY TO PREVENT COUGH OR WHEEZING. RINSE, GARGLE, AND SPIT AFTER USE, Disp: 10.6 g, Rfl: 11 .  carvedilol (COREG) 3.125 MG tablet, TAKE 1 TABLET (3.125 MG TOTAL) BY MOUTH TWO (2) TIMES A DAY., Disp: 180 tablet, Rfl: 1 .  cetirizine (ZYRTEC) 10 MG tablet, Take 1 tablet (10 mg total) by mouth daily., Disp: 14 tablet, Rfl: 0 .  Cholecalciferol (VITAMIN D3 PO), Take 1 tablet by mouth daily., Disp: , Rfl:  .  cyclobenzaprine (FLEXERIL) 10 MG tablet, Take 1 tablet (10 mg total) by mouth 3 (three) times daily as needed for muscle spasms., Disp: 20 tablet, Rfl: 0 .  Dulaglutide (TRULICITY) 5.36 UY/4.0HK SOPN, Inject 0.75 mg into the skin once a week., Disp: 3 mL, Rfl: 11 .  famotidine (PEPCID) 20 MG tablet, Take 20 mg by mouth at bedtime. , Disp: , Rfl:  .  fexofenadine (ALLEGRA) 180 MG tablet, Take 180 mg by mouth daily. , Disp: , Rfl:  .  FLUoxetine (PROZAC) 20 MG capsule, TAKE 1 CAPSULE BY MOUTH EVERY DAY, Disp: 90 capsule, Rfl: 1 .  furosemide (LASIX) 20 MG tablet, Take 1 tablet (20 mg total) by mouth daily. TAKE 1 TABLET BY MOUTH ONCE A DAY, Disp: 90 tablet, Rfl: 3 .  irbesartan (AVAPRO) 300 MG tablet, Take 1 tablet (300 mg total) by mouth daily., Disp: 90 tablet, Rfl: 1 .  metFORMIN (GLUCOPHAGE) 500 MG tablet, Take 2 tablets by mouth 2 (two) times daily., Disp: , Rfl:  .  predniSONE (DELTASONE) 10 MG tablet, 3 tabs by mouth daily x 3 days, then 2 tabs by mouth daily x 2 days then 1 tab by mouth daily x 2 days FOR GOUT FLARE, Disp: 15 tablet, Rfl: 1 .  Probiotic Product (PROBIOTIC PO), Take 1 tablet by mouth daily., Disp: , Rfl:  .  sodium chloride (OCEAN) 0.65 % SOLN nasal spray, Place 1 spray into both nostrils as needed for congestion., Disp: 15 mL,  Rfl: 0   Observations/Objective: Blood pressure 112/62, pulse 88, temperature 98.3 F (36.8 C), temperature source Temporal, height 5\' 11"  (1.803 m), weight (!) 308 lb 8 oz (139.9 kg), SpO2 97 %.  Physical Exam Constitutional:      Appearance: He is well-developed. He is obese.  HENT:     Head: Normocephalic.     Right Ear: Hearing normal.     Left Ear: Hearing normal.     Nose: Nose normal.  Neck:     Thyroid: No thyroid mass or thyromegaly.     Vascular: No carotid bruit.     Trachea: Trachea normal.  Cardiovascular:     Rate and  Rhythm: Normal rate and regular rhythm.     Pulses: Normal pulses.     Heart sounds: Heart sounds not distant. No murmur heard.  No friction rub. No gallop.      Comments: No peripheral edema Pulmonary:     Effort: Pulmonary effort is normal. No respiratory distress.     Breath sounds: Normal breath sounds.  Musculoskeletal:     Left shoulder: Normal.       Arms:     Comments:  Sore to palpation in left clavicle and upper pec, supraclavicular area  no tenderness or nodularity over clavicular joints  Lymphadenopathy:     Comments:  Mobile mass left lower neck, likely lymph node.. flat quarter size  no clear supraclavicular lymphadenopathy but sore at site  Skin:    General: Skin is warm and dry.     Findings: No rash.  Psychiatric:        Speech: Speech normal.        Behavior: Behavior normal.        Thought Content: Thought content normal.    Diabetic foot exam: Normal inspection No skin breakdown No calluses  Normal DP pulses Normal sensation to light touch and monofilament Nails normal   Assessment and Plan DM type 2 (diabetes mellitus, type 2)  Get back on track with low carb diet.  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 has upcoming OV with nephrology.  Hypertension associated with diabetes ([place]) Well controlled. Continue current medication.   GOUT On allopurinol.. start low purine diet plan. No  flares in last month.  Hyperlipidemia associated with type 2 diabetes mellitus ([place]) Well controlled. Continue current medication.   Mass of left side of neck No red flags.  Likely lymph nodes in response to root canal and tooth issue. Given now with left anterior chest wall and clavicular pain.Marland Kitchen eval with [place] and cbc.      Eliezer Lofts, MD

## 2019-09-26 NOTE — Patient Instructions (Addendum)
Can increase Trulicity to 1.5 mg daily.  Low carb and low purine diet.   Please stop at the lab to have labs drawn. We will call to set up ultrasound of neck.  Low-Purine Eating Plan A low-purine eating plan involves making food choices to limit your intake of purine. Purine is a kind of uric acid. Too much uric acid in your blood can cause certain conditions, such as gout and kidney stones. Eating a low-purine diet can help control these conditions. What are tips for following this plan? Reading food labels   Avoid foods with saturated or Trans fat.  Check the ingredient list of grains-based foods, such as bread and cereal, to make sure that they contain whole grains.  Check the ingredient list of sauces or soups to make sure they do not contain meat or fish.  When choosing soft drinks, check the ingredient list to make sure they do not contain high-fructose corn syrup. Shopping  Buy plenty of fresh fruits and vegetables.  Avoid buying canned or fresh fish.  Buy dairy products labeled as low-fat or nonfat.  Avoid buying premade or processed foods. These foods are often high in fat, salt (sodium), and added sugar. Cooking  Use olive oil instead of butter when cooking. Oils like olive oil, canola oil, and sunflower oil contain healthy fats. Meal planning  Learn which foods do or do not affect you. If you find out that a food tends to cause your gout symptoms to flare up, avoid eating that food. You can enjoy foods that do not cause problems. If you have any questions about a food item, talk with your dietitian or health care provider.  Limit foods high in fat, especially saturated fat. Fat makes it harder for your body to get rid of uric acid.  Choose foods that are lower in fat and are lean sources of protein. General guidelines  Limit alcohol intake to no more than 1 drink a day for nonpregnant women and 2 drinks a day for men. One drink equals 12 oz of beer, 5 oz of wine, or  1 oz of hard liquor. Alcohol can affect the way your body gets rid of uric acid.  Drink plenty of water to keep your urine clear or pale yellow. Fluids can help remove uric acid from your body.  If directed by your health care provider, take a vitamin C supplement.  Work with your health care provider and dietitian to develop a plan to achieve or maintain a healthy weight. Losing weight can help reduce uric acid in your blood. What foods are recommended? The items listed may not be a complete list. Talk with your dietitian about what dietary choices are best for you. Foods low in purines Foods low in purines do not need to be limited. These include:  All fruits.  All low-purine vegetables, pickles, and olives.  Breads, pasta, rice, cornbread, and popcorn. Cake and other baked goods.  All dairy foods.  Eggs, nuts, and nut butters.  Spices and condiments, such as salt, herbs, and vinegar.  Plant oils, butter, and margarine.  Water, sugar-free soft drinks, tea, coffee, and cocoa.  Vegetable-based soups, broths, sauces, and gravies. Foods moderate in purines Foods moderate in purines should be limited to the amounts listed.   cup of asparagus, cauliflower, spinach, mushrooms, or green peas, each day.  2/3 cup uncooked oatmeal, each day.   cup dry wheat bran or wheat germ, each day.  2-3 ounces of meat or poultry,  each day.  4-6 ounces of shellfish, such as crab, lobster, oysters, or shrimp, each day.  1 cup cooked beans, peas, or lentils, each day.  Soup, broths, or bouillon made from meat or fish. Limit these foods as much as possible. What foods are not recommended? The items listed may not be a complete list. Talk with your dietitian about what dietary choices are best for you. Limit your intake of foods high in purines, including:  Beer and other alcohol.  Meat-based gravy or sauce.  Canned or fresh fish, such as: ? Anchovies, sardines, herring, and  tuna. ? Mussels and scallops. ? Codfish, trout, and haddock.  Berniece Salines.  Organ meats, such as: ? Liver or kidney. ? Tripe. ? Sweetbreads (thymus gland or pancreas).  Wild Clinical biochemist.  Yeast or yeast extract supplements.  Drinks sweetened with high-fructose corn syrup. Summary  Eating a low-purine diet can help control conditions caused by too much uric acid in the body, such as gout or kidney stones.  Choose low-purine foods, limit alcohol, and limit foods high in fat.  You will learn over time which foods do or do not affect you. If you find out that a food tends to cause your gout symptoms to flare up, avoid eating that food. This information is not intended to replace advice given to you by your health care provider. Make sure you discuss any questions you have with your health care provider. Document Revised: 01/22/2017 Document Reviewed: 03/25/2016 Elsevier Patient Education  2020 [employer].

## 2019-10-02 ENCOUNTER — Ambulatory Visit
Admission: RE | Admit: 2019-10-02 | Discharge: 2019-10-02 | Disposition: A | Discharge status: Home / Self Care | Payer: Managed Care, Other (non HMO) | Source: Ambulatory Visit | Attending: Family Medicine | Admitting: Family Medicine

## 2019-10-02 DIAGNOSIS — R221 Localized swelling, mass and lump, neck: Secondary | ICD-10-CM

## 2019-10-03 ENCOUNTER — Other Ambulatory Visit: Payer: Self-pay | Admitting: Family Medicine

## 2019-10-03 MED ORDER — AMOXICILLIN 500 MG PO CAPS: 1000.0000 mg | ORAL_CAPSULE | Freq: Two times a day (BID) | ORAL | 0 refills | Status: DC

## 2019-11-08 DIAGNOSIS — N028 Recurrent and persistent hematuria with other morphologic changes: Secondary | ICD-10-CM | POA: Diagnosis not present

## 2019-11-08 DIAGNOSIS — N183 Chronic kidney disease, stage 3 unspecified: Secondary | ICD-10-CM | POA: Diagnosis not present

## 2019-11-08 DIAGNOSIS — N2581 Secondary hyperparathyroidism of renal origin: Secondary | ICD-10-CM | POA: Diagnosis not present

## 2019-11-08 DIAGNOSIS — M109 Gout, unspecified: Secondary | ICD-10-CM | POA: Diagnosis not present

## 2019-11-09 DIAGNOSIS — M109 Gout, unspecified: Secondary | ICD-10-CM | POA: Diagnosis not present

## 2019-11-09 DIAGNOSIS — N184 Chronic kidney disease, stage 4 (severe): Secondary | ICD-10-CM | POA: Diagnosis not present

## 2019-11-09 DIAGNOSIS — N2581 Secondary hyperparathyroidism of renal origin: Secondary | ICD-10-CM | POA: Diagnosis not present

## 2019-11-09 DIAGNOSIS — E119 Type 2 diabetes mellitus without complications: Secondary | ICD-10-CM | POA: Diagnosis not present

## 2019-11-13 ENCOUNTER — Other Ambulatory Visit: Payer: Self-pay | Admitting: *Deleted

## 2019-11-13 MED ORDER — TRULICITY 1.5 MG/0.5ML ~~LOC~~ SOAJ
1.5000 mg | SUBCUTANEOUS | 3 refills | Status: DC
Start: 2019-11-13 — End: 2020-02-13

## 2019-12-18 ENCOUNTER — Other Ambulatory Visit: Payer: Self-pay | Admitting: *Deleted

## 2019-12-29 ENCOUNTER — Ambulatory Visit: Payer: Managed Care, Other (non HMO) | Admitting: Family Medicine

## 2020-01-09 ENCOUNTER — Other Ambulatory Visit: Payer: Self-pay | Admitting: Family Medicine

## 2020-01-14 ENCOUNTER — Other Ambulatory Visit: Payer: Self-pay | Admitting: Family Medicine

## 2020-01-31 ENCOUNTER — Other Ambulatory Visit: Payer: Self-pay | Admitting: Family Medicine

## 2020-02-05 ENCOUNTER — Other Ambulatory Visit: Payer: Self-pay | Admitting: Family Medicine

## 2020-02-13 ENCOUNTER — Ambulatory Visit (INDEPENDENT_AMBULATORY_CARE_PROVIDER_SITE_OTHER): Payer: BC Managed Care – PPO | Admitting: Family Medicine

## 2020-02-13 ENCOUNTER — Encounter: Payer: Self-pay | Admitting: Family Medicine

## 2020-02-13 ENCOUNTER — Other Ambulatory Visit: Payer: Self-pay

## 2020-02-13 VITALS — BP 140/80 | HR 94 | Temp 97.9°F | Ht 71.0 in | Wt 319.0 lb

## 2020-02-13 DIAGNOSIS — N1831 Chronic kidney disease, stage 3a: Secondary | ICD-10-CM

## 2020-02-13 DIAGNOSIS — M109 Gout, unspecified: Secondary | ICD-10-CM | POA: Diagnosis not present

## 2020-02-13 DIAGNOSIS — J069 Acute upper respiratory infection, unspecified: Secondary | ICD-10-CM

## 2020-02-13 DIAGNOSIS — E119 Type 2 diabetes mellitus without complications: Secondary | ICD-10-CM

## 2020-02-13 LAB — POCT GLYCOSYLATED HEMOGLOBIN (HGB A1C): Hemoglobin A1C: 7.7 % — AB (ref 4.0–5.6)

## 2020-02-13 MED ORDER — PREDNISONE 20 MG PO TABS: ORAL_TABLET | ORAL | 0 refills | Status: AC

## 2020-02-13 MED ORDER — DAPAGLIFLOZIN PROPANEDIOL 5 MG PO TABS: 5.0000 mg | ORAL_TABLET | Freq: Every day | ORAL | 3 refills | Status: DC

## 2020-02-13 NOTE — Patient Instructions (Addendum)
Fill Farxiga once daily.Marland Kitchen if not covered by insurance  Instead start metformin 500 mg once daily, if tolerated increase to twice daily. Work on low [employer], regualr exercise and weight loss.

## 2020-02-13 NOTE — Progress Notes (Signed)
Patient ID: Richard Giles, male    DOB: Jul 18, 1960, 59 y.o.   MRN: [id number]  This visit was conducted in person.  BP 140/80   Pulse 94   Temp 97.9 F (36.6 C) (Temporal)   Ht 5\' 11"  (1.803 m)   Wt (!) 319 lb (144.7 kg)   SpO2 97%   BMI 44.49 kg/m    CC:  DM follow up Subjective:   HPI: Richard Giles is a 59 y.o. male presenting on 02/13/2020 for Diabetes    Diabetes:   SE to metformin.  He was not able to afford trulicity 1.5 mg weekly.. so tried to fill Jardiance... both were to costly on new insurance.  he has gained weight, has not been working on exercise or diet.   GFR 31 Lab Results  Component Value Date   HGBA1C 7.7 (A) 02/13/2020  Using medications without difficulties: Hypoglycemic episodes: Hyperglycemic episodes: Feet problems: none Blood Sugars averaging: eye exam within last year:yes    CKD  Wt Readings from Last 3 Encounters:  02/13/20 (!) 319 lb (144.7 kg)  09/26/19 (!) 308 lb 8 oz (139.9 kg)  07/25/19 297 lb (134.7 kg)        Relevant past medical, surgical, family and social history reviewed and updated as indicated. Interim medical history since our last visit reviewed. Allergies and medications reviewed and updated. Outpatient Medications Prior to Visit  Medication Sig Dispense Refill  . acetaminophen (TYLENOL) 500 MG tablet Take 1,000 mg by mouth every 6 (six) hours as needed (pain).    Marland Kitchen albuterol (PROVENTIL HFA;VENTOLIN HFA) 108 (90 Base) MCG/ACT inhaler Inhale 2 puffs into the lungs every 4 (four) hours as needed for wheezing or shortness of breath. 1 Inhaler 0  . allopurinol (ZYLOPRIM) 300 MG tablet Take 1 tablet (300 mg total) by mouth daily. 90 tablet 3  . amLODipine (NORVASC) 10 MG tablet TAKE 1 TABLET BY MOUTH ONCE A DAY 90 tablet 3  . aspirin 81 MG tablet Take 81 mg by mouth daily.    Marland Kitchen atorvastatin (LIPITOR) 20 MG tablet TAKE 1 TABLET BY MOUTH EVERY DAY 90 tablet 2  . beclomethasone (QVAR REDIHALER) 80 MCG/ACT inhaler USE ONE  PUFF TWICE A DAY TO PREVENT COUGH OR WHEEZING. RINSE, GARGLE, AND SPIT AFTER USE 10.6 g 11  . carvedilol (COREG) 3.125 MG tablet TAKE 1 TABLET (3.125 MG TOTAL) BY MOUTH TWO (2) TIMES A DAY. 180 tablet 1  . cetirizine (ZYRTEC) 10 MG tablet Take 1 tablet (10 mg total) by mouth daily. 14 tablet 0  . Cholecalciferol (VITAMIN D3 PO) Take 1 tablet by mouth daily.    . cyclobenzaprine (FLEXERIL) 10 MG tablet Take 1 tablet (10 mg total) by mouth 3 (three) times daily as needed for muscle spasms. 20 tablet 0  . famotidine (PEPCID) 20 MG tablet Take 20 mg by mouth at bedtime.    . fexofenadine (ALLEGRA) 180 MG tablet Take 180 mg by mouth daily.     Marland Kitchen FLUoxetine (PROZAC) 20 MG capsule TAKE 1 CAPSULE BY MOUTH EVERY DAY 90 capsule 1  . furosemide (LASIX) 20 MG tablet Take 1 tablet (20 mg total) by mouth daily. TAKE 1 TABLET BY MOUTH ONCE A DAY 90 tablet 3  . irbesartan (AVAPRO) 300 MG tablet Take 1 tablet (300 mg total) by mouth daily. 90 tablet 1  . Probiotic Product (PROBIOTIC PO) Take 1 tablet by mouth daily.    . sodium chloride (OCEAN) 0.65 % SOLN nasal spray  Place 1 spray into both nostrils as needed for congestion. 15 mL 0  . Dulaglutide (TRULICITY) 1.5 0000000 SOPN Inject 1.5 mg into the skin once a week. (Patient not taking: Reported on 02/13/2020) 6 mL 3   No facility-administered medications prior to visit.     Per HPI unless specifically indicated in ROS section below Review of Systems  Constitutional: Negative for fatigue and fever.  HENT: Negative for ear pain.   Eyes: Negative for pain.  Respiratory: Negative for cough and shortness of breath.   Cardiovascular: Negative for chest pain, palpitations and leg swelling.  Gastrointestinal: Negative for abdominal pain.  Genitourinary: Negative for dysuria.  Musculoskeletal: Negative for arthralgias.  Neurological: Negative for syncope, light-headedness and headaches.  Psychiatric/Behavioral: Negative for dysphoric mood.   Objective:  BP  140/80   Pulse 94   Temp 97.9 F (36.6 C) (Temporal)   Ht 5\' 11"  (1.803 m)   Wt (!) 319 lb (144.7 kg)   SpO2 97%   BMI 44.49 kg/m   Wt Readings from Last 3 Encounters:  02/13/20 (!) 319 lb (144.7 kg)  09/26/19 (!) 308 lb 8 oz (139.9 kg)  07/25/19 297 lb (134.7 kg)      Physical Exam Constitutional:      General: Vital signs are normal.     Appearance: He is well-developed and well-nourished.  HENT:     Head: Normocephalic.     Right Ear: Hearing normal.     Left Ear: Hearing normal.     Nose: Nose normal.     Mouth/Throat:     Mouth: Oropharynx is clear and moist and mucous membranes are normal.  Neck:     Thyroid: No thyroid mass or thyromegaly.     Vascular: No carotid bruit.     Trachea: Trachea normal.  Cardiovascular:     Rate and Rhythm: Normal rate and regular rhythm.     Pulses: Normal pulses.     Heart sounds: Heart sounds not distant. No murmur heard. No friction rub. No gallop.      Comments: No peripheral edema Pulmonary:     Effort: Pulmonary effort is normal. No respiratory distress.     Breath sounds: Normal breath sounds.  Skin:    General: Skin is warm, dry and intact.     Findings: No rash.  Psychiatric:        Mood and Affect: Mood and affect normal.        Speech: Speech normal.        Behavior: Behavior normal.        Thought Content: Thought content normal.       Results for orders placed or performed in visit on 02/13/20  POCT glycosylated hemoglobin (Hb A1C)  Result Value Ref Range   Hemoglobin A1C 7.7 (A) 4.0 - 5.6 %   HbA1c POC (<> result, manual entry)     HbA1c, POC (prediabetic range)     HbA1c, POC (controlled diabetic range)     Diabetic foot exam: Normal inspection No skin breakdown No calluses  Normal DP pulses Normal sensation to light touch and monofilament Nails normal  This visit occurred during the SARS-CoV-2 public health emergency.  Safety protocols were in place, including screening questions prior to the  visit, additional usage of staff PPE, and extensive cleaning of exam room while observing appropriate contact time as indicated for disinfecting solutions.   COVID 19 screen:  No recent travel or known exposure to [place] The patient denies respiratory symptoms  of COVID 19 at this time. The importance of social distancing was discussed today.   Assessment and Plan    Problem List Items Addressed This Visit    CKD (chronic kidney disease), stage III ([place])    Lab Results  Component Value Date   CREATININE 2.57 (H) 09/19/2019   CREATININE 2.4 (A) 05/08/2019   CREATININE 1.99 (H) 02/15/2019   reviewed most recent labs to determine appropriate DM medication choice.       DM type 2 (diabetes mellitus, type 2) ([place]) - Primary    Not able to afford trulicity or jardiance.  Will try to see if farxiga less costly ... as has added CKD benefit.  If to costly he will restart low dose metformin. Follow up in 3 months.  Encouraged exercise, weight loss, healthy eating habits.       Relevant Medications   dapagliflozin propanediol (FARXIGA) 5 MG TABS tablet   Other Relevant Orders   POCT glycosylated hemoglobin (Hb A1C) (Completed)   GOUT    Given prednisone course to have on hand for gout falres but encouraged him to use only if sure gout flare as will increase glucose. NSAIDs contraindicated in his CKD.       Other Visit Diagnoses    Acute upper respiratory infection           Eliezer Lofts, MD

## 2020-02-21 NOTE — Assessment & Plan Note (Signed)
Not able to afford trulicity or jardiance.  Will try to see if farxiga less costly ... as has added CKD benefit.  If to costly he will restart low dose metformin. Follow up in 3 months.  Encouraged exercise, weight loss, healthy eating habits.

## 2020-02-21 NOTE — Assessment & Plan Note (Signed)
Given prednisone course to have on hand for gout falres but encouraged him to use only if sure gout flare as will increase glucose. NSAIDs contraindicated in his CKD.

## 2020-02-21 NOTE — Assessment & Plan Note (Signed)
Lab Results  Component Value Date   CREATININE 2.57 (H) 09/19/2019   CREATININE 2.4 (A) 05/08/2019   CREATININE 1.99 (H) 02/15/2019   reviewed most recent labs to determine appropriate DM medication choice.

## 2020-03-01 ENCOUNTER — Other Ambulatory Visit: Payer: Self-pay | Admitting: *Deleted

## 2020-03-01 DIAGNOSIS — I10 Essential (primary) hypertension: Secondary | ICD-10-CM

## 2020-03-01 MED ORDER — AMLODIPINE BESYLATE 10 MG PO TABS: ORAL_TABLET | ORAL | 3 refills | Status: DC

## 2020-03-04 ENCOUNTER — Other Ambulatory Visit: Payer: Self-pay | Admitting: Family Medicine

## 2020-03-27 ENCOUNTER — Other Ambulatory Visit: Payer: Self-pay | Admitting: *Deleted

## 2020-03-27 MED ORDER — FUROSEMIDE 20 MG PO TABS: 20.0000 mg | ORAL_TABLET | Freq: Every day | ORAL | 1 refills | Status: DC

## 2020-04-10 DIAGNOSIS — M109 Gout, unspecified: Secondary | ICD-10-CM | POA: Diagnosis not present

## 2020-04-10 DIAGNOSIS — E119 Type 2 diabetes mellitus without complications: Secondary | ICD-10-CM | POA: Diagnosis not present

## 2020-04-10 DIAGNOSIS — N183 Chronic kidney disease, stage 3 unspecified: Secondary | ICD-10-CM | POA: Diagnosis not present

## 2020-04-25 ENCOUNTER — Telehealth: Payer: Self-pay | Admitting: Family Medicine

## 2020-04-25 DIAGNOSIS — I10 Essential (primary) hypertension: Secondary | ICD-10-CM | POA: Diagnosis not present

## 2020-04-25 DIAGNOSIS — M109 Gout, unspecified: Secondary | ICD-10-CM | POA: Diagnosis not present

## 2020-04-25 DIAGNOSIS — N028 Recurrent and persistent hematuria with other morphologic changes: Secondary | ICD-10-CM | POA: Diagnosis not present

## 2020-04-25 DIAGNOSIS — N183 Chronic kidney disease, stage 3 unspecified: Secondary | ICD-10-CM | POA: Diagnosis not present

## 2020-04-25 NOTE — Telephone Encounter (Signed)
Dr. Percell Miller called in wanted to touch base Dr. Diona Browner and she did a virtual apt with him and wanted to talk about the cost of diabetes medication

## 2020-05-03 ENCOUNTER — Encounter: Payer: Self-pay | Admitting: *Deleted

## 2020-05-03 NOTE — Telephone Encounter (Signed)
Left message for Dr. Percell Miller that Dr. Diona Browner received her message and we are looking into David's diabetic medication cost.  I ask that if she still needs to speak with Dr. Diona Browner, to call our office back.  I have sent Shanon Brow a MyChart message asking him to schedule a DM appointment with fasting labs prior for later this month.  I have printed the formulary of diabetic medications from [facility] and placed in Dr. Rometta Emery office in box to review.  I believe patient's must have be on Medicare to see pharmacist on staff.

## 2020-05-03 NOTE — Telephone Encounter (Signed)
Reviewed formulary.Marland Kitchen only meds  more cost effective or sulfonylurea, metfomrin and pioglitazone.  Pt did not tolerate metformin.  Will discuss further  with pt at upcoming [place].

## 2020-05-03 NOTE — Telephone Encounter (Signed)
Let Dr. Percell Miller know we will look into cost of medication.  Call patient.Marland Kitchen ask him to bring in a formulary list for his covered DM meds. [id number] would be ideal for him to use but I can try to find other options if I know what is covered for him.  I do not think he can see CCM pharmacist? Right?

## 2020-05-06 NOTE — Telephone Encounter (Signed)
Spoke with patient stated he will call back to schedule appointment

## 2020-05-06 NOTE — Telephone Encounter (Signed)
-----   Message from Carter Kitten, [place] sent at 05/06/2020 10:58 AM EDT ----- Please call and schedule [place] an appointment with Dr. Diona Browner to follow up on his diabetes.  Thanks, Butch Penny

## 2020-05-09 ENCOUNTER — Other Ambulatory Visit: Payer: Self-pay

## 2020-05-09 ENCOUNTER — Ambulatory Visit (INDEPENDENT_AMBULATORY_CARE_PROVIDER_SITE_OTHER): Payer: BC Managed Care – PPO | Admitting: Family Medicine

## 2020-05-09 ENCOUNTER — Encounter: Payer: Self-pay | Admitting: Family Medicine

## 2020-05-09 VITALS — BP 102/66 | HR 72 | Temp 97.8°F | Ht 71.0 in | Wt 316.5 lb

## 2020-05-09 DIAGNOSIS — E1169 Type 2 diabetes mellitus with other specified complication: Secondary | ICD-10-CM

## 2020-05-09 DIAGNOSIS — E119 Type 2 diabetes mellitus without complications: Secondary | ICD-10-CM | POA: Diagnosis not present

## 2020-05-09 DIAGNOSIS — R0683 Snoring: Secondary | ICD-10-CM | POA: Diagnosis not present

## 2020-05-09 DIAGNOSIS — E785 Hyperlipidemia, unspecified: Secondary | ICD-10-CM

## 2020-05-09 DIAGNOSIS — N1831 Chronic kidney disease, stage 3a: Secondary | ICD-10-CM

## 2020-05-09 DIAGNOSIS — E1159 Type 2 diabetes mellitus with other circulatory complications: Secondary | ICD-10-CM | POA: Diagnosis not present

## 2020-05-09 DIAGNOSIS — I152 Hypertension secondary to endocrine disorders: Secondary | ICD-10-CM

## 2020-05-09 MED ORDER — DAPAGLIFLOZIN PROPANEDIOL 10 MG PO TABS: 10.0000 mg | ORAL_TABLET | Freq: Every day | ORAL | 3 refills | Status: DC

## 2020-05-09 NOTE — Assessment & Plan Note (Addendum)
Followed by Dr. Dena Billet renal.Due to  IgA nephropathy

## 2020-05-09 NOTE — Progress Notes (Signed)
I reviewed his insurance formulary and both the [place] and Trulicity are tier 2, preferred brands. I'm not quite sure what the issue is. I spoke with Shanon Brow and he said the Wilder Glade is covered but he has to meet a deductible first. After he meets the deductible, a coupon may be helpful. Left him a VM to discuss coupons. I will email these coupons when he gives me a call back. Let me know how else I can help!  Wilder Glade - [email or web address]  Trulicity - [email or web address]

## 2020-05-09 NOTE — Patient Instructions (Addendum)
Increase Farxiga to 10 mg daily.  Start regular exercise.. likely walking 20-30 2-3 times a week.  Check blood sugar daily fasting... goal < 120,  2 hours after meals .. goal < 180. We will set you up with a sleep evaluation.   Call insurance.. Is invokana 300 mg cheaper for me than farxiga 10 mg daily?

## 2020-05-09 NOTE — Assessment & Plan Note (Signed)
Stable, chronic.  Continue current medication. ° ° ° atorvastatin 20 mg daily °

## 2020-05-09 NOTE — Assessment & Plan Note (Signed)
Good control on  Amlodipine and lower dose Coreg.

## 2020-05-09 NOTE — Assessment & Plan Note (Signed)
Refer for sleep apnea evaluation. Encouraged exercise, weight loss, healthy eating habits.

## 2020-05-09 NOTE — Progress Notes (Signed)
Patient ID: Richard Giles, male    DOB: 1960-07-07, 60 y.o.   MRN: [id number]  This visit was conducted in person.  BP 102/66   Pulse 72   Temp 97.8 F (36.6 C) (Temporal)   Ht 5\' 11"  (1.803 m)   Wt (!) 316 lb 8 oz (143.6 kg)   SpO2 95%   BMI 44.14 kg/m    CC:  Chief Complaint  Patient presents with  . Diabetes    Subjective:   HPI: Richard Giles is a 60 y.o. male presenting on 05/09/2020 for Diabetes  Diabetes:   Worsening control of DM A1C 8... cannot afford  GLP1 ( despite prior  authorization completed 10/2019) SE to metformin in past as well as GFR  32. Only meds on formulary are sulfonylurea, metformin and pioglitazone. He now states SGLT2i is better covered once he gets past his  1000$ deductable.. 30% reduction in cost.   Using medications without difficulties: no SE to farxiga Hypoglycemic episodes: Hyperglycemic episodes:not chekcing Feet problems: none No chest pain.  Decreased energy, falling asleep during the day  No headaches.  BP Readings from Last 3 Encounters:  05/09/20 102/66  02/13/20 140/80  09/26/19 112/62   CKD Most recent GFR 32.Marland Kitchen Spoke with Dr Percell Miller today via phone.. she stated SGLT2i indicated  In this patient... he would tolerate higher dose of farxiga but she was doubtful it would improve glycemic index more given his decreased renal function.     Relevant past medical, surgical, family and social history reviewed and updated as indicated. Interim medical history since our last visit reviewed. Allergies and medications reviewed and updated. Outpatient Medications Prior to Visit  Medication Sig Dispense Refill  . acetaminophen (TYLENOL) 500 MG tablet Take 1,000 mg by mouth every 6 (six) hours as needed (pain).    Marland Kitchen albuterol (PROVENTIL HFA;VENTOLIN HFA) 108 (90 Base) MCG/ACT inhaler Inhale 2 puffs into the lungs every 4 (four) hours as needed for wheezing or shortness of breath. 1 Inhaler 0  . allopurinol (ZYLOPRIM) 300 MG tablet Take 1  tablet (300 mg total) by mouth daily. 90 tablet 3  . amLODipine (NORVASC) 10 MG tablet TAKE 1 TABLET BY MOUTH ONCE A DAY 90 tablet 3  . aspirin 81 MG tablet Take 81 mg by mouth daily.    Marland Kitchen atorvastatin (LIPITOR) 20 MG tablet TAKE 1 TABLET BY MOUTH EVERY DAY 90 tablet 2  . beclomethasone (QVAR REDIHALER) 80 MCG/ACT inhaler USE ONE PUFF TWICE A DAY TO PREVENT COUGH OR WHEEZING. RINSE, GARGLE, AND SPIT AFTER USE 10.6 g 11  . carvedilol (COREG) 6.25 MG tablet Take 6.25 mg by mouth 2 (two) times daily with a meal.    . cetirizine (ZYRTEC) 10 MG tablet Take 1 tablet (10 mg total) by mouth daily. 14 tablet 0  . Cholecalciferol (VITAMIN D3 PO) Take 1 tablet by mouth daily.    . cyclobenzaprine (FLEXERIL) 10 MG tablet Take 1 tablet (10 mg total) by mouth 3 (three) times daily as needed for muscle spasms. 20 tablet 0  . dapagliflozin propanediol (FARXIGA) 5 MG TABS tablet Take 1 tablet (5 mg total) by mouth daily. GFR 30 tablet 3  . famotidine (PEPCID) 20 MG tablet Take 20 mg by mouth at bedtime.    . fexofenadine (ALLEGRA) 180 MG tablet Take 180 mg by mouth daily.     Marland Kitchen FLUoxetine (PROZAC) 20 MG capsule TAKE 1 CAPSULE BY MOUTH EVERY DAY 90 capsule 1  . furosemide (LASIX)  20 MG tablet Take 1 tablet (20 mg total) by mouth daily. TAKE 1 TABLET BY MOUTH ONCE A DAY 90 tablet 1  . irbesartan (AVAPRO) 300 MG tablet TAKE 1 TABLET BY MOUTH EVERY DAY 90 tablet 1  . Probiotic Product (PROBIOTIC PO) Take 1 tablet by mouth daily.    . sodium chloride (OCEAN) 0.65 % SOLN nasal spray Place 1 spray into both nostrils as needed for congestion. 15 mL 0  . carvedilol (COREG) 3.125 MG tablet TAKE 1 TABLET (3.125 MG TOTAL) BY MOUTH TWO (2) TIMES A DAY. 180 tablet 1   No facility-administered medications prior to visit.     Per HPI unless specifically indicated in ROS section below Review of Systems  Constitutional: Negative for fatigue and fever.  HENT: Negative for ear pain.   Eyes: Negative for pain.  Respiratory:  Negative for cough and shortness of breath.   Cardiovascular: Negative for chest pain, palpitations and leg swelling.  Gastrointestinal: Negative for abdominal pain.  Genitourinary: Negative for dysuria.  Musculoskeletal: Negative for arthralgias.  Neurological: Negative for syncope, light-headedness and headaches.  Psychiatric/Behavioral: Negative for dysphoric mood.   Objective:  BP 102/66   Pulse 72   Temp 97.8 F (36.6 C) (Temporal)   Ht 5\' 11"  (1.803 m)   Wt (!) 316 lb 8 oz (143.6 kg)   SpO2 95%   BMI 44.14 kg/m   Wt Readings from Last 3 Encounters:  05/09/20 (!) 316 lb 8 oz (143.6 kg)  02/13/20 (!) 319 lb (144.7 kg)  09/26/19 (!) 308 lb 8 oz (139.9 kg)      Physical Exam Constitutional:      Appearance: He is well-developed. He is obese.  HENT:     Head: Normocephalic.     Right Ear: Hearing normal.     Left Ear: Hearing normal.     Nose: Nose normal.  Neck:     Thyroid: No thyroid mass or thyromegaly.     Vascular: No carotid bruit.     Trachea: Trachea normal.  Cardiovascular:     Rate and Rhythm: Normal rate and regular rhythm.     Pulses: Normal pulses.     Heart sounds: Heart sounds not distant. No murmur heard. No friction rub. No gallop.      Comments: No peripheral edema Pulmonary:     Effort: Pulmonary effort is normal. No respiratory distress.     Breath sounds: Normal breath sounds.  Skin:    General: Skin is warm and dry.     Findings: No rash.  Psychiatric:        Speech: Speech normal.        Behavior: Behavior normal.        Thought Content: Thought content normal.       Results for orders placed or performed in visit on 02/13/20  POCT glycosylated hemoglobin (Hb A1C)  Result Value Ref Range   Hemoglobin A1C 7.7 (A) 4.0 - 5.6 %   HbA1c POC (<> result, manual entry)     HbA1c, POC (prediabetic range)     HbA1c, POC (controlled diabetic range)      This visit occurred during the SARS-CoV-2 public health emergency.  Safety protocols  were in place, including screening questions prior to the visit, additional usage of staff PPE, and extensive cleaning of exam room while observing appropriate contact time as indicated for disinfecting solutions.   COVID 19 screen:  No recent travel or known exposure to COVID19 The patient denies respiratory  symptoms of COVID 19 at this time. The importance of social distancing was discussed today.   Assessment and Plan Problem List Items Addressed This Visit    CKD (chronic kidney disease), stage III ([place])     Followed by Dr. 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 Reviewed healthy lifestyle changes, low carb diet, increase in exercise and weight loss. Given prescription for glucometer and supplies to start checking blood sugar daily.  Reviewed expected  CBGs.  Pt wishes to continue [place] despite elevated cost given renal benefit. We will try a higher dose, but if not improving in next few week... will consider other options.  I will also have [facility] pharmacist unofficially review his chart and see if there are ways to reduce Wilder Glade cost or get GLP1 cost down.       Relevant Medications   dapagliflozin propanediol (FARXIGA) 10 MG TABS tablet   Hyperlipidemia associated with type 2 diabetes mellitus (HCC)    Stable, chronic.  Continue current medication.   atorvastatin 20 mg daily       Relevant Medications   dapagliflozin propanediol (FARXIGA) 10 MG TABS tablet   Hypertension associated with diabetes ([place])    Good control on  Amlodipine and lower dose Coreg.      Relevant Medications   carvedilol (COREG) 6.25 MG tablet   dapagliflozin propanediol (FARXIGA) 10 MG TABS tablet   Snoring - Primary    Refer for sleep apnea evaluation. Encouraged exercise, weight loss, healthy eating habits.       Relevant Orders   Ambulatory referral to Pulmonology         Eliezer Lofts, MD

## 2020-05-09 NOTE — Assessment & Plan Note (Signed)
Inadequate worsening control. Reviewed healthy lifestyle changes, low carb diet, increase in exercise and weight loss. Given prescription for glucometer and supplies to start checking blood sugar daily.  Reviewed expected  CBGs.  Pt wishes to continue [place] despite elevated cost given renal benefit. We will try a higher dose, but if not improving in next few week... will consider other options.  I will also have [facility] pharmacist unofficially review his chart and see if there are ways to reduce Wilder Glade cost or get GLP1 cost down.

## 2020-05-13 ENCOUNTER — Telehealth: Payer: Self-pay

## 2020-05-13 NOTE — Telephone Encounter (Signed)
Richard Giles with [facility] requesting date that rx for glucometer and diabetic testing supplies were written. Per 05/09/20 office note date written was 05/09/20. Nothing further needed.

## 2020-07-09 ENCOUNTER — Other Ambulatory Visit: Payer: Self-pay | Admitting: Family Medicine

## 2020-07-09 NOTE — Telephone Encounter (Signed)
Pharmacy requests refill on: Fluoxetine 20 mg   LAST REFILL: 01/15/2020 (Q-90, R-1) LAST OV: 05/09/2020 NEXT OV: 08/20/2020 PHARMACY: [id number] [place], [place]

## 2020-07-15 ENCOUNTER — Other Ambulatory Visit: Payer: Self-pay | Admitting: Family Medicine

## 2020-08-02 ENCOUNTER — Telehealth: Payer: Self-pay | Admitting: Family Medicine

## 2020-08-02 DIAGNOSIS — Z125 Encounter for screening for malignant neoplasm of prostate: Secondary | ICD-10-CM

## 2020-08-02 DIAGNOSIS — E119 Type 2 diabetes mellitus without complications: Secondary | ICD-10-CM

## 2020-08-02 DIAGNOSIS — M109 Gout, unspecified: Secondary | ICD-10-CM

## 2020-08-02 NOTE — Telephone Encounter (Signed)
-----   Message from Cloyd Stagers, RT sent at 07/29/2020  1:51 PM EDT ----- Regarding: Lab Orders for Tuesday 6.21.2022 Please place lab orders for Tuesday 6.21.2022, office visit for physical on Tuesday 6.28.2022 Thank you, Dyke Maes RT(R)

## 2020-08-12 ENCOUNTER — Other Ambulatory Visit (INDEPENDENT_AMBULATORY_CARE_PROVIDER_SITE_OTHER): Payer: BC Managed Care – PPO

## 2020-08-12 ENCOUNTER — Other Ambulatory Visit: Payer: Self-pay

## 2020-08-12 DIAGNOSIS — Z125 Encounter for screening for malignant neoplasm of prostate: Secondary | ICD-10-CM | POA: Diagnosis not present

## 2020-08-12 DIAGNOSIS — E119 Type 2 diabetes mellitus without complications: Secondary | ICD-10-CM | POA: Diagnosis not present

## 2020-08-12 DIAGNOSIS — M109 Gout, unspecified: Secondary | ICD-10-CM | POA: Diagnosis not present

## 2020-08-12 LAB — LIPID PANEL
Cholesterol: 153 mg/dL (ref 0–200)
HDL: 33.8 mg/dL — ABNORMAL LOW (ref >39.00)
NonHDL: 119.1
Total CHOL/HDL Ratio: 5
Triglycerides: 233 mg/dL — ABNORMAL HIGH (ref 0.0–149.0)
VLDL: 46.6 mg/dL — ABNORMAL HIGH (ref 0.0–40.0)

## 2020-08-12 LAB — COMPREHENSIVE METABOLIC PANEL
ALT: 14 U/L (ref 0–53)
AST: 13 U/L (ref 0–37)
Albumin: 4.2 g/dL (ref 3.5–5.2)
Alkaline Phosphatase: 130 U/L — ABNORMAL HIGH (ref 39–117)
BUN: 31 mg/dL — ABNORMAL HIGH (ref 6–23)
CO2: 27 mEq/L (ref 19–32)
Calcium: 9.7 mg/dL (ref 8.4–10.5)
Chloride: 102 mEq/L (ref 96–112)
Creatinine, Ser: 2.29 mg/dL — ABNORMAL HIGH (ref 0.40–1.50)
GFR: 30.46 mL/min — ABNORMAL LOW (ref >60.00)
Glucose, Bld: 151 mg/dL — ABNORMAL HIGH (ref 70–99)
Potassium: 5.4 mEq/L — ABNORMAL HIGH (ref 3.5–5.1)
Sodium: 138 mEq/L (ref 135–145)
Total Bilirubin: 0.6 mg/dL (ref 0.2–1.2)
Total Protein: 6.7 g/dL (ref 6.0–8.3)

## 2020-08-12 LAB — PSA: PSA: 0.4 ng/mL (ref 0.10–4.00)

## 2020-08-12 LAB — HEMOGLOBIN A1C: Hgb A1c MFr Bld: 8.1 % — ABNORMAL HIGH (ref 4.6–6.5)

## 2020-08-12 LAB — URIC ACID: Uric Acid, Serum: 6.3 mg/dL (ref 4.0–7.8)

## 2020-08-12 LAB — LDL CHOLESTEROL, DIRECT: Direct LDL: 85 mg/dL

## 2020-08-13 ENCOUNTER — Other Ambulatory Visit: Payer: BC Managed Care – PPO

## 2020-08-14 ENCOUNTER — Other Ambulatory Visit: Payer: Self-pay | Admitting: Family Medicine

## 2020-08-20 ENCOUNTER — Other Ambulatory Visit: Payer: Self-pay

## 2020-08-20 ENCOUNTER — Ambulatory Visit (INDEPENDENT_AMBULATORY_CARE_PROVIDER_SITE_OTHER): Payer: BC Managed Care – PPO | Admitting: Family Medicine

## 2020-08-20 VITALS — BP 100/70 | HR 76 | Temp 98.2°F | Ht 70.75 in | Wt 311.5 lb

## 2020-08-20 DIAGNOSIS — Z0000 Encounter for general adult medical examination without abnormal findings: Secondary | ICD-10-CM | POA: Diagnosis not present

## 2020-08-20 DIAGNOSIS — N1831 Chronic kidney disease, stage 3a: Secondary | ICD-10-CM | POA: Diagnosis not present

## 2020-08-20 DIAGNOSIS — M109 Gout, unspecified: Secondary | ICD-10-CM

## 2020-08-20 DIAGNOSIS — E1169 Type 2 diabetes mellitus with other specified complication: Secondary | ICD-10-CM

## 2020-08-20 DIAGNOSIS — E119 Type 2 diabetes mellitus without complications: Secondary | ICD-10-CM

## 2020-08-20 DIAGNOSIS — E1159 Type 2 diabetes mellitus with other circulatory complications: Secondary | ICD-10-CM

## 2020-08-20 DIAGNOSIS — E875 Hyperkalemia: Secondary | ICD-10-CM

## 2020-08-20 DIAGNOSIS — N02B9 Other recurrent and persistent immunoglobulin A nephropathy: Secondary | ICD-10-CM

## 2020-08-20 DIAGNOSIS — I152 Hypertension secondary to endocrine disorders: Secondary | ICD-10-CM

## 2020-08-20 DIAGNOSIS — E785 Hyperlipidemia, unspecified: Secondary | ICD-10-CM

## 2020-08-20 DIAGNOSIS — J42 Unspecified chronic bronchitis: Secondary | ICD-10-CM

## 2020-08-20 DIAGNOSIS — N028 Recurrent and persistent hematuria with other morphologic changes: Secondary | ICD-10-CM

## 2020-08-20 LAB — POTASSIUM: Potassium: 4.6 mEq/L (ref 3.5–5.1)

## 2020-08-20 MED ORDER — GLIPIZIDE ER 10 MG PO TB24: 10.0000 mg | ORAL_TABLET | Freq: Every day | ORAL | 11 refills | Status: DC

## 2020-08-20 MED ORDER — GLIPIZIDE ER 5 MG PO TB24: 5.0000 mg | ORAL_TABLET | Freq: Every day | ORAL | 11 refills | Status: DC

## 2020-08-20 NOTE — Assessment & Plan Note (Signed)
Continue farxiga.Marland Kitchen add glucotrol XL 5 mg daily... follow Blood sugars.Marland Kitchen watch for lows.

## 2020-08-20 NOTE — Assessment & Plan Note (Signed)
stable on Qvar daily.. using albuterol prn.

## 2020-08-20 NOTE — Progress Notes (Signed)
Patient ID: Richard Giles, male    DOB: [birth year], 60 y.o.   MRN: [id number]  This visit was conducted in person.  BP 100/70   Pulse 76   Temp 98.2 F (36.8 C) (Temporal)   Ht 5' 10.75" (1.797 m)   Wt (!) 311 lb 8 oz (141.3 kg)   SpO2 96%   BMI 43.75 kg/m    CC:  Chief Complaint  Patient presents with   Annual Exam    Subjective:   HPI: Richard Giles is a 60 y.o. male presenting on 08/20/2020 for Annual Exam  CKD Estimated Creatinine Clearance: 49.8 mL/min (A) (by C-G formula based on SCr of 2.29 mg/dL (H)). Ig A nephropathy.. followed by [facility]   High potassium... has now been watching diet.  Diabetes: Poor control. Wilder Glade recommended given renal benefit.. he is on max 10 mg daily. Lab Results  Component Value Date   HGBA1C 8.1 (H) 08/12/2020  Using medications without difficulties: Hypoglycemic episodes: Hyperglycemic episodes: Feet problems: Blood Sugars averaging: not checking eye exam within last year: yes. Wt Readings from Last 3 Encounters:  08/20/20 (!) 311 lb 8 oz (141.3 kg)  05/09/20 (!) 316 lb 8 oz (143.6 kg)  02/13/20 (!) 319 lb (144.7 kg)  Body mass index is 43.75 kg/m.   Hypertension:   At goal on amlodipine 10 mg daily, coreg 6.25 mg BID, avapro 300 mg daily, lasix prn BP Readings from Last 3 Encounters:  08/20/20 100/70  05/09/20 102/66  02/13/20 140/80  Using medication without problems or lightheadedness:  none Chest pain with exertion: none Edema:none Short of breath stable Average home BPs: Other issues:   Chronic bronchitis history: stable on Qvar daily.. using albuterol prn.  Elevated Cholesterol: LDL: almost at goal < 70 on atorvastatin 20 mg daily. Lab Results  Component Value Date   CHOL 153 08/12/2020   HDL 33.80 (L) 08/12/2020   LDLCALC 178 (H) 07/03/2013   LDLDIRECT 85.0 08/12/2020   TRIG 233.0 (H) 08/12/2020   CHOLHDL 5 08/12/2020  Using medications without problems: Muscle aches:  Diet compliance: working on low  carb Exercise: increase Other complaints:    Gout: stable on allopurinol.. no flares   in last 3 month.. better  after taking  extra 100 mg for several weeks. Now back on 300 mg daily. Lab Results  Component Value Date   LABURIC 6.3 08/12/2020        Relevant past medical, surgical, family and social history reviewed and updated as indicated. Interim medical history since our last visit reviewed. Allergies and medications reviewed and updated. Outpatient Medications Prior to Visit  Medication Sig Dispense Refill   ACCU-CHEK GUIDE test strip TEST BLOOD SUGAR EVERY DAY 100 strip 3   Accu-Chek Softclix Lancets lancets TEST BLOOD SUGAR ONCE DAILY     acetaminophen (TYLENOL) 500 MG tablet Take 1,000 mg by mouth every 6 (six) hours as needed (pain).     albuterol (PROVENTIL HFA;VENTOLIN HFA) 108 (90 Base) MCG/ACT inhaler Inhale 2 puffs into the lungs every 4 (four) hours as needed for wheezing or shortness of breath. 1 Inhaler 0   allopurinol (ZYLOPRIM) 300 MG tablet TAKE 1 TABLET BY MOUTH EVERY DAY 90 tablet 0   amLODipine (NORVASC) 10 MG tablet TAKE 1 TABLET BY MOUTH ONCE A DAY 90 tablet 3   aspirin 81 MG tablet Take 81 mg by mouth daily.     atorvastatin (LIPITOR) 20 MG tablet TAKE 1 TABLET BY MOUTH EVERY DAY 90  tablet 2   beclomethasone (QVAR REDIHALER) 80 MCG/ACT inhaler USE ONE PUFF TWICE A DAY TO PREVENT COUGH OR WHEEZING. RINSE, GARGLE, AND SPIT AFTER USE 10.6 g 11   Blood Glucose Monitoring Suppl (ACCU-CHEK GUIDE ME) w/Device KIT daily.     carvedilol (COREG) 6.25 MG tablet Take 6.25 mg by mouth 2 (two) times daily with a meal.     cetirizine (ZYRTEC) 10 MG tablet Take 1 tablet (10 mg total) by mouth daily. 14 tablet 0   Cholecalciferol (VITAMIN D3 PO) Take 1 tablet by mouth daily.     cyclobenzaprine (FLEXERIL) 10 MG tablet Take 1 tablet (10 mg total) by mouth 3 (three) times daily as needed for muscle spasms. 20 tablet 0   dapagliflozin propanediol (FARXIGA) 10 MG TABS tablet  Take 1 tablet (10 mg total) by mouth daily. GFR 90 tablet 3   famotidine (PEPCID) 20 MG tablet Take 20 mg by mouth at bedtime.     fexofenadine (ALLEGRA) 180 MG tablet Take 180 mg by mouth daily.      FLUoxetine (PROZAC) 20 MG capsule TAKE 1 CAPSULE BY MOUTH EVERY DAY 90 capsule 1   furosemide (LASIX) 20 MG tablet Take 1 tablet (20 mg total) by mouth daily. TAKE 1 TABLET BY MOUTH ONCE A DAY 90 tablet 1   irbesartan (AVAPRO) 300 MG tablet TAKE 1 TABLET BY MOUTH EVERY DAY 90 tablet 1   Probiotic Product (PROBIOTIC PO) Take 1 tablet by mouth daily.     sodium chloride (OCEAN) 0.65 % SOLN nasal spray Place 1 spray into both nostrils as needed for congestion. 15 mL 0   No facility-administered medications prior to visit.     Per HPI unless specifically indicated in ROS section below Review of Systems Objective:  BP 100/70   Pulse 76   Temp 98.2 F (36.8 C) (Temporal)   Ht 5' 10.75" (1.797 m)   Wt (!) 311 lb 8 oz (141.3 kg)   SpO2 96%   BMI 43.75 kg/m   Wt Readings from Last 3 Encounters:  08/20/20 (!) 311 lb 8 oz (141.3 kg)  05/09/20 (!) 316 lb 8 oz (143.6 kg)  02/13/20 (!) 319 lb (144.7 kg)      Physical Exam    Results for orders placed or performed in visit on 08/12/20  Uric acid  Result Value Ref Range   Uric Acid, Serum 6.3 4.0 - 7.8 mg/dL  Comprehensive metabolic panel  Result Value Ref Range   Sodium 138 135 - 145 mEq/L   Potassium 5.4 No hemolysis seen (H) 3.5 - 5.1 mEq/L   Chloride 102 96 - 112 mEq/L   CO2 27 19 - 32 mEq/L   Glucose, Bld 151 (H) 70 - 99 mg/dL   BUN 31 (H) 6 - 23 mg/dL   Creatinine, Ser 2.29 (H) 0.40 - 1.50 mg/dL   Total Bilirubin 0.6 0.2 - 1.2 mg/dL   Alkaline Phosphatase 130 (H) 39 - 117 U/L   AST 13 0 - 37 U/L   ALT 14 0 - 53 U/L   Total Protein 6.7 6.0 - 8.3 g/dL   Albumin 4.2 3.5 - 5.2 g/dL   GFR 30.46 (L) >60.00 mL/min   Calcium 9.7 8.4 - 10.5 mg/dL  Lipid panel  Result Value Ref Range   Cholesterol 153 0 - 200 mg/dL   Triglycerides  233.0 (H) 0.0 - 149.0 mg/dL   HDL 33.80 (L) >39.00 mg/dL   VLDL 46.6 (H) 0.0 - 40.0 mg/dL  Total CHOL/HDL Ratio 5    NonHDL 119.10   Hemoglobin A1c  Result Value Ref Range   Hgb A1c MFr Bld 8.1 (H) 4.6 - 6.5 %  PSA  Result Value Ref Range   PSA 0.40 0.10 - 4.00 ng/mL  LDL cholesterol, direct  Result Value Ref Range   Direct LDL 85.0 mg/dL    This visit occurred during the SARS-CoV-2 public health emergency.  Safety protocols were in place, including screening questions prior to the visit, additional usage of staff PPE, and extensive cleaning of exam room while observing appropriate contact time as indicated for disinfecting solutions.   COVID 19 screen:  No recent travel or known exposure to COVID19 The patient denies respiratory symptoms of COVID 19 at this time. The importance of social distancing was discussed today.   Assessment and Plan The patient's preventative maintenance and recommended screening tests for an annual wellness exam were reviewed in full today. Brought up to date unless services declined.  Counselled on the importance of diet, exercise, and its role in overall health and mortality. The patient's FH and SH was reviewed, including their home life, tobacco status, and drug and alcohol status.      Vaccines: 3 COVID vaccines, uptodate with PNA , shingrix. Prostate Cancer Screen:  Lab Results  Component Value Date   PSA 0.40 08/12/2020   PSA 0.47 02/21/2018   PSA 0.63 12/13/2015    Colon Cancer Screen: 09/2018.. 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 using albuterol prn.       CKD (chronic kidney disease), stage III ([place])     Followed by renal.. stable.       DM type 2 (diabetes mellitus, type 2) ([place])     Continue farxiga.Marland Kitchen add  glucotrol XL 5 mg daily... follow Blood sugars.Marland Kitchen watch for lows.       Relevant Medications   glipiZIDE (GLUCOTROL XL) 5 MG 24 hr tablet   GOUT    No flares since uric acid lower after few weeks of additional 100 mg allopurinol.  Dose limited with CKD.  Continue  300 mg daily of allopurinol.       Hyperlipidemia associated with type 2 diabetes mellitus ([place])    Almost at goal < 70 on atorvastatin 20 mg daily. Encouraged exercise, weight loss, healthy eating habits.        Relevant Medications   glipiZIDE (GLUCOTROL XL) 5 MG 24 hr tablet   Hypertension associated with diabetes ([place])    At goal on amlodipine 10 mg daily, coreg 6.25 mg BID, avapro 300 mg daily, lasix prn       Relevant Medications   glipiZIDE (GLUCOTROL XL) 5 MG 24 hr tablet   Morbid obesity (HCC)   Relevant Medications   glipiZIDE (GLUCOTROL XL) 5 MG 24 hr tablet   Other Visit Diagnoses     Routine general medical examination at a health care facility    -  Primary   Hyperkalemia       Relevant Orders   Potassium        Eliezer Lofts, MD

## 2020-08-20 NOTE — Assessment & Plan Note (Addendum)
No flares since uric acid lower after few weeks of additional 100 mg allopurinol.  Dose limited with CKD.  Continue  300 mg daily of allopurinol.

## 2020-08-20 NOTE — Patient Instructions (Addendum)
Continue  300 mg daily of allopurinol. Please stop at the lab to have labs drawn. Continue low potassium diet and healthy lifestyle. Start glucotrol  XL 5 mg daily.. do not skip meals.  Check blood sugar daily. Goal fasting blood sugar < 120.

## 2020-08-20 NOTE — Assessment & Plan Note (Signed)
At goal on amlodipine 10 mg daily, coreg 6.25 mg BID, avapro 300 mg daily, lasix prn

## 2020-08-20 NOTE — Assessment & Plan Note (Signed)
Followed by renal.. stable.

## 2020-08-20 NOTE — Assessment & Plan Note (Signed)
Almost at goal < 70 on atorvastatin 20 mg daily. Encouraged exercise, weight loss, healthy eating habits.

## 2020-08-30 ENCOUNTER — Other Ambulatory Visit: Payer: Self-pay | Admitting: Family Medicine

## 2020-09-12 ENCOUNTER — Other Ambulatory Visit: Payer: Self-pay | Admitting: Family Medicine

## 2020-09-14 ENCOUNTER — Other Ambulatory Visit: Payer: Self-pay | Admitting: Family Medicine

## 2020-09-14 DIAGNOSIS — J069 Acute upper respiratory infection, unspecified: Secondary | ICD-10-CM

## 2020-10-01 ENCOUNTER — Other Ambulatory Visit: Payer: Self-pay | Admitting: Family Medicine

## 2020-10-20 ENCOUNTER — Other Ambulatory Visit: Payer: Self-pay | Admitting: Family Medicine

## 2020-11-01 ENCOUNTER — Telehealth: Payer: Self-pay | Admitting: Family Medicine

## 2020-11-01 DIAGNOSIS — E119 Type 2 diabetes mellitus without complications: Secondary | ICD-10-CM

## 2020-11-01 DIAGNOSIS — Z125 Encounter for screening for malignant neoplasm of prostate: Secondary | ICD-10-CM

## 2020-11-01 NOTE — Telephone Encounter (Signed)
-----   Message from Ellamae Sia sent at 10/29/2020 11:35 AM EDT ----- Regarding: Lab orders for Thursday, 9.22.22 Lab orders for a 3 month follow up appt.

## 2020-11-11 ENCOUNTER — Other Ambulatory Visit: Payer: Self-pay | Admitting: *Deleted

## 2020-11-11 MED ORDER — QVAR REDIHALER 80 MCG/ACT IN AERB: INHALATION_SPRAY | RESPIRATORY_TRACT | 5 refills | Status: DC

## 2020-11-14 ENCOUNTER — Other Ambulatory Visit: Payer: BC Managed Care – PPO

## 2020-11-15 ENCOUNTER — Other Ambulatory Visit: Payer: Self-pay

## 2020-11-15 ENCOUNTER — Other Ambulatory Visit (INDEPENDENT_AMBULATORY_CARE_PROVIDER_SITE_OTHER): Payer: BC Managed Care – PPO

## 2020-11-15 DIAGNOSIS — E119 Type 2 diabetes mellitus without complications: Secondary | ICD-10-CM

## 2020-11-15 LAB — LIPID PANEL
Cholesterol: 154 mg/dL (ref 0–200)
HDL: 42.1 mg/dL (ref >39.00)
LDL Cholesterol: 87 mg/dL (ref 0–99)
NonHDL: 111.93
Total CHOL/HDL Ratio: 4
Triglycerides: 124 mg/dL (ref 0.0–149.0)
VLDL: 24.8 mg/dL (ref 0.0–40.0)

## 2020-11-15 LAB — COMPREHENSIVE METABOLIC PANEL
ALT: 13 U/L (ref 0–53)
AST: 14 U/L (ref 0–37)
Albumin: 4 g/dL (ref 3.5–5.2)
Alkaline Phosphatase: 127 U/L — ABNORMAL HIGH (ref 39–117)
BUN: 39 mg/dL — ABNORMAL HIGH (ref 6–23)
CO2: 29 mEq/L (ref 19–32)
Calcium: 9.4 mg/dL (ref 8.4–10.5)
Chloride: 101 mEq/L (ref 96–112)
Creatinine, Ser: 2.33 mg/dL — ABNORMAL HIGH (ref 0.40–1.50)
GFR: 29.78 mL/min — ABNORMAL LOW (ref >60.00)
Glucose, Bld: 138 mg/dL — ABNORMAL HIGH (ref 70–99)
Potassium: 4.9 mEq/L (ref 3.5–5.1)
Sodium: 139 mEq/L (ref 135–145)
Total Bilirubin: 0.4 mg/dL (ref 0.2–1.2)
Total Protein: 6.6 g/dL (ref 6.0–8.3)

## 2020-11-15 LAB — HEMOGLOBIN A1C: Hgb A1c MFr Bld: 7.3 % — ABNORMAL HIGH (ref 4.6–6.5)

## 2020-11-19 ENCOUNTER — Telehealth (INDEPENDENT_AMBULATORY_CARE_PROVIDER_SITE_OTHER): Payer: BC Managed Care – PPO | Admitting: Family Medicine

## 2020-11-19 ENCOUNTER — Other Ambulatory Visit: Payer: Self-pay

## 2020-11-19 ENCOUNTER — Encounter: Payer: Self-pay | Admitting: Family Medicine

## 2020-11-19 VITALS — Ht 70.25 in | Wt 310.0 lb

## 2020-11-19 DIAGNOSIS — J4541 Moderate persistent asthma with (acute) exacerbation: Secondary | ICD-10-CM | POA: Insufficient documentation

## 2020-11-19 DIAGNOSIS — J069 Acute upper respiratory infection, unspecified: Secondary | ICD-10-CM | POA: Diagnosis not present

## 2020-11-19 DIAGNOSIS — J4540 Moderate persistent asthma, uncomplicated: Secondary | ICD-10-CM | POA: Insufficient documentation

## 2020-11-19 MED ORDER — AZITHROMYCIN 250 MG PO TABS: ORAL_TABLET | ORAL | 0 refills | Status: DC

## 2020-11-19 MED ORDER — ALBUTEROL SULFATE HFA 108 (90 BASE) MCG/ACT IN AERS: 2.0000 | INHALATION_SPRAY | RESPIRATORY_TRACT | 3 refills | Status: AC | PRN

## 2020-11-19 MED ORDER — ALBUTEROL SULFATE HFA 108 (90 BASE) MCG/ACT IN AERS: 2.0000 | INHALATION_SPRAY | RESPIRATORY_TRACT | 0 refills | Status: DC | PRN

## 2020-11-19 MED ORDER — PREDNISONE 20 MG PO TABS: ORAL_TABLET | ORAL | 0 refills | Status: DC

## 2020-11-19 NOTE — Assessment & Plan Note (Signed)
Increase Qvar to 2 puff in AM and 1 puff at night.  Complete prednisone  long taper.  Use albuterol prn every 4 hours for SOb or wheeze  Complete azithromycin to cover for bacterial infection.  Change Zyrtec to [facility] for allergy coverage.  Take COVID test.. keep follow up appt.

## 2020-11-19 NOTE — Progress Notes (Signed)
VIRTUAL VISIT Due to national recommendations of social distancing due to [place] 19, a virtual visit is felt to be most appropriate for this patient at this time.   I connected with the patient on 11/19/20 at  3:00 PM EDT by virtual telehealth platform and verified that I am speaking with the correct person using two identifiers.   I discussed the limitations, risks, security and privacy concerns of performing an evaluation and management service by  virtual telehealth platform and the availability of in person appointments. I also discussed with the patient that there may be a patient responsible charge related to this service. The patient expressed understanding and agreed to proceed.  Patient location: Home Provider Location: [place] Participants: Eliezer Lofts and Marikay Alar   Chief Complaint  Patient presents with   Cough   Nasal Congestion    History of Present Illness:  60 year old male with history of DM, HTN, chronic bronchitis, and cough variant asthma presents with 2 weeks of cough.  Started with  dry cough, low grade temp and congestion.  Symptoms progressed to productive cough. 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  He took prednisone 60, 40 taper 5 day course and delsym... has not helped.   Using QVar  80 mcg 1 puff twice daily.  He does not have any albuterol.    He reports COVID 19 screen COVID testing:not tested COVID vaccine: 4 doses. COVID exposure: No recent travel or known exposure to [place]  The importance of social distancing was discussed today.    Review of Systems  Constitutional:  Negative for chills and fever.  HENT:  Negative for congestion and ear pain.   Eyes:  Negative for pain and redness.  Respiratory:  Positive for cough, shortness of breath and wheezing.   Cardiovascular:  Negative for chest pain, palpitations and leg swelling.  Gastrointestinal:  Negative for abdominal pain, blood in stool,  constipation, diarrhea, nausea and vomiting.  Genitourinary:  Negative for dysuria.  Musculoskeletal:  Negative for falls and myalgias.  Skin:  Negative for rash.  Neurological:  Negative for dizziness.  Psychiatric/Behavioral:  Negative for depression. The patient is not nervous/anxious.      Past Medical History:  Diagnosis Date   Allergy    allergy workup with Dr. 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 He quit smokeless tobacco use about 27 years ago. He reports that he does not drink alcohol and does not use drugs.   Current Outpatient Medications:    ACCU-CHEK GUIDE test strip, TEST BLOOD SUGAR EVERY DAY, Disp: 100 strip, Rfl: 3   Accu-Chek Softclix Lancets lancets, TEST BLOOD SUGAR ONCE DAILY, Disp: , Rfl:    acetaminophen (TYLENOL) 500 MG tablet, Take 1,000 mg by mouth every 6 (six) hours as needed (  pain)., Disp: , Rfl:    albuterol (PROVENTIL HFA;VENTOLIN HFA) 108 (90 Base) MCG/ACT inhaler, Inhale 2 puffs into the lungs every 4 (four) hours as needed for wheezing or shortness of breath., Disp: 1 Inhaler, Rfl: 0   allopurinol (ZYLOPRIM) 300 MG tablet, TAKE 1 TABLET BY MOUTH EVERY DAY, Disp: 90 tablet, Rfl: 3   amLODipine (NORVASC) 10 MG tablet, TAKE 1 TABLET BY MOUTH ONCE A DAY, Disp: 90 tablet, Rfl: 3   aspirin 81 MG tablet, Take 81 mg by mouth daily., Disp: , Rfl:    atorvastatin (LIPITOR) 20 MG tablet, TAKE 1 TABLET BY  MOUTH EVERY DAY, Disp: 90 tablet, Rfl: 3   beclomethasone (QVAR REDIHALER) 80 MCG/ACT inhaler, USE ONE PUFF TWICE A DAY TO PREVENT COUGH OR WHEEZING. RINSE, GARGLE, AND SPIT AFTER USE, Disp: 10.6 g, Rfl: 5   Blood Glucose Monitoring Suppl (ACCU-CHEK GUIDE ME) w/Device KIT, daily., Disp: , Rfl:    carvedilol (COREG) 6.25 MG tablet, Take 6.25 mg by mouth 2 (two) times daily with a meal., Disp: , Rfl:    cetirizine (ZYRTEC) 10 MG tablet, Take 1 tablet (10 mg total) by mouth daily., Disp: 14 tablet, Rfl: 0   Cholecalciferol (VITAMIN D3 PO), Take 1 tablet by mouth daily., Disp: , Rfl:    cyclobenzaprine (FLEXERIL) 10 MG tablet, Take 1 tablet (10 mg total) by mouth 3 (three) times daily as needed for muscle spasms., Disp: 20 tablet, Rfl: 0   dapagliflozin propanediol (FARXIGA) 10 MG TABS tablet, Take 1 tablet (10 mg total) by mouth daily. GFR, Disp: 90 tablet, Rfl: 3   famotidine (PEPCID) 20 MG tablet, Take 20 mg by mouth at bedtime., Disp: , Rfl:    fexofenadine (ALLEGRA) 180 MG tablet, Take 180 mg by mouth daily. , Disp: , Rfl:    FLUoxetine (PROZAC) 20 MG capsule, TAKE 1 CAPSULE BY MOUTH EVERY DAY, Disp: 90 capsule, Rfl: 1   furosemide (LASIX) 20 MG tablet, Take 1 tablet (20 mg total) by mouth daily as needed., Disp: 90 tablet, Rfl: 1   glipiZIDE (GLUCOTROL XL) 5 MG 24 hr tablet, Take 1 tablet (5 mg total) by mouth daily with breakfast., Disp: 30 tablet, Rfl: 11   irbesartan (AVAPRO) 300 MG tablet, TAKE 1 TABLET BY MOUTH EVERY DAY, Disp: 90 tablet, Rfl: 3   Probiotic Product (PROBIOTIC PO), Take 1 tablet by mouth daily., Disp: , Rfl:    sodium chloride (OCEAN) 0.65 % SOLN nasal spray, Place 1 spray into both nostrils as needed for congestion., Disp: 15 mL, Rfl: 0   Observations/Objective: Height 5' 10.25" (1.784 m), weight (!) 310 lb (140.6 kg).  Physical Exam  Physical Exam Constitutional:      General: The patient is not in acute distress. Constant coughing. Pulmonary:     Effort: Pulmonary  effort is normal. No respiratory distress.  Neurological:     Mental Status: The patient is alert and oriented to person, place, and time.  Psychiatric:        Mood and Affect: Mood normal.        Behavior: Behavior normal.   Assessment and Plan Problem List Items Addressed This Visit     Moderate persistent asthma with exacerbation - Primary    Increase Qvar to 2 puff in AM and 1 puff at night.  Complete prednisone  long taper.  Use albuterol prn every 4 hours for SOb or wheeze  Complete azithromycin to cover for bacterial infection.  Change Zyrtec to [facility] for allergy coverage.  Take COVID test.. keep follow up appt.       Relevant Medications   predniSONE (DELTASONE) 20 MG tablet   albuterol (VENTOLIN HFA) 108 (90 Base) MCG/ACT inhaler   Other Visit Diagnoses     Acute upper respiratory infection       Relevant Medications   azithromycin (ZITHROMAX) 250 MG tablet   albuterol (VENTOLIN HFA) 108 (90 Base) MCG/ACT inhaler         I discussed the assessment and treatment plan with the patient. The patient was provided an opportunity to ask questions and all were answered. The patient agreed with the plan and demonstrated an understanding of the instructions.   The patient was advised to call back or seek an in-person evaluation if the symptoms worsen or if the condition fails to improve as anticipated.     Eliezer Lofts, MD

## 2020-11-19 NOTE — Patient Instructions (Addendum)
Increase Qvar to 2 puff in AM and 1 puff at night.  Complete prednisone  long taper.  Use albuterol prn every 4 hours for SOb or wheeze  Complete azithromycin.  Change Zyrtec to [facility] for allergy coverage.  Take COVID test.. keep follow up appt.

## 2020-11-21 ENCOUNTER — Ambulatory Visit (INDEPENDENT_AMBULATORY_CARE_PROVIDER_SITE_OTHER): Payer: BC Managed Care – PPO | Admitting: Family Medicine

## 2020-11-21 ENCOUNTER — Other Ambulatory Visit: Payer: Self-pay

## 2020-11-21 VITALS — BP 118/60 | HR 81 | Temp 97.4°F | Ht 70.75 in | Wt 327.1 lb

## 2020-11-21 DIAGNOSIS — J4541 Moderate persistent asthma with (acute) exacerbation: Secondary | ICD-10-CM

## 2020-11-21 DIAGNOSIS — E119 Type 2 diabetes mellitus without complications: Secondary | ICD-10-CM

## 2020-11-21 DIAGNOSIS — I10 Essential (primary) hypertension: Secondary | ICD-10-CM | POA: Diagnosis not present

## 2020-11-21 DIAGNOSIS — M109 Gout, unspecified: Secondary | ICD-10-CM | POA: Diagnosis not present

## 2020-11-21 DIAGNOSIS — E1159 Type 2 diabetes mellitus with other circulatory complications: Secondary | ICD-10-CM | POA: Diagnosis not present

## 2020-11-21 DIAGNOSIS — I152 Hypertension secondary to endocrine disorders: Secondary | ICD-10-CM

## 2020-11-21 DIAGNOSIS — E1169 Type 2 diabetes mellitus with other specified complication: Secondary | ICD-10-CM

## 2020-11-21 DIAGNOSIS — N183 Chronic kidney disease, stage 3 unspecified: Secondary | ICD-10-CM | POA: Diagnosis not present

## 2020-11-21 DIAGNOSIS — E785 Hyperlipidemia, unspecified: Secondary | ICD-10-CM

## 2020-11-21 MED ORDER — METHYLPREDNISOLONE ACETATE 80 MG/ML IJ SUSP
80.0000 mg | Freq: Once | INTRAMUSCULAR | Status: AC
  Administered 2020-11-21: 80 mg via INTRAMUSCULAR

## 2020-11-21 NOTE — Assessment & Plan Note (Signed)
Stable, chronic.  Continue current medication.    

## 2020-11-21 NOTE — Assessment & Plan Note (Addendum)
Expect increase with prednisone use. If fasting blood sugars are remaining > 120 consistently.. can increase glucotrol XL to 10 mg daily.May be able to decrease back down after asthma better as was improving, CBGs < 120 prior to current illness.

## 2020-11-21 NOTE — Patient Instructions (Addendum)
When feeling better .Marland Kitchen  Increase atorvastatin to 40 mg daily  Can also CoQ10 if having statin associated muscle ache. If fasting blood sugars are remaining > 120 consistently.. can increase glucotrol XL to 10 mg daily.  Call with update on symptoms Monday.  If severe shortness of breath.. go to ER.

## 2020-11-21 NOTE — Assessment & Plan Note (Signed)
Acute, improving but only 1-15% on day 2 of antibiotics and prednisone taper. Unable to given in office nebs.  Given DepoMedrol 80 mg IMx 1. Follow CBGs closely.

## 2020-11-21 NOTE — Assessment & Plan Note (Signed)
Not quite at goal  < 70.  Increase atorvastatin to 40 mg daily

## 2020-11-21 NOTE — Progress Notes (Signed)
Patient ID: Richard Giles, male    DOB: Jun 02, 1960, 60 y.o.   MRN: [id number]  This visit was conducted in person.  BP 118/60   Pulse 81   Temp (!) 97.4 F (36.3 C) (Temporal)   Ht 5' 10.75" (1.797 m)   Wt (!) 327 lb 1 oz (148.4 kg)   SpO2 95%   BMI 45.94 kg/m    CC: Chief Complaint  Patient presents with   Follow-up    3 m0- DM     Subjective:   HPI: Richard Giles is a 60 y.o. male presenting on 11/21/2020 for Follow-up (3 m0- DM )  Diabetes:   Improved control on farxiga and glucotrol XL Lab Results  Component Value Date   HGBA1C 7.3 (H) 11/15/2020  Using medications without difficulties: Hypoglycemic episodes:none Hyperglycemic episodes: none Feet problems: no ulcers Blood Sugars averaging: FBS 177 on prednisone eye exam within last year:  Hypertension:    BP Readings from Last 3 Encounters:  11/21/20 118/60  08/20/20 100/70  05/09/20 102/66  Using medication without problems or lightheadedness: none Chest pain with exertion:none Edema:none Short of breath: yes.. due to asthma exacerbation. Average home BPs: Other issues:     Elevated Cholesterol:  Almost at goal < 70 on atorvastatin 20 mg daily Lab Results  Component Value Date   CHOL 154 11/15/2020   HDL 42.10 11/15/2020   LDLCALC 87 11/15/2020   LDLDIRECT 85.0 08/12/2020   TRIG 124.0 11/15/2020   CHOLHDL 4 11/15/2020  Using medications without problems: Muscle aches:  Diet compliance: Exercise: Other complaints:   Asthma exacerbation:  Improving.   No new fever on antibiotics.   10-15 percent better in last 48 hours.   Relevant past medical, surgical, family and social history reviewed and updated as indicated. Interim medical history since our last visit reviewed. Allergies and medications reviewed and updated. Outpatient Medications Prior to Visit  Medication Sig Dispense Refill   ACCU-CHEK GUIDE test strip TEST BLOOD SUGAR EVERY DAY 100 strip 3   Accu-Chek Softclix Lancets lancets  TEST BLOOD SUGAR ONCE DAILY     acetaminophen (TYLENOL) 500 MG tablet Take 1,000 mg by mouth every 6 (six) hours as needed (pain).     albuterol (VENTOLIN HFA) 108 (90 Base) MCG/ACT inhaler Inhale 2 puffs into the lungs every 4 (four) hours as needed for wheezing or shortness of breath. 1 each 3   allopurinol (ZYLOPRIM) 300 MG tablet TAKE 1 TABLET BY MOUTH EVERY DAY 90 tablet 3   amLODipine (NORVASC) 10 MG tablet TAKE 1 TABLET BY MOUTH ONCE A DAY 90 tablet 3   aspirin 81 MG tablet Take 81 mg by mouth daily.     atorvastatin (LIPITOR) 20 MG tablet TAKE 1 TABLET BY MOUTH EVERY DAY 90 tablet 3   azithromycin (ZITHROMAX) 250 MG tablet 2 tab po x 1 day then 1 tab po daily 6 tablet 0   beclomethasone (QVAR REDIHALER) 80 MCG/ACT inhaler USE ONE PUFF TWICE A DAY TO PREVENT COUGH OR WHEEZING. RINSE, GARGLE, AND SPIT AFTER USE 10.6 g 5   Blood Glucose Monitoring Suppl (ACCU-CHEK GUIDE ME) w/Device KIT daily.     carvedilol (COREG) 6.25 MG tablet Take 6.25 mg by mouth 2 (two) times daily with a meal.     cetirizine (ZYRTEC) 10 MG tablet Take 1 tablet (10 mg total) by mouth daily. 14 tablet 0   Cholecalciferol (VITAMIN D3 PO) Take 1 tablet by mouth daily.     cyclobenzaprine (  FLEXERIL) 10 MG tablet Take 1 tablet (10 mg total) by mouth 3 (three) times daily as needed for muscle spasms. 20 tablet 0   dapagliflozin propanediol (FARXIGA) 10 MG TABS tablet Take 1 tablet (10 mg total) by mouth daily. GFR 90 tablet 3   famotidine (PEPCID) 20 MG tablet Take 20 mg by mouth at bedtime.     fexofenadine (ALLEGRA) 180 MG tablet Take 180 mg by mouth daily.      FLUoxetine (PROZAC) 20 MG capsule TAKE 1 CAPSULE BY MOUTH EVERY DAY 90 capsule 1   furosemide (LASIX) 20 MG tablet Take 1 tablet (20 mg total) by mouth daily as needed. 90 tablet 1   glipiZIDE (GLUCOTROL XL) 5 MG 24 hr tablet Take 1 tablet (5 mg total) by mouth daily with breakfast. 30 tablet 11   irbesartan (AVAPRO) 300 MG tablet TAKE 1 TABLET BY MOUTH EVERY DAY  90 tablet 3   predniSONE (DELTASONE) 20 MG tablet 3 tabs by mouth daily x 5 days, then 2 tabs by mouth daily x 5 days then 1 tab by mouth daily x 5 days 30 tablet 0   Probiotic Product (PROBIOTIC PO) Take 1 tablet by mouth daily.     sodium chloride (OCEAN) 0.65 % SOLN nasal spray Place 1 spray into both nostrils as needed for congestion. 15 mL 0   No facility-administered medications prior to visit.     Per HPI unless specifically indicated in ROS section below Review of Systems  Constitutional:  Negative for fatigue and fever.  HENT:  Negative for ear pain.   Eyes:  Negative for pain.  Respiratory:  Positive for cough, shortness of breath and wheezing.   Cardiovascular:  Negative for chest pain, palpitations and leg swelling.  Gastrointestinal:  Negative for abdominal pain.  Genitourinary:  Negative for dysuria.  Musculoskeletal:  Negative for arthralgias.  Neurological:  Negative for syncope, light-headedness and headaches.  Psychiatric/Behavioral:  Negative for dysphoric mood.   Objective:  BP 118/60   Pulse 81   Temp (!) 97.4 F (36.3 C) (Temporal)   Ht 5' 10.75" (1.797 m)   Wt (!) 327 lb 1 oz (148.4 kg)   SpO2 95%   BMI 45.94 kg/m   Wt Readings from Last 3 Encounters:  11/21/20 (!) 327 lb 1 oz (148.4 kg)  11/19/20 (!) 310 lb (140.6 kg)  08/20/20 (!) 311 lb 8 oz (141.3 kg)      Physical Exam Constitutional:      Appearance: He is well-developed. He is obese.  HENT:     Head: Normocephalic.     Right Ear: Hearing normal.     Left Ear: Hearing normal.     Nose: Nose normal.  Neck:     Thyroid: No thyroid mass or thyromegaly.     Vascular: No carotid bruit.     Trachea: Trachea normal.  Cardiovascular:     Rate and Rhythm: Normal rate and regular rhythm.     Pulses: Normal pulses.     Heart sounds: Heart sounds not distant. No murmur heard.   No friction rub. No gallop.     Comments: No peripheral edema Pulmonary:     Effort: Pulmonary effort is normal. No  respiratory distress.     Breath sounds: Decreased air movement present. Decreased breath sounds present. No wheezing or rhonchi.  Skin:    General: Skin is warm and dry.     Findings: No rash.  Psychiatric:        Speech:  Speech normal.        Behavior: Behavior normal.        Thought Content: Thought content normal.      Results for orders placed or performed in visit on 11/15/20  Comprehensive metabolic panel  Result Value Ref Range   Sodium 139 135 - 145 mEq/L   Potassium 4.9 3.5 - 5.1 mEq/L   Chloride 101 96 - 112 mEq/L   CO2 29 19 - 32 mEq/L   Glucose, Bld 138 (H) 70 - 99 mg/dL   BUN 39 (H) 6 - 23 mg/dL   Creatinine, Ser 2.33 (H) 0.40 - 1.50 mg/dL   Total Bilirubin 0.4 0.2 - 1.2 mg/dL   Alkaline Phosphatase 127 (H) 39 - 117 U/L   AST 14 0 - 37 U/L   ALT 13 0 - 53 U/L   Total Protein 6.6 6.0 - 8.3 g/dL   Albumin 4.0 3.5 - 5.2 g/dL   GFR 29.78 (L) >60.00 mL/min   Calcium 9.4 8.4 - 10.5 mg/dL  Lipid panel  Result Value Ref Range   Cholesterol 154 0 - 200 mg/dL   Triglycerides 124.0 0.0 - 149.0 mg/dL   HDL 42.10 >39.00 mg/dL   VLDL 24.8 0.0 - 40.0 mg/dL   LDL Cholesterol 87 0 - 99 mg/dL   Total CHOL/HDL Ratio 4    NonHDL 111.93   Hemoglobin A1c  Result Value Ref Range   Hgb A1c MFr Bld 7.3 (H) 4.6 - 6.5 %    This visit occurred during the SARS-CoV-2 public health emergency.  Safety protocols were in place, including screening questions prior to the visit, additional usage of staff PPE, and extensive cleaning of exam room while observing appropriate contact time as indicated for disinfecting solutions.   COVID 19 screen:  No recent travel or known exposure to COVID19 The patient denies respiratory symptoms of COVID 19 at this time. The importance of social distancing was discussed today.   Assessment and Plan    Problem List Items Addressed This Visit     DM type 2 (diabetes mellitus, type 2) ([place])    Expect increase with prednisone use. If fasting blood  sugars are remaining > 120 consistently.. can increase glucotrol XL to 10 mg daily.May be able to decrease back down after asthma better as was improving, CBGs < 120 prior to current illness.      Hyperlipidemia associated with type 2 diabetes mellitus ([place])    Not quite at goal  < 70.  Increase atorvastatin to 40 mg daily      Hypertension associated with diabetes (HCC)    Stable, chronic.  Continue current medication.         Moderate persistent asthma with exacerbation - Primary    Acute, improving but only 1-15% on day 2 of antibiotics and prednisone taper. Unable to given in office nebs.  Given DepoMedrol 80 mg IMx 1. Follow CBGs closely.     Complete course on antibiotics  Meds ordered this encounter  Medications   methylPREDNISolone acetate (DEPO-MEDROL) injection 80 mg      Eliezer Lofts, MD

## 2021-01-08 ENCOUNTER — Ambulatory Visit (INDEPENDENT_AMBULATORY_CARE_PROVIDER_SITE_OTHER): Payer: BC Managed Care – PPO | Admitting: Pulmonary Disease

## 2021-01-08 ENCOUNTER — Encounter: Payer: Self-pay | Admitting: Pulmonary Disease

## 2021-01-08 ENCOUNTER — Other Ambulatory Visit: Payer: Self-pay

## 2021-01-08 VITALS — BP 128/76 | HR 85 | Temp 97.5°F | Ht 71.0 in | Wt 327.8 lb

## 2021-01-08 DIAGNOSIS — G4733 Obstructive sleep apnea (adult) (pediatric): Secondary | ICD-10-CM | POA: Diagnosis not present

## 2021-01-08 NOTE — Progress Notes (Signed)
Acute              Richard Giles    [id number]    1960/03/02  Primary Care Physician:Bedsole, Mervyn Gay, MD  Referring Physician: Jinny Sanders, MD [address] [place],  [place] [address]  Chief complaint:   Patient being seen for concern for obstructive sleep apnea  HPI:  History of snoring, daytime sleepiness  He recollects having a sleep study done about 10 years ago showing some sleep apnea, was not started on treatment at the time  Daytime sleepiness Dryness of his mouth in the mornings No headaches Sometimes has night sweats  Usually goes to bed between 1030 and 1130 Takes him about 50 minutes to fall asleep 4-5 awakenings Final wake up time about 5:45 AM  He has a history of hypertension, diabetes, hypercholesterolemia  Remembers that his dad did snore  Outpatient Encounter Medications as of 01/08/2021  Medication Sig   ACCU-CHEK GUIDE test strip TEST BLOOD SUGAR EVERY DAY   Accu-Chek Softclix Lancets lancets TEST BLOOD SUGAR ONCE DAILY   acetaminophen (TYLENOL) 500 MG tablet Take 1,000 mg by mouth every 6 (six) hours as needed (pain).   albuterol (VENTOLIN HFA) 108 (90 Base) MCG/ACT inhaler Inhale 2 puffs into the lungs every 4 (four) hours as needed for wheezing or shortness of breath.   allopurinol (ZYLOPRIM) 300 MG tablet TAKE 1 TABLET BY MOUTH EVERY DAY   amLODipine (NORVASC) 10 MG tablet TAKE 1 TABLET BY MOUTH ONCE A DAY   aspirin 81 MG tablet Take 81 mg by mouth daily.   atorvastatin (LIPITOR) 20 MG tablet TAKE 1 TABLET BY MOUTH EVERY DAY   beclomethasone (QVAR REDIHALER) 80 MCG/ACT inhaler USE ONE PUFF TWICE A DAY TO PREVENT COUGH OR WHEEZING. RINSE, GARGLE, AND SPIT AFTER USE   Blood Glucose Monitoring Suppl (ACCU-CHEK GUIDE ME) w/Device KIT daily.   carvedilol (COREG) 6.25 MG tablet Take 6.25 mg by mouth 2 (two) times daily with a meal.   cetirizine (ZYRTEC) 10 MG tablet Take 1 tablet (10 mg total) by mouth daily.   Cholecalciferol (VITAMIN D3 PO)  Take 1 tablet by mouth daily.   cyclobenzaprine (FLEXERIL) 10 MG tablet Take 1 tablet (10 mg total) by mouth 3 (three) times daily as needed for muscle spasms.   dapagliflozin propanediol (FARXIGA) 10 MG TABS tablet Take 1 tablet (10 mg total) by mouth daily. GFR   famotidine (PEPCID) 20 MG tablet Take 20 mg by mouth at bedtime.   fexofenadine (ALLEGRA) 180 MG tablet Take 180 mg by mouth daily.    FLUoxetine (PROZAC) 20 MG capsule TAKE 1 CAPSULE BY MOUTH EVERY DAY   furosemide (LASIX) 20 MG tablet Take 1 tablet (20 mg total) by mouth daily as needed.   glipiZIDE (GLUCOTROL XL) 5 MG 24 hr tablet Take 1 tablet (5 mg total) by mouth daily with breakfast.   irbesartan (AVAPRO) 300 MG tablet TAKE 1 TABLET BY MOUTH EVERY DAY   Probiotic Product (PROBIOTIC PO) Take 1 tablet by mouth daily.   sodium chloride (OCEAN) 0.65 % SOLN nasal spray Place 1 spray into both nostrils as needed for congestion.   [DISCONTINUED] azithromycin (ZITHROMAX) 250 MG tablet 2 tab po x 1 day then 1 tab po daily (Patient not taking: Reported on 01/08/2021)   [DISCONTINUED] predniSONE (DELTASONE) 20 MG tablet 3 tabs by mouth daily x 5 days, then 2 tabs by mouth daily x 5 days then 1 tab by mouth daily x 5 days (  Patient not taking: Reported on 01/08/2021)   No facility-administered encounter medications on file as of 01/08/2021.    Allergies as of 01/08/2021 - Review Complete 01/08/2021  Allergen Reaction Noted   Bee venom Anaphylaxis 08/31/2011   Hornet venom Anaphylaxis 09/13/2014   Lisinopril Cough 07/16/2014   Codeine Diarrhea, Nausea And Vomiting, and Rash 07/25/2007    Past Medical History:  Diagnosis Date   Allergy    allergy workup with Dr. Caprice Red : Maybe trees Did not receive shots per patient   Chronic bronchitis ([place])    Chronic cough onset 2005/ resolved on ICS July 19,2011   Sinus CT negative 07/08/2009 negative   Chronic kidney disease    per pt 01-04-2015--autoimmune disease per pt   COPD  (chronic obstructive pulmonary disease) ([place])    per pt 01-04-15   Diabetes mellitus without complication (HCC)    Dysplastic nevus of trunk 02/04/2018   GERD (gastroesophageal reflux disease)    Hyperlipidemia    Hypertension    Restless leg syndrome    Sleep apnea    does not have to use cpap    Past Surgical History:  Procedure Laterality Date   bialteral inguinal hernia  1967   COLONOSCOPY  01/14/2015   FOOT SURGERY  09/03/2003   Left foot osteophye removal (Dr. Marlou Sa)   POLYPECTOMY     SEPTOPLASTY  1983   at 60 years of age    Family History  Problem Relation Age of Onset   Hypertension Father    Colon polyps Father    Diabetes Maternal Grandmother    Parkinson's disease Mother    Depression Neg Hx    Drug abuse Neg Hx    Stroke Neg Hx    Colon cancer Neg Hx    Stomach cancer Neg Hx    Esophageal cancer Neg Hx    Rectal cancer Neg Hx     Social History   Socioeconomic History   Marital status: Married    Spouse name: Not on file   Number of children: Not on file   Years of education: Not on file   Highest education level: Not on file  Occupational History   Occupation: software development    Comment: retired  Tobacco Use   Smoking status: Never   Smokeless tobacco: Former    Quit date: 02/23/1993  Vaping Use   Vaping Use: Never used  Substance and Sexual Activity   Alcohol use: No   Drug use: No   Sexual activity: Yes  Other Topics Concern   Not on file  Social History Narrative   Not on file   Social Determinants of Health   Financial Resource Strain: Not on file  Food Insecurity: Not on file  Transportation Needs: Not on file  Physical Activity: Not on file  Stress: Not on file  Social Connections: Not on file  Intimate Partner Violence: Not on file    Review of Systems  Respiratory:  Positive for apnea.   Psychiatric/Behavioral:  Positive for sleep disturbance.    Vitals:   01/08/21 1615  BP: 128/76  Pulse: 85  Temp: (!) 97.5 F  (36.4 C)  SpO2: 97%     Physical Exam Constitutional:      Appearance: He is obese.  HENT:     Head: Normocephalic and atraumatic.     Nose: No congestion.     Mouth/Throat:     Mouth: Mucous membranes are moist.     Comments: Mallampati 4, crowded  oropharynx Cardiovascular:     Rate and Rhythm: Normal rate and regular rhythm.     Heart sounds: No murmur heard.   No friction rub.  Pulmonary:     Effort: No respiratory distress.     Breath sounds: No stridor. No wheezing or rhonchi.  Musculoskeletal:     Cervical back: No rigidity or tenderness.  Neurological:     Mental Status: He is alert.  Psychiatric:        Mood and Affect: Mood normal.   Results of the Epworth flowsheet 01/08/2021  Sitting and reading 3  Watching TV 2  Sitting, inactive in a public place (e.g. a theatre or a meeting) 2  As a passenger in a car for an hour without a break 3  Lying down to rest in the afternoon when circumstances permit 3  Sitting and talking to someone 2  Sitting quietly after a lunch without alcohol 2  In a car, while stopped for a few minutes in traffic 1  Total score 18   Data Reviewed: Previous study not available  Assessment:  Moderate to high probability of significant obstructive sleep apnea  Class III obesity  Excessive daytime sleepiness  Pathophysiology of sleep disordered breathing discussed with the patient Treatment options discussed with the patient  Plan/Recommendations: Schedule patient for a home sleep study  We will update him with results as soon as reviewed  Encouraged weight loss efforts  Encouraged to call us with any significant concerns   Sherrilyn Rist MD [facility] 01/08/2021, 4:35 PM  CC: Jinny Sanders, MD

## 2021-01-08 NOTE — Patient Instructions (Signed)
We will schedule you for home sleep study Update your results as soon as reviewed  CPAP treatment as we discussed  Encourage weight loss efforts  Call us with significant concerns  Tentative follow-up 4 to 5 months  Sleep Apnea Sleep apnea is a condition in which breathing pauses or becomes shallow during sleep. People with sleep apnea usually snore loudly. They may have times when they gasp and stop breathing for 10 seconds or more during sleep. This may happen many times during the night. Sleep apnea disrupts your sleep and keeps your body from getting the rest that it needs. This condition can increase your risk of certain health problems, including: Heart attack. Stroke. Obesity. Type 2 diabetes. Heart failure. Irregular heartbeat. High blood pressure. The goal of treatment is to help you breathe normally again. What are the causes? The most common cause of sleep apnea is a collapsed or blocked airway. There are three kinds of sleep apnea: Obstructive sleep apnea. This kind is caused by a blocked or collapsed airway. Central sleep apnea. This kind happens when the part of the brain that controls breathing does not send the correct signals to the muscles that control breathing. Mixed sleep apnea. This is a combination of obstructive and central sleep apnea. What increases the risk? You are more likely to develop this condition if you: Are overweight. Smoke. Have a smaller than normal airway. Are older. Are male. Drink alcohol. Take sedatives or tranquilizers. Have a family history of sleep apnea. Have a tongue or tonsils that are larger than normal. What are the signs or symptoms? Symptoms of this condition include: Trouble staying asleep. Loud snoring. Morning headaches. Waking up gasping. Dry mouth or sore throat in the morning. Daytime sleepiness and tiredness. If you have daytime fatigue because of sleep apnea, you may be more likely to have: Trouble  concentrating. Forgetfulness. Irritability or mood swings. Personality changes. Feelings of depression. Sexual dysfunction. This may include loss of interest if you are male, or erectile dysfunction if you are male. How is this diagnosed? This condition may be diagnosed with: A medical history. A physical exam. A series of tests that are done while you are sleeping (sleep study). These tests are usually done in a sleep lab, but they may also be done at home. How is this treated? Treatment for this condition aims to restore normal breathing and to ease symptoms during sleep. It may involve managing health issues that can affect breathing, such as high blood pressure or obesity. Treatment may include: Sleeping on your side. Using a decongestant if you have nasal congestion. Avoiding the use of depressants, including alcohol, sedatives, and narcotics. Losing weight if you are overweight. Making changes to your diet. Quitting smoking. Using a device to open your airway while you sleep, such as: An oral appliance. This is a custom-made mouthpiece that shifts your lower jaw forward. A continuous positive airway pressure (CPAP) device. This device blows air through a mask when you breathe out (exhale). A nasal expiratory positive airway pressure (EPAP) device. This device has valves that you put into each nostril. A bi-level positive airway pressure (BIPAP) device. This device blows air through a mask when you breathe in (inhale) and breathe out (exhale). Having surgery if other treatments do not work. During surgery, excess tissue is removed to create a wider airway. Follow these instructions at home: Lifestyle Make any lifestyle changes that your health care provider recommends. Eat a healthy, well-balanced diet. Take steps to lose weight if  you are overweight. Avoid using depressants, including alcohol, sedatives, and narcotics. Do not use any products that contain nicotine or tobacco.  These products include cigarettes, chewing tobacco, and vaping devices, such as e-cigarettes. If you need help quitting, ask your health care provider. General instructions Take over-the-counter and prescription medicines only as told by your health care provider. If you were given a device to open your airway while you sleep, use it only as told by your health care provider. If you are having surgery, make sure to tell your health care provider you have sleep apnea. You may need to bring your device with you. Keep all follow-up visits. This is important. Contact a health care provider if: The device that you received to open your airway during sleep is uncomfortable or does not seem to be working. Your symptoms do not improve. Your symptoms get worse. Get help right away if: You develop: Chest pain. Shortness of breath. Discomfort in your back, arms, or stomach. You have: Trouble speaking. Weakness on one side of your body. Drooping in your face. These symptoms may represent a serious problem that is an emergency. Do not wait to see if the symptoms will go away. Get medical help right away. Call your local emergency services (911 in the [place] Do not drive yourself to the hospital. Summary Sleep apnea is a condition in which breathing pauses or becomes shallow during sleep. The most common cause is a collapsed or blocked airway. The goal of treatment is to restore normal breathing and to ease symptoms during sleep. This information is not intended to replace advice given to you by your health care provider. Make sure you discuss any questions you have with your health care provider. Document Revised: 09/18/2020 Document Reviewed: 01/19/2020 Elsevier Patient Education  2022 [employer].

## 2021-01-12 ENCOUNTER — Other Ambulatory Visit: Payer: Self-pay | Admitting: Family Medicine

## 2021-01-12 ENCOUNTER — Encounter: Payer: Self-pay | Admitting: Family Medicine

## 2021-01-12 DIAGNOSIS — I10 Essential (primary) hypertension: Secondary | ICD-10-CM

## 2021-01-13 MED ORDER — GLIPIZIDE ER 10 MG PO TB24: 10.0000 mg | ORAL_TABLET | Freq: Every day | ORAL | 1 refills | Status: DC

## 2021-01-17 ENCOUNTER — Other Ambulatory Visit: Payer: Self-pay | Admitting: Family Medicine

## 2021-02-05 ENCOUNTER — Telehealth: Payer: Self-pay | Admitting: *Deleted

## 2021-02-05 ENCOUNTER — Encounter: Payer: Self-pay | Admitting: *Deleted

## 2021-02-05 MED ORDER — ATORVASTATIN CALCIUM 40 MG PO TABS
40.0000 mg | ORAL_TABLET | Freq: Every day | ORAL | 3 refills | Status: DC
Start: 2021-02-05 — End: 2022-01-25

## 2021-02-05 NOTE — Telephone Encounter (Signed)
Received [facility] from [facility] requesting PA for Farxiga 10 mg.  PA completed on CoverMyMeds and sent to [facility] for review.  Can take up to 72 hours for a decision.

## 2021-02-10 NOTE — Telephone Encounter (Signed)
PA approved through 02/05/2022. [facility] notified of approval via fax.

## 2021-02-11 ENCOUNTER — Other Ambulatory Visit: Payer: Self-pay | Admitting: Family Medicine

## 2021-02-11 NOTE — Telephone Encounter (Signed)
QVAR not covered by patient's insurance.  Requesting change to Pulmicort that is on formulary.

## 2021-02-13 ENCOUNTER — Other Ambulatory Visit: Payer: Self-pay | Admitting: Family Medicine

## 2021-02-13 ENCOUNTER — Other Ambulatory Visit: Payer: Self-pay

## 2021-02-13 ENCOUNTER — Ambulatory Visit (INDEPENDENT_AMBULATORY_CARE_PROVIDER_SITE_OTHER): Payer: 59 | Admitting: Family Medicine

## 2021-02-13 VITALS — BP 122/66 | HR 86 | Temp 97.8°F | Ht 70.75 in | Wt 327.0 lb

## 2021-02-13 DIAGNOSIS — J4541 Moderate persistent asthma with (acute) exacerbation: Secondary | ICD-10-CM

## 2021-02-13 DIAGNOSIS — E119 Type 2 diabetes mellitus without complications: Secondary | ICD-10-CM

## 2021-02-13 MED ORDER — PROMETHAZINE-DM 6.25-15 MG/5ML PO SYRP: 5.0000 mL | ORAL_SOLUTION | Freq: Every evening | ORAL | 0 refills | Status: DC | PRN

## 2021-02-13 MED ORDER — PREDNISONE 20 MG PO TABS: ORAL_TABLET | ORAL | 0 refills | Status: DC

## 2021-02-13 MED ORDER — OZEMPIC (0.25 OR 0.5 MG/DOSE) 2 MG/1.5ML ~~LOC~~ SOPN: 0.5000 mg | PEN_INJECTOR | SUBCUTANEOUS | 11 refills | Status: DC

## 2021-02-13 MED ORDER — DOXYCYCLINE HYCLATE 100 MG PO TABS: 100.0000 mg | ORAL_TABLET | Freq: Two times a day (BID) | ORAL | 0 refills | Status: DC

## 2021-02-13 NOTE — Patient Instructions (Addendum)
Complete antibiotics, prednisone taper.  Go to ER if severe shortness of breath.   Start ozempic if covered.   Work on low [employer] and regualr exercise as discussed.

## 2021-02-13 NOTE — Progress Notes (Signed)
Patient ID: Richard Giles, male    DOB: 04-29-1960, 60 y.o.   MRN: [id number]  This visit was conducted in person.  There were no vitals taken for this visit.   CC: Chief Complaint  Patient presents with   Weight Management   Cough   Nasal Congestion   Sinus Drainage    Subjective:   HPI: Richard Giles is a 60 y.o. male presenting on 02/13/2021 for Weight Management, Cough, Nasal Congestion, and Sinus Drainage  Morbid obesity with comorbidity including diabetes , HTN and sleep apnea. Body mass index is 45.93 kg/m. Wt Readings from Last 3 Encounters:  02/13/21 (!) 327 lb (148.3 kg)  01/08/21 (!) 327 lb 12.8 oz (148.7 kg)  11/21/20 (!) 327 lb 1 oz (148.4 kg)    He did try Trulicity for glucose control in the past .. could not afford?  He now has new Insurance with [employer].   Diabetes managed with glipizide and dapagliflozin. Lab Results  Component Value Date   HGBA1C 7.3 (H) 11/15/2020    Hx of asthma.. He has had 1 week of runny nose, congestion.. progressed to post nasal drip, ST, cough, productive and dry.  Trouble sleeping due to cough.  No fever, has felt hot subjectively. Ribs sore with cough.  No ear pain, no face pain. He is SOB and chest tightness.  Using Qvar regularly, Using albuterol  as needed. He is using OTC delsym.  No known exposures to COVID some cases of flu.    Minimal exercise: poor food choices, snack.  Relevant past medical, surgical, family and social history reviewed and updated as indicated. Interim medical history since our last visit reviewed. Allergies and medications reviewed and updated. Outpatient Medications Prior to Visit  Medication Sig Dispense Refill   ACCU-CHEK GUIDE test strip TEST BLOOD SUGAR EVERY DAY 100 strip 3   Accu-Chek Softclix Lancets lancets TEST BLOOD SUGAR ONCE DAILY     acetaminophen (TYLENOL) 500 MG tablet Take 1,000 mg by mouth every 6 (six) hours as needed (pain).     albuterol (VENTOLIN HFA) 108  (90 Base) MCG/ACT inhaler Inhale 2 puffs into the lungs every 4 (four) hours as needed for wheezing or shortness of breath. 1 each 3   allopurinol (ZYLOPRIM) 300 MG tablet TAKE 1 TABLET BY MOUTH EVERY DAY 90 tablet 3   amLODipine (NORVASC) 10 MG tablet TAKE 1 TABLET BY MOUTH EVERY DAY 90 tablet 1   aspirin 81 MG tablet Take 81 mg by mouth daily.     atorvastatin (LIPITOR) 40 MG tablet Take 1 tablet (40 mg total) by mouth daily. 90 tablet 3   Blood Glucose Monitoring Suppl (ACCU-CHEK GUIDE ME) w/Device KIT daily.     budesonide (PULMICORT FLEXHALER) 180 MCG/ACT inhaler 1 puff twice daily as needed for wheeze 1 each 11   carvedilol (COREG) 6.25 MG tablet Take 6.25 mg by mouth 2 (two) times daily with a meal.     cetirizine (ZYRTEC) 10 MG tablet Take 1 tablet (10 mg total) by mouth daily. 14 tablet 0   Cholecalciferol (VITAMIN D3 PO) Take 1 tablet by mouth daily.     cyclobenzaprine (FLEXERIL) 10 MG tablet Take 1 tablet (10 mg total) by mouth 3 (three) times daily as needed for muscle spasms. 20 tablet 0   dapagliflozin propanediol (FARXIGA) 10 MG TABS tablet Take 1 tablet (10 mg total) by mouth daily. GFR 90 tablet 3   famotidine (PEPCID) 20 MG tablet Take 20 mg  by mouth at bedtime.     fexofenadine (ALLEGRA) 180 MG tablet Take 180 mg by mouth daily.      FLUoxetine (PROZAC) 20 MG capsule TAKE 1 CAPSULE BY MOUTH EVERY DAY 90 capsule 1   furosemide (LASIX) 20 MG tablet TAKE 1 TABLET BY MOUTH EVERY DAY AS NEEDED 90 tablet 1   glipiZIDE (GLUCOTROL XL) 10 MG 24 hr tablet Take 1 tablet (10 mg total) by mouth daily with breakfast. 90 tablet 1   irbesartan (AVAPRO) 300 MG tablet TAKE 1 TABLET BY MOUTH EVERY DAY 90 tablet 3   Probiotic Product (PROBIOTIC PO) Take 1 tablet by mouth daily.     sodium chloride (OCEAN) 0.65 % SOLN nasal spray Place 1 spray into both nostrils as needed for congestion. 15 mL 0   No facility-administered medications prior to visit.     Per HPI unless specifically indicated in  ROS section below Review of Systems  Constitutional:  Negative for fatigue and fever.  HENT:  Positive for congestion and sinus pressure. Negative for ear pain.   Eyes:  Negative for pain.  Respiratory:  Positive for cough and shortness of breath.   Cardiovascular:  Negative for chest pain, palpitations and leg swelling.  Gastrointestinal:  Negative for abdominal pain.  Genitourinary:  Negative for dysuria.  Musculoskeletal:  Negative for arthralgias.  Neurological:  Negative for syncope, light-headedness and headaches.  Psychiatric/Behavioral:  Negative for dysphoric mood.   Objective:  There were no vitals taken for this visit.  Wt Readings from Last 3 Encounters:  01/08/21 (!) 327 lb 12.8 oz (148.7 kg)  11/21/20 (!) 327 lb 1 oz (148.4 kg)  11/19/20 (!) 310 lb (140.6 kg)      Physical Exam Constitutional:      Appearance: He is well-developed. He is obese.  HENT:     Head: Normocephalic.     Right Ear: Hearing normal.     Left Ear: Hearing normal.     Nose: Nose normal.  Neck:     Thyroid: No thyroid mass or thyromegaly.     Vascular: No carotid bruit.     Trachea: Trachea normal.  Cardiovascular:     Rate and Rhythm: Normal rate and regular rhythm.     Pulses: Normal pulses.     Heart sounds: Heart sounds not distant. No murmur heard.   No friction rub. No gallop.     Comments: No peripheral edema Pulmonary:     Effort: Pulmonary effort is normal. No respiratory distress.     Breath sounds: Wheezing present.  Skin:    General: Skin is warm and dry.     Findings: No rash.  Psychiatric:        Speech: Speech normal.        Behavior: Behavior normal.        Thought Content: Thought content normal.      Results for orders placed or performed in visit on 11/15/20  Comprehensive metabolic panel  Result Value Ref Range   Sodium 139 135 - 145 mEq/L   Potassium 4.9 3.5 - 5.1 mEq/L   Chloride 101 96 - 112 mEq/L   CO2 29 19 - 32 mEq/L   Glucose, Bld 138 (H) 70 - 99  mg/dL   BUN 39 (H) 6 - 23 mg/dL   Creatinine, Ser 2.33 (H) 0.40 - 1.50 mg/dL   Total Bilirubin 0.4 0.2 - 1.2 mg/dL   Alkaline Phosphatase 127 (H) 39 - 117 U/L   AST 14 0 -  37 U/L   ALT 13 0 - 53 U/L   Total Protein 6.6 6.0 - 8.3 g/dL   Albumin 4.0 3.5 - 5.2 g/dL   GFR 29.78 (L) >60.00 mL/min   Calcium 9.4 8.4 - 10.5 mg/dL  Lipid panel  Result Value Ref Range   Cholesterol 154 0 - 200 mg/dL   Triglycerides 124.0 0.0 - 149.0 mg/dL   HDL 42.10 >39.00 mg/dL   VLDL 24.8 0.0 - 40.0 mg/dL   LDL Cholesterol 87 0 - 99 mg/dL   Total CHOL/HDL Ratio 4    NonHDL 111.93   Hemoglobin A1c  Result Value Ref Range   Hgb A1c MFr Bld 7.3 (H) 4.6 - 6.5 %    This visit occurred during the SARS-CoV-2 public health emergency.  Safety protocols were in place, including screening questions prior to the visit, additional usage of staff PPE, and extensive cleaning of exam room while observing appropriate contact time as indicated for disinfecting solutions.   COVID 19 screen:  No recent travel or known exposure to COVID19 The patient denies respiratory symptoms of COVID 19 at this time. The importance of social distancing was discussed today.   Assessment and Plan Problem List Items Addressed This Visit     DM type 2 (diabetes mellitus, type 2) ([place]) - Primary    Chronic, inadequate control.   Will add ozempic to regimen.. start low dose and plan titrate up at 4 weeks follow up.  Continue dapagliflozin with goal of stopping over time. Encouraged exercise, weight loss, healthy eating habits.       Moderate persistent asthma with exacerbation    Complete antibiotics, prednisone taper.  Continue Qvar regularly and albuterol  as needed.  Go to ER if severe shortness of breath.       Morbid obesity ([place])    The patient is advised to begin progressive daily aerobic exercise program, follow a low fat, low cholesterol diet, attempt to lose weight and improve dietary compliance.           Eliezer Lofts, MD

## 2021-02-14 ENCOUNTER — Telehealth: Payer: Self-pay | Admitting: *Deleted

## 2021-02-14 MED ORDER — OZEMPIC (0.25 OR 0.5 MG/DOSE) 2 MG/1.5ML ~~LOC~~ SOPN: 0.2500 mg | PEN_INJECTOR | SUBCUTANEOUS | 5 refills | Status: DC

## 2021-02-14 NOTE — Telephone Encounter (Signed)
Received [facility] for [facility] requesting PA for Ozempic.  PA completed on CoverMyMeds and sent to review.  Can take up to 72 hours for a decision.

## 2021-02-18 NOTE — Telephone Encounter (Signed)
PA  is approved through 02/14/2022.  [facility] notified of approval via fax.

## 2021-02-25 ENCOUNTER — Ambulatory Visit: Payer: BC Managed Care – PPO | Admitting: Family Medicine

## 2021-03-13 ENCOUNTER — Other Ambulatory Visit: Payer: Self-pay

## 2021-03-13 ENCOUNTER — Encounter: Payer: Self-pay | Admitting: Family Medicine

## 2021-03-13 ENCOUNTER — Ambulatory Visit (INDEPENDENT_AMBULATORY_CARE_PROVIDER_SITE_OTHER): Payer: 59 | Admitting: Family Medicine

## 2021-03-13 VITALS — BP 118/74 | HR 82 | Temp 98.3°F | Ht 70.75 in | Wt 308.1 lb

## 2021-03-13 DIAGNOSIS — E119 Type 2 diabetes mellitus without complications: Secondary | ICD-10-CM | POA: Diagnosis not present

## 2021-03-13 LAB — POCT GLYCOSYLATED HEMOGLOBIN (HGB A1C): Hemoglobin A1C: 6.7 % — AB (ref 4.0–5.6)

## 2021-03-13 MED ORDER — OZEMPIC (0.25 OR 0.5 MG/DOSE) 2 MG/1.5ML ~~LOC~~ SOPN: 0.5000 mg | PEN_INJECTOR | SUBCUTANEOUS | 5 refills | Status: DC

## 2021-03-13 NOTE — Patient Instructions (Signed)
Increase the dose of Semaglutide to 0.5 mg weekly.  Continue Farxiga 10 mg daily.  Follow blood sugars.  Call if unresolving side effects.  Keep up the great work on healthy eating and start walking some.

## 2021-03-13 NOTE — Assessment & Plan Note (Signed)
19 lb weight loss since 12/22  5.8% body percentage weight loss.

## 2021-03-13 NOTE — Assessment & Plan Note (Signed)
Improved control! A1C now down from 7.3 to 6.7 with lifestyle changes and 19 lb weight loss .  Continue semaglutide, but increase to 0.5 mg weekly as long as no side effects.  Continue farxiga with goal of  Stopping it over next few office visits. (he has 2 more months supply)

## 2021-03-13 NOTE — Progress Notes (Signed)
Patient ID: Richard Giles, male    DOB: 1961-02-22, 61 y.o.   MRN: [id number]  This visit was conducted in person.  BP 118/74    Pulse 82    Temp 98.3 F (36.8 C) (Temporal)    Ht 5' 10.75" (1.797 m)    Wt (!) 308 lb 2 oz (139.8 kg)    SpO2 96%    BMI 43.28 kg/m    CC:  Chief Complaint  Patient presents with   Diabetes   Weight Management    Subjective:   HPI: Richard Giles is a 61 y.o. male presenting on 03/13/2021 for Diabetes and Weight Management  Diabetes:   Improved control on farxiga 10 mg daily with addition of semaglutide 0.25 mg weekly. Lab Results  Component Value Date   HGBA1C 6.7 (A) 03/13/2021   Minimal SE to GLP1. 19 lb weight loss since last OV! Using medications without difficulties: none Hypoglycemic episodes:none Hyperglycemic episodes: none Feet problems: none Blood Sugars averaging: 120s fasting.. discussed starting occ post prandials eye exam within last year:  Wt Readings from Last 3 Encounters:  03/13/21 (!) 308 lb 2 oz (139.8 kg)  02/13/21 (!) 327 lb (148.3 kg)  01/08/21 (!) 327 lb 12.8 oz (148.7 kg)     Body mass index is 43.28 kg/m.   Relevant past medical, surgical, family and social history reviewed and updated as indicated. Interim medical history since our last visit reviewed. Allergies and medications reviewed and updated. Outpatient Medications Prior to Visit  Medication Sig Dispense Refill   ACCU-CHEK GUIDE test strip TEST BLOOD SUGAR EVERY DAY 100 strip 3   Accu-Chek Softclix Lancets lancets TEST BLOOD SUGAR ONCE DAILY     acetaminophen (TYLENOL) 500 MG tablet Take 1,000 mg by mouth every 6 (six) hours as needed (pain).     albuterol (VENTOLIN HFA) 108 (90 Base) MCG/ACT inhaler Inhale 2 puffs into the lungs every 4 (four) hours as needed for wheezing or shortness of breath. 1 each 3   allopurinol (ZYLOPRIM) 300 MG tablet TAKE 1 TABLET BY MOUTH EVERY DAY 90 tablet 3   amLODipine (NORVASC) 10 MG tablet TAKE 1 TABLET BY MOUTH EVERY  DAY 90 tablet 1   aspirin 81 MG tablet Take 81 mg by mouth daily.     atorvastatin (LIPITOR) 40 MG tablet Take 1 tablet (40 mg total) by mouth daily. 90 tablet 3   Blood Glucose Monitoring Suppl (ACCU-CHEK GUIDE ME) w/Device KIT daily.     budesonide (PULMICORT FLEXHALER) 180 MCG/ACT inhaler 1 puff twice daily as needed for wheeze 1 each 11   carvedilol (COREG) 6.25 MG tablet Take 6.25 mg by mouth 2 (two) times daily with a meal.     cetirizine (ZYRTEC) 10 MG tablet Take 1 tablet (10 mg total) by mouth daily. 14 tablet 0   Cholecalciferol (VITAMIN D3 PO) Take 1 tablet by mouth daily.     cyclobenzaprine (FLEXERIL) 10 MG tablet Take 1 tablet (10 mg total) by mouth 3 (three) times daily as needed for muscle spasms. 20 tablet 0   dapagliflozin propanediol (FARXIGA) 10 MG TABS tablet Take 1 tablet (10 mg total) by mouth daily. GFR 90 tablet 3   doxycycline (VIBRA-TABS) 100 MG tablet Take 1 tablet (100 mg total) by mouth 2 (two) times daily. 20 tablet 0   famotidine (PEPCID) 20 MG tablet Take 20 mg by mouth at bedtime.     fexofenadine (ALLEGRA) 180 MG tablet Take 180 mg by mouth daily.  FLUoxetine (PROZAC) 20 MG capsule TAKE 1 CAPSULE BY MOUTH EVERY DAY 90 capsule 1   furosemide (LASIX) 20 MG tablet TAKE 1 TABLET BY MOUTH EVERY DAY AS NEEDED 90 tablet 1   glipiZIDE (GLUCOTROL XL) 10 MG 24 hr tablet Take 1 tablet (10 mg total) by mouth daily with breakfast. 90 tablet 1   irbesartan (AVAPRO) 300 MG tablet TAKE 1 TABLET BY MOUTH EVERY DAY 90 tablet 3   predniSONE (DELTASONE) 20 MG tablet 3 tabs by mouth daily x 5 days, then 2 tabs by mouth daily x  3 days then 1 tab by mouth daily x 2 days 23 tablet 0   Probiotic Product (PROBIOTIC PO) Take 1 tablet by mouth daily.     promethazine-dextromethorphan (PROMETHAZINE-DM) 6.25-15 MG/5ML syrup Take 5 mLs by mouth at bedtime as needed for cough. 118 mL 0   sodium chloride (OCEAN) 0.65 % SOLN nasal spray Place 1 spray into both nostrils as needed for  congestion. 15 mL 0   Semaglutide,0.25 or 0.5MG/DOS, (OZEMPIC, 0.25 OR 0.5 MG/DOSE,) 2 MG/1.5ML SOPN Inject 0.25 mg into the skin once a week. 1.5 mL 5   No facility-administered medications prior to visit.     Per HPI unless specifically indicated in ROS section below Review of Systems  Constitutional:  Negative for fatigue and fever.  HENT:  Negative for ear pain.   Eyes:  Negative for pain.  Respiratory:  Negative for cough and shortness of breath.   Cardiovascular:  Negative for chest pain, palpitations and leg swelling.  Gastrointestinal:  Negative for abdominal pain.  Genitourinary:  Negative for dysuria.  Musculoskeletal:  Negative for arthralgias.  Neurological:  Negative for syncope, light-headedness and headaches.  Psychiatric/Behavioral:  Negative for dysphoric mood.   Objective:  BP 118/74    Pulse 82    Temp 98.3 F (36.8 C) (Temporal)    Ht 5' 10.75" (1.797 m)    Wt (!) 308 lb 2 oz (139.8 kg)    SpO2 96%    BMI 43.28 kg/m   Wt Readings from Last 3 Encounters:  03/13/21 (!) 308 lb 2 oz (139.8 kg)  02/13/21 (!) 327 lb (148.3 kg)  01/08/21 (!) 327 lb 12.8 oz (148.7 kg)      Physical Exam Constitutional:      Appearance: He is well-developed. He is obese.  HENT:     Head: Normocephalic.     Right Ear: Hearing normal.     Left Ear: Hearing normal.     Nose: Nose normal.  Neck:     Thyroid: No thyroid mass or thyromegaly.     Vascular: No carotid bruit.     Trachea: Trachea normal.  Cardiovascular:     Rate and Rhythm: Normal rate and regular rhythm.     Pulses: Normal pulses.     Heart sounds: Heart sounds not distant. No murmur heard.   No friction rub. No gallop.     Comments: No peripheral edema Pulmonary:     Effort: Pulmonary effort is normal. No respiratory distress.     Breath sounds: Normal breath sounds.  Skin:    General: Skin is warm and dry.     Findings: No rash.  Psychiatric:        Speech: Speech normal.        Behavior: Behavior normal.         Thought Content: Thought content normal.      Results for orders placed or performed in visit on 03/13/21  POCT glycosylated hemoglobin (Hb A1C)  Result Value Ref Range   Hemoglobin A1C 6.7 (A) 4.0 - 5.6 %   HbA1c POC (<> result, manual entry)     HbA1c, POC (prediabetic range)     HbA1c, POC (controlled diabetic range)      This visit occurred during the SARS-CoV-2 public health emergency.  Safety protocols were in place, including screening questions prior to the visit, additional usage of staff PPE, and extensive cleaning of exam room while observing appropriate contact time as indicated for disinfecting solutions.   COVID 19 screen:  No recent travel or known exposure to COVID19 The patient denies respiratory symptoms of COVID 19 at this time. The importance of social distancing was discussed today.   Assessment and Plan Problem List Items Addressed This Visit     DM type 2 (diabetes mellitus, type 2) ([place]) - Primary    Improved control! A1C now down from 7.3 to 6.7 with lifestyle changes and 19 lb weight loss .  Continue semaglutide, but increase to 0.5 mg weekly as long as no side effects.  Continue farxiga with goal of  Stopping it over next few office visits. (he has 2 more months supply)      Relevant Medications   Semaglutide,0.25 or 0.5MG/DOS, (OZEMPIC, 0.25 OR 0.5 MG/DOSE,) 2 MG/1.5ML SOPN   Other Relevant Orders   POCT glycosylated hemoglobin (Hb A1C) (Completed)   Morbid obesity (HCC)    19 lb weight loss since 12/22  5.8% body percentage weight loss.      Relevant Medications   Semaglutide,0.25 or 0.5MG/DOS, (OZEMPIC, 0.25 OR 0.5 MG/DOSE,) 2 MG/1.5ML SOPN       Richard Lofts, MD

## 2021-03-16 NOTE — Progress Notes (Signed)
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 ventolin may helps some but does not eliminate it. prednisone helps some, just finished prednisone last month and much worse since stopped it. minimal actual sputum production. assoc with mild chronic nasal congestion. rec Prednisone x 12 days  Nexium 40 mg Take one 30-60 min before first meal of the day  Pepcid 20 mg at bedtime  Delsym 2 tsp every 12 hours as needed for cough  Stop symbicort   Jul 08, 2009 2 wk followup. Pt states that cough had improved while on prednisone. When he finished prednisone a few days ago cough started to come back. Cough was prod this am with white/yellow sputum. This has occurred despite compliance with diet and ppi. rec add qvar 80 2 bid   July 30, 2009 3 wk followup. Pt states that his cough has resolved.> rec  no change rx  12/12/10 ov/ Richard Giles cc cough resolved completely on qvar and wants it refilled,   No doe >refill qvar , singulair d/c   07/02/2011 Acute OV  Complains of 3 weeks of initial URI that has progressed with increased prod cough with white/yellow mucus, increased SOB, wheezing, tightness in chest. Worse for last 5 days Was called in steroid pack that he finished this am. Prednisone helped only minimally.  Now coughing up thick yellow mucus.  No hemotpysis or chest pain . No edema.  Was under good control with cough until last 3 days.  Using delsym for cough.  Restarted singulair this week.  Restarted Nexium/Pepcid 2 weeks ago.  Has been  using mint cough drops- " forgot about avoiding mints"  rec Zpack take as directed.  Delsym 2 teaspoons twice daily as needed for cough. Avoid Mint products.  Hydromet 1-2 teaspoons every 4-6 hours as needed for cough, may make you sleepy. Use sugarless candy, water, ice chips to help avoid coughing   09/03/2011 f/u ov/Richard Giles cc Patient states better since last visit. States cough is better.  rec Plan A = Qvar 80 Take 2 puffs first thing in am and then another 2 puffs about 12 hours later and blow it out through the nose. Plan B Only use your albuterol as a rescue medication to be used if you can't catch your breath by resting or doing a relaxed purse lip breathing pattern. The less you use it, the better it will work when you need it.  Only use your allegra as a "rescue medication" for your nasal symptoms Try Nexium 40 (prilosec 74m )   Take 30-60 min before first meal of the day and Pepcid 20 mg one bedtime until cough is completely gone for at least a week without the need for cough suppression   09/02/2012 f/u ov/Richard Giles re cough variant asthma/ good control on qvar/ here for yearly f/u/ refills Chief Complaint  Patient presents with   Follow-up    Pt states doing well and denies any co's today.   Refill qvar 80 / f/u  prn   07/11/2014 f/u ov/[name] re: recurrent cough  Chief Complaint  Patient presents with   Acute Visit    Pt c/o "cold" for the past 5 wks- now having increased SOB, acid reflux and cough. Cough is prod with white/blood tinged sputum.    qvar 80 2 puffs each am/  nexium /pepcid whenever cough flared which was very rare until around mid April 2016 while on ACEi felt like caught wife's cold with low grade fever and barking quality cough and rx w/in a week started nex/pepcid and took round of pred plus delsym seen by Dr Deborra Medina 06/25/14 rx zpak then May 16 augmentin > to [facility] 07/10/14 p coughing fit > rib injury on L but neg cxr rx tramadol but not taking aggressively. rec Stop  lisinopril avapro (ibesartan 150 mg) one daily in place of lisinopril 40  stop qvar and start dulera 100 Take 2 puffs first thing in am and then another 2 puffs about 12 hours later.  Take delsym two tsp every 12 hours and supplement if needed with  tramadol 50 mg up to 2 every 4 hours   Once you have eliminated the cough for 3 straight days try reducing the tramadol first,  then the delsym as tolerated.   Finish augmentin  Prednisone 10 mg take  4 each am x 2 days,   2 each am x 2 days,  1 each am x 2 days and stop  Increase nexium to 40 mg Take 30-60 min before first meal of the day      03/17/2021  Re establish ov/[name] re: cough variant asthma   maint on qvar  but having more freq flares  Chief Complaint  Patient presents with   Follow-up    Pt states he had bronchitis in Dec 2022- tx with pred and doxy.   Dyspnea:  working on wt loss, walking neighborhood ok unless hills  = MMRC1 = can walk nl pace, flat grade, can't hurry or go uphills or steps s sob   Cough: none  Sleeping:  [place] w/u in progress SABA ZHY:QMVH now  02: none  Covid status:  vax x 4 no bivalent    No obvious day to day or daytime variability or assoc excess/ purulent sputum or mucus plugs or hemoptysis or cp or chest tightness, subjective wheeze or overt sinus or hb symptoms.   Also denies any obvious fluctuation of symptoms with weather or environmental changes or other aggravating or alleviating factors except as outlined above   No unusual exposure hx or h/o childhood pna/ asthma or knowledge of premature birth.  Current Allergies, Complete Past Medical History, Past Surgical History, Family History, and Social History were reviewed in [employer] record.  ROS  The following are not active complaints unless bolded Hoarseness, sore throat, dysphagia, dental problems, itching, sneezing,  nasal congestion or discharge of excess mucus or purulent secretions, ear ache,   fever, chills,  sweats, unintended wt loss or wt gain, classically pleuritic or exertional cp,  orthopnea pnd or arm/hand swelling  or leg swelling, presyncope, palpitations, abdominal pain, anorexia, nausea, vomiting, diarrhea  or change in bowel habits or change in bladder habits, change in stools or change in urine, dysuria, hematuria,  rash, arthralgias, visual complaints, headache, numbness, weakness or ataxia or problems with walking or coordination,  change in mood or  memory.        Current Meds  Medication Sig   ACCU-CHEK GUIDE test strip TEST BLOOD  SUGAR EVERY DAY   Accu-Chek Softclix Lancets lancets TEST BLOOD SUGAR ONCE DAILY   acetaminophen (TYLENOL) 500 MG tablet Take 1,000 mg by mouth every 6 (six) hours as needed (pain).   albuterol (VENTOLIN HFA) 108 (90 Base) MCG/ACT inhaler Inhale 2 puffs into the lungs every 4 (four) hours as needed for wheezing or shortness of breath.   allopurinol (ZYLOPRIM) 300 MG tablet TAKE 1 TABLET BY MOUTH EVERY DAY   amLODipine (NORVASC) 10 MG tablet TAKE 1 TABLET BY MOUTH EVERY DAY   aspirin 81 MG tablet Take 81 mg by mouth daily.   atorvastatin (LIPITOR) 40 MG tablet Take 1 tablet (40 mg total) by mouth daily.   beclomethasone (QVAR) 80 MCG/ACT inhaler Inhale 2 puffs into the lungs 2 (two) times daily.   Blood Glucose Monitoring Suppl (ACCU-CHEK GUIDE ME) w/Device KIT daily.   budesonide (PULMICORT FLEXHALER) 180 MCG/ACT inhaler 1 puff twice daily as needed for wheeze   carvedilol (COREG) 6.25 MG tablet Take 6.25 mg by mouth 2 (two) times daily with a meal.   cetirizine (ZYRTEC) 10 MG tablet Take 1 tablet (10 mg total) by mouth daily.   Cholecalciferol (VITAMIN D3 PO) Take 1 tablet by mouth daily.   cyclobenzaprine (FLEXERIL) 10 MG tablet Take 1 tablet (10 mg total) by mouth 3 (three) times daily as needed for muscle spasms.   dapagliflozin propanediol (FARXIGA) 10 MG TABS tablet Take 1 tablet (10 mg total) by mouth daily. GFR   famotidine (PEPCID) 20 MG tablet  Take 20 mg by mouth at bedtime.   fexofenadine (ALLEGRA) 180 MG tablet Take 180 mg by mouth daily.    FLUoxetine (PROZAC) 20 MG capsule TAKE 1 CAPSULE BY MOUTH EVERY DAY   furosemide (LASIX) 20 MG tablet TAKE 1 TABLET BY MOUTH EVERY DAY AS NEEDED   glipiZIDE (GLUCOTROL XL) 10 MG 24 hr tablet Take 1 tablet (10 mg total) by mouth daily with breakfast.   irbesartan (AVAPRO) 300 MG tablet TAKE 1 TABLET BY MOUTH EVERY DAY   Probiotic Product (PROBIOTIC PO) Take 1 tablet by mouth daily.   promethazine-dextromethorphan (PROMETHAZINE-DM) 6.25-15 MG/5ML syrup Take 5 mLs by mouth at bedtime as needed for cough.   Semaglutide,0.25 or 0.5MG/DOS, (OZEMPIC, 0.25 OR 0.5 MG/DOSE,) 2 MG/1.5ML SOPN Inject 0.5 mg into the skin once a week.   sodium chloride (OCEAN) 0.65 % SOLN nasal spray Place 1 spray into both nostrils as needed for congestion.               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  - Maybe trees > did not rec shots per pt  Chronic cough  - onset 2005 > resolved on ICS September 10, 2009  - Allergy profile sent Jul 08, 2009 >> neg  - Sinus Ct ordered Jul 08, 2009 >> neg  - Qvar trial Jul 08, 2009 >> much better July 30, 2009 with 90% effective hfa - flared on ACEi April 2016 > resolved at f/u off acei 07/25/14          Objective:   Physical Exam   wts   03/17/2021       306  07/11/2014       289   > 07/25/2014 288 > 07/30/2014 286  Wt Readings from Last 3 Encounters:  09/02/12 298 lb (135.172 kg)  03/14/12 284 lb (128.822 kg)  02/03/12 281 lb (127.461 kg)     Vital signs reviewed  03/17/2021  - Note at rest 02  sats  97% on RA   General appearance:    amb obese wm nad   HEENT : pt wearing mask not removed for exam due to covid -19 concerns.    NECK :  without JVD/Nodes/TM/ nl carotid upstrokes bilaterally   LUNGS: no acc muscle use,  Nl contour chest which is clear to A and P bilaterally without cough on insp or exp maneuvers   CV:  RRR  no s3 or murmur or increase  in P2, and no edema   ABD:  obese soft and nontender with nl inspiratory excursion in the supine position. No bruits or organomegaly appreciated, bowel sounds nl  MS:  Nl gait/ ext warm without deformities, calf tenderness, cyanosis or clubbing No obvious joint restrictions   SKIN: warm and dry without lesions    NEURO:  alert, approp, nl sensorium with  no motor or cerebellar deficits apparent.            Assessment:

## 2021-03-17 ENCOUNTER — Other Ambulatory Visit: Payer: Self-pay | Admitting: Internal Medicine

## 2021-03-17 ENCOUNTER — Ambulatory Visit (INDEPENDENT_AMBULATORY_CARE_PROVIDER_SITE_OTHER): Payer: 59 | Admitting: Internal Medicine

## 2021-03-17 ENCOUNTER — Other Ambulatory Visit: Payer: Self-pay

## 2021-03-17 ENCOUNTER — Encounter: Payer: Self-pay | Admitting: Internal Medicine

## 2021-03-17 DIAGNOSIS — J45991 Cough variant asthma: Secondary | ICD-10-CM | POA: Diagnosis not present

## 2021-03-17 DIAGNOSIS — J4541 Moderate persistent asthma with (acute) exacerbation: Secondary | ICD-10-CM | POA: Diagnosis not present

## 2021-03-17 MED ORDER — DULERA 200-5 MCG/ACT IN AERO
INHALATION_SPRAY | RESPIRATORY_TRACT | 11 refills | Status: DC
Start: 2021-03-17 — End: 2021-03-17

## 2021-03-17 NOTE — Patient Instructions (Signed)
I recommend the bivalent covid vaccine   Plan A = Automatic = Always=    dulera 200 Take 2 puffs first thing in am and then another 2 puffs about 12 hours later if a having any resp symptoms at all.     Plan B = Backup (to supplement plan A, not to replace it) Only use your albuterol inhaler as a rescue medication to be used if you can't catch your breath by resting or doing a relaxed purse lip breathing pattern.  - The less you use it, the better it will work when you need it. - Ok to use the inhaler up to 2 puffs  every 4 hours if you must but call for appointment if use goes up over your usual need - Don't leave home without it !!  (think of it like the spare tire for your car)    Please schedule a follow up visit in 6 months but call sooner if needed

## 2021-03-18 ENCOUNTER — Encounter: Payer: Self-pay | Admitting: Internal Medicine

## 2021-03-18 NOTE — Assessment & Plan Note (Signed)
0nset around 2005 / never smoker - 07/11/2014 p extensive coaching HFA effectiveness =    90% rec try dulera 100 2bid - 07/26/14 cough resolved off acei > retry qvar 80 bid  - 07/31/14 > cough flared so rec dulera 100 2bid  - Sinus CT 08/10/2014 > Minor mucosal thickening in the BILATERAL maxillary sinuses without air-fluid level. - 03/17/2021 changed qvar 80 to dulera 200 2bid due to increase in freq of flares  DDX of  difficult airways management almost all start with A and  include Adherence, Ace Inhibitors, Acid Reflux, Active Sinus Disease, Alpha 1 Antitripsin deficiency, Anxiety masquerading as Airways dz,  ABPA,  Allergy(esp in young), Aspiration (esp in elderly), Adverse effects of meds,  Active smoking or vaping, A bunch of PE's (a small clot burden can't cause this syndrome unless there is already severe underlying pulm or vascular dz with poor reserve) plus two Bs  = Bronchiectasis and Beta blocker use..and one C= CHF   Adherence is always the initial "prime suspect" and is a multilayered concern that requires a "trust but verify" approach in every patient - starting with knowing how to use medications, especially inhalers, correctly, keeping up with refills and understanding the fundamental difference between maintenance and prns vs those medications only taken for a very short course and then stopped and not refilled.  - - The proper method of use, as well as anticipated side effects, of a metered-dose inhaler were discussed and demonstrated to the patient using teach back method.  -he is a bit iffy on med names so  return with all meds in hand using a trust but verify approach to confirm accurate Medication  Reconciliation The principal here is that until we are certain that the  patients are doing what we've asked, it makes no sense to ask them to do more.   ? Allergy > use high dose ICS/ laba per guidelines   ? ACEi > no longer using   ? Acid (or non-acid) GERD > always difficult to  exclude as up to 75% of pts in some series report no assoc GI/ Heartburn symptoms> rec  Wt loss, diet/ pepcid at hs and add ppi qam next step if cont flares  ? BB effects > would change to bisoprolol if higher doses needed   F/u 6 m if doing well, if not return earlier with all meds in hand

## 2021-03-18 NOTE — Assessment & Plan Note (Signed)
Body mass index is 43.1 kg/m.  -  trending up  Lab Results  Component Value Date   TSH 2.17 02/15/2019      Contributing to doe and risk of GERD >>>   reviewed the need and the process to achieve and maintain neg calorie balance > defer f/u primary care including intermittently monitoring thyroid status             Each maintenance medication was reviewed in detail including emphasizing most importantly the difference between maintenance and prns and under what circumstances the prns are to be triggered using an action plan format where appropriate.  Total time for H and P, chart review, counseling, reviewing hfa device(s) and generating customized AVS unique to this office visit / same day charting = 32 min with pt to re establish p > 3 y absence.

## 2021-03-19 ENCOUNTER — Other Ambulatory Visit: Payer: Self-pay | Admitting: Internal Medicine

## 2021-03-19 DIAGNOSIS — J4541 Moderate persistent asthma with (acute) exacerbation: Secondary | ICD-10-CM

## 2021-03-20 ENCOUNTER — Encounter: Payer: Self-pay | Admitting: Internal Medicine

## 2021-03-20 NOTE — Telephone Encounter (Signed)
Try symbicort 160 Take 2 puffs first thing in am and then another 2 puffs about 12 hours later. And if not then advair 115 same rx

## 2021-03-20 NOTE — Telephone Encounter (Signed)
Please advise on patient mychart message  Dr. Melvyn Novas,  My insurance doesn't cover Dulera 200.  Do you have an alternative medicine that you can prescribe for me? I have [employer].  Thanks, Shanon Brow

## 2021-03-21 ENCOUNTER — Other Ambulatory Visit (HOSPITAL_COMMUNITY): Payer: Self-pay

## 2021-03-21 ENCOUNTER — Ambulatory Visit: Payer: 59

## 2021-03-21 ENCOUNTER — Other Ambulatory Visit: Payer: Self-pay

## 2021-03-21 DIAGNOSIS — G4733 Obstructive sleep apnea (adult) (pediatric): Secondary | ICD-10-CM

## 2021-03-21 NOTE — Assessment & Plan Note (Signed)
Chronic, inadequate control.   Will add ozempic to regimen.. start low dose and plan titrate up at 4 weeks follow up.  Continue dapagliflozin with goal of stopping over time. Encouraged exercise, weight loss, healthy eating habits.

## 2021-03-21 NOTE — Assessment & Plan Note (Signed)
Complete antibiotics, prednisone taper.  Continue Qvar regularly and albuterol  as needed.  Go to ER if severe shortness of breath.

## 2021-03-21 NOTE — Telephone Encounter (Signed)
Pharmacy team, can we run a test claim on Symbicort 173mcg and Advair HFA 114mcg for this patient?   Thank you!

## 2021-03-21 NOTE — Assessment & Plan Note (Signed)
The patient is advised to begin progressive daily aerobic exercise program, follow a low fat, low cholesterol diet, attempt to lose weight and improve dietary compliance.

## 2021-03-25 ENCOUNTER — Other Ambulatory Visit (HOSPITAL_COMMUNITY): Payer: Self-pay

## 2021-03-25 NOTE — Telephone Encounter (Signed)
Symbicort is 2 drugs in one and if used correctly and consistently should eliminate need for all other inhalers in the long run and avoid UC or ER trips for asthma so is very cost effective but if can't afford it rec ov with drug formulary in hand

## 2021-03-26 MED ORDER — BUDESONIDE-FORMOTEROL FUMARATE 160-4.5 MCG/ACT IN AERO: 2.0000 | INHALATION_SPRAY | Freq: Two times a day (BID) | RESPIRATORY_TRACT | 6 refills | Status: DC

## 2021-03-27 ENCOUNTER — Telehealth: Payer: Self-pay | Admitting: Pulmonary Disease

## 2021-03-27 DIAGNOSIS — G4733 Obstructive sleep apnea (adult) (pediatric): Secondary | ICD-10-CM

## 2021-03-27 NOTE — Telephone Encounter (Signed)
Call patient  Sleep study result  Date of study: 03/22/2021  Impression: Severe obstructive sleep apnea Moderate oxygen desaturations  Recommendation: DME referral  Recommend CPAP therapy for severe obstructive sleep apnea  Auto titrating CPAP with pressure settings of 5-20 will be appropriate  Encourage weight loss measures  Follow-up in the office 4 to 6 weeks following initiation of treatment

## 2021-03-27 NOTE — Telephone Encounter (Signed)
I called the patient and he is agreeable to the CPAP and I have placed the order. He is aware we will get the approval from insurance and once he has the machine to call back once he has the machine 4-6 weeks to get a download.

## 2021-03-29 ENCOUNTER — Encounter: Payer: Self-pay | Admitting: Pulmonary Disease

## 2021-04-10 ENCOUNTER — Other Ambulatory Visit: Payer: Self-pay

## 2021-04-10 ENCOUNTER — Ambulatory Visit (INDEPENDENT_AMBULATORY_CARE_PROVIDER_SITE_OTHER): Payer: 59 | Admitting: Family Medicine

## 2021-04-10 ENCOUNTER — Encounter: Payer: Self-pay | Admitting: Family Medicine

## 2021-04-10 NOTE — Progress Notes (Signed)
Patient ID: Richard Giles, male    DOB: 08/12/1960, 61 y.o.   MRN: [id number]  This visit was conducted in person.  BP 116/68    Pulse 81    Temp (!) 97.5 F (36.4 C) (Temporal)    Ht _0  (1.778 m)    Wt 296 lb (134.3 kg)    SpO2 94%    BMI 42.47 kg/m    CC:  Chief Complaint  Patient presents with   Weight Management    Here for 4 wk f/u.    Subjective:   HPI: Richard Giles is a 61 y.o. male presenting on 04/10/2021 for Weight Management (Here for 4 wk f/u.)   Weight management : Morbid obesity BMI 42 with comorbidity of DM, HTN and hyperlipidemia  No SE to the medication. He has lost 14 lbs in the last month. He has lost 31 lbs total since start  9% of body weight loss since start Wt Readings from Last 3 Encounters:  04/10/21 296 lb (134.3 kg)  03/17/21 (!) 309 lb (140.2 kg)  03/13/21 (!) 308 lb 2 oz (139.8 kg)    He is avoiding carbs.   He started wearing CPAP.. has helped with energy.  He is walking, catching baseball, walking some as well.   FBS 120-130     Relevant past medical, surgical, family and social history reviewed and updated as indicated. Interim medical history since our last visit reviewed. Allergies and medications reviewed and updated. Outpatient Medications Prior to Visit  Medication Sig Dispense Refill   ACCU-CHEK GUIDE test strip TEST BLOOD SUGAR EVERY DAY 100 strip 3   Accu-Chek Softclix Lancets lancets TEST BLOOD SUGAR ONCE DAILY     acetaminophen (TYLENOL) 500 MG tablet Take 1,000 mg by mouth every 6 (six) hours as needed (pain).     albuterol (VENTOLIN HFA) 108 (90 Base) MCG/ACT inhaler Inhale 2 puffs into the lungs every 4 (four) hours as needed for wheezing or shortness of breath. 1 each 3   allopurinol (ZYLOPRIM) 300 MG tablet TAKE 1 TABLET BY MOUTH EVERY DAY 90 tablet 3   amLODipine (NORVASC) 10 MG tablet TAKE 1 TABLET BY MOUTH EVERY DAY 90 tablet 1   aspirin 81 MG tablet Take 81 mg by mouth daily.     atorvastatin (LIPITOR) 40 MG  tablet Take 1 tablet (40 mg total) by mouth daily. 90 tablet 3   Blood Glucose Monitoring Suppl (ACCU-CHEK GUIDE ME) w/Device KIT daily.     budesonide-formoterol (SYMBICORT) 160-4.5 MCG/ACT inhaler Inhale 2 puffs into the lungs in the morning and at bedtime. 10.2 g 6   carvedilol (COREG) 6.25 MG tablet Take 6.25 mg by mouth 2 (two) times daily with a meal.     cetirizine (ZYRTEC) 10 MG tablet Take 1 tablet (10 mg total) by mouth daily. 14 tablet 0   Cholecalciferol (VITAMIN D3 PO) Take 1 tablet by mouth daily.     cyclobenzaprine (FLEXERIL) 10 MG tablet Take 1 tablet (10 mg total) by mouth 3 (three) times daily as needed for muscle spasms. 20 tablet 0   dapagliflozin propanediol (FARXIGA) 10 MG TABS tablet Take 1 tablet (10 mg total) by mouth daily. GFR 90 tablet 3   famotidine (PEPCID) 20 MG tablet Take 20 mg by mouth at bedtime.     fexofenadine (ALLEGRA) 180 MG tablet Take 180 mg by mouth daily.      FLUoxetine (PROZAC) 20 MG capsule TAKE 1 CAPSULE BY MOUTH EVERY DAY 90  capsule 1   furosemide (LASIX) 20 MG tablet TAKE 1 TABLET BY MOUTH EVERY DAY AS NEEDED 90 tablet 1   glipiZIDE (GLUCOTROL XL) 10 MG 24 hr tablet Take 1 tablet (10 mg total) by mouth daily with breakfast. 90 tablet 1   irbesartan (AVAPRO) 300 MG tablet TAKE 1 TABLET BY MOUTH EVERY DAY 90 tablet 3   Probiotic Product (PROBIOTIC PO) Take 1 tablet by mouth daily.     promethazine-dextromethorphan (PROMETHAZINE-DM) 6.25-15 MG/5ML syrup Take 5 mLs by mouth at bedtime as needed for cough. 118 mL 0   Semaglutide,0.25 or 0.5MG/DOS, (OZEMPIC, 0.25 OR 0.5 MG/DOSE,) 2 MG/1.5ML SOPN Inject 0.5 mg into the skin once a week. 1.5 mL 5   sodium chloride (OCEAN) 0.65 % SOLN nasal spray Place 1 spray into both nostrils as needed for congestion. 15 mL 0   No facility-administered medications prior to visit.     Per HPI unless specifically indicated in ROS section below Review of Systems  Constitutional:  Negative for fatigue and fever.   HENT:  Negative for ear pain.   Eyes:  Negative for pain.  Respiratory:  Negative for cough and shortness of breath.   Cardiovascular:  Negative for chest pain, palpitations and leg swelling.  Gastrointestinal:  Negative for abdominal pain.  Genitourinary:  Negative for dysuria.  Musculoskeletal:  Negative for arthralgias.  Neurological:  Negative for syncope, light-headedness and headaches.  Psychiatric/Behavioral:  Negative for dysphoric mood.   Objective:  BP 116/68    Pulse 81    Temp (!) 97.5 F (36.4 C) (Temporal)    Ht _0  (1.778 m)    Wt 296 lb (134.3 kg)    SpO2 94%    BMI 42.47 kg/m   Wt Readings from Last 3 Encounters:  04/10/21 296 lb (134.3 kg)  03/17/21 (!) 309 lb (140.2 kg)  03/13/21 (!) 308 lb 2 oz (139.8 kg)    BP Readings from Last 3 Encounters:  04/10/21 116/68  03/17/21 118/74  03/13/21 118/74     Physical Exam Constitutional:      Appearance: He is well-developed. He is obese.  HENT:     Head: Normocephalic.     Right Ear: Hearing normal.     Left Ear: Hearing normal.     Nose: Nose normal.  Neck:     Thyroid: No thyroid mass or thyromegaly.     Vascular: No carotid bruit.     Trachea: Trachea normal.  Cardiovascular:     Rate and Rhythm: Normal rate and regular rhythm.     Pulses: Normal pulses.     Heart sounds: Heart sounds not distant. No murmur heard.   No friction rub. No gallop.     Comments: No peripheral edema Pulmonary:     Effort: Pulmonary effort is normal. No respiratory distress.     Breath sounds: Normal breath sounds.  Skin:    General: Skin is warm and dry.     Findings: No rash.  Psychiatric:        Speech: Speech normal.        Behavior: Behavior normal.        Thought Content: Thought content normal.      Results for orders placed or performed in visit on 03/13/21  POCT glycosylated hemoglobin (Hb A1C)  Result Value Ref Range   Hemoglobin A1C 6.7 (A) 4.0 - 5.6 %   HbA1c POC (<> result, manual entry)     HbA1c,  POC (prediabetic range)  HbA1c, POC (controlled diabetic range)      This visit occurred during the SARS-CoV-2 public health emergency.  Safety protocols were in place, including screening questions prior to the visit, additional usage of staff PPE, and extensive cleaning of exam room while observing appropriate contact time as indicated for disinfecting solutions.   COVID 19 screen:  No recent travel or known exposure to COVID19 The patient denies respiratory symptoms of COVID 19 at this time. The importance of social distancing was discussed today.   Assessment and Plan Problem List Items Addressed This Visit     Morbid obesity ([place]) - Primary    Significant continued weight loss with semaglutide at higher dose 0.5 mg weekly.  Discussed continued lifestyle changes and maintenance of weigh loss.          Eliezer Lofts, MD

## 2021-04-12 NOTE — Assessment & Plan Note (Signed)
Significant continued weight loss with semaglutide at higher dose 0.5 mg weekly.  Discussed continued lifestyle changes and maintenance of weigh loss.

## 2021-04-25 ENCOUNTER — Other Ambulatory Visit: Payer: Self-pay | Admitting: Family Medicine

## 2021-04-25 MED ORDER — CARVEDILOL 6.25 MG PO TABS: 6.2500 mg | ORAL_TABLET | Freq: Two times a day (BID) | ORAL | 11 refills | Status: DC

## 2021-05-03 ENCOUNTER — Other Ambulatory Visit: Payer: Self-pay | Admitting: Family Medicine

## 2021-05-04 NOTE — Telephone Encounter (Signed)
Last office visit 04/10/21 for weight management.  Last refilled 05/09/20 for #90 with 3 refills.  AVS on 03/13/21: Continue farxiga with goal of  Stopping it over next few office visits. (he has 2 more months supply.  Next Appt: 05/08/21 for weight management.  Refill? ? ?

## 2021-05-08 ENCOUNTER — Encounter: Payer: Self-pay | Admitting: Family Medicine

## 2021-05-08 ENCOUNTER — Other Ambulatory Visit: Payer: Self-pay

## 2021-05-08 ENCOUNTER — Ambulatory Visit (INDEPENDENT_AMBULATORY_CARE_PROVIDER_SITE_OTHER): Payer: 59 | Admitting: Family Medicine

## 2021-05-08 DIAGNOSIS — E119 Type 2 diabetes mellitus without complications: Secondary | ICD-10-CM

## 2021-05-08 MED ORDER — DAPAGLIFLOZIN PROPANEDIOL 5 MG PO TABS: 5.0000 mg | ORAL_TABLET | Freq: Every day | ORAL | 3 refills | Status: DC

## 2021-05-08 MED ORDER — SEMAGLUTIDE (1 MG/DOSE) 4 MG/3ML ~~LOC~~ SOPN: 1.0000 mg | PEN_INJECTOR | SUBCUTANEOUS | 11 refills | Status: DC

## 2021-05-08 NOTE — Assessment & Plan Note (Signed)
Chronic, improved control with weight loss on semaglutide farxiga and glipizide. ?We are increasing the semaglutide for weight management.  He has started having lower blood sugars recently.  We will decrease his farxiga dose to 5 mg daily as he would prefer to stop this medication given its cost. ?

## 2021-05-08 NOTE — Assessment & Plan Note (Signed)
Chronic, significant improvement on semaglutide 0.5 mg weekly.  He has had 42 pound weight loss overall since start.  We will increase the semaglutide 1 mg weekly.  He will continue working on increased activity and decrease caloric intake.  Follow-up in 4 weeks. ?

## 2021-05-08 NOTE — Patient Instructions (Signed)
Keep working on healthy eating and regular exercise. ? Decrease farxiga to 5 mg daily and increase semaglutide to 1 mg weekly. ? ?

## 2021-05-08 NOTE — Progress Notes (Signed)
? ? Patient ID: Richard Giles, male    DOB: [birth year], 61 y.o.   MRN: [id number] ? ?This visit was conducted in person. ? ?There were no vitals taken for this visit.  ? ?CC:  ?Chief Complaint  ?Patient presents with  ? Weight Check  ?  Weight check    ? ? ?Subjective:  ? ?HPI: ?Richard Giles is a 61 y.o. male presenting on 05/08/2021 for Weight Check (Weight check  ) ? ?1 month follow up morbid obesity. ?He has continued to have weight loss.  He has lost another 11 pounds in the last month on semaglutide 0.5 mg weekly.  His total weight loss to date is 42 pounds. ?He denies side effects to semaglutide.  No abdominal pain nausea or vomiting. ? ? He is working on portion size and healthier choices. ? He is doing more overall, back to being active coaching baseball. ?Wt Readings from Last 3 Encounters:  ?05/08/21 285 lb 6.4 oz (129.5 kg)  ?04/10/21 296 lb (134.3 kg)  ?03/17/21 (!) 309 lb (140.2 kg)  ? ?Body mass index is 40.95 kg/m?. ? ?Blood sugars at home running... FBS 72 ?  ?   ?Lab Results  ?Component Value Date  ? HGBA1C 6.7 (A) 03/13/2021  ? ? ? ? ?Relevant past medical, surgical, family and social history reviewed and updated as indicated. Interim medical history since our last visit reviewed. ?Allergies and medications reviewed and updated. ?Outpatient Medications Prior to Visit  ?Medication Sig Dispense Refill  ? ACCU-CHEK GUIDE test strip TEST BLOOD SUGAR EVERY DAY 100 strip 3  ? Accu-Chek Softclix Lancets lancets TEST BLOOD SUGAR ONCE DAILY    ? acetaminophen (TYLENOL) 500 MG tablet Take 1,000 mg by mouth every 6 (six) hours as needed (pain).    ? albuterol (VENTOLIN HFA) 108 (90 Base) MCG/ACT inhaler Inhale 2 puffs into the lungs every 4 (four) hours as needed for wheezing or shortness of breath. 1 each 3  ? allopurinol (ZYLOPRIM) 300 MG tablet TAKE 1 TABLET BY MOUTH EVERY DAY 90 tablet 3  ? amLODipine (NORVASC) 10 MG tablet TAKE 1 TABLET BY MOUTH EVERY DAY 90 tablet 1  ? aspirin 81 MG tablet Take 81 mg by  mouth daily.    ? atorvastatin (LIPITOR) 40 MG tablet Take 1 tablet (40 mg total) by mouth daily. 90 tablet 3  ? Blood Glucose Monitoring Suppl (ACCU-CHEK GUIDE ME) w/Device KIT daily.    ? budesonide-formoterol (SYMBICORT) 160-4.5 MCG/ACT inhaler Inhale 2 puffs into the lungs in the morning and at bedtime. 10.2 g 6  ? carvedilol (COREG) 6.25 MG tablet Take 1 tablet (6.25 mg total) by mouth 2 (two) times daily with a meal. 60 tablet 11  ? cetirizine (ZYRTEC) 10 MG tablet Take 1 tablet (10 mg total) by mouth daily. 14 tablet 0  ? Cholecalciferol (VITAMIN D3 PO) Take 1 tablet by mouth daily.    ? cyclobenzaprine (FLEXERIL) 10 MG tablet Take 1 tablet (10 mg total) by mouth 3 (three) times daily as needed for muscle spasms. 20 tablet 0  ? famotidine (PEPCID) 20 MG tablet Take 20 mg by mouth at bedtime.    ? FARXIGA 10 MG TABS tablet TAKE 1 TABLET (10 MG TOTAL) BY MOUTH DAILY 90 tablet 3  ? fexofenadine (ALLEGRA) 180 MG tablet Take 180 mg by mouth daily.     ? FLUoxetine (PROZAC) 20 MG capsule TAKE 1 CAPSULE BY MOUTH EVERY DAY 90 capsule 1  ? furosemide (LASIX) 20  MG tablet TAKE 1 TABLET BY MOUTH EVERY DAY AS NEEDED 90 tablet 1  ? glipiZIDE (GLUCOTROL XL) 10 MG 24 hr tablet Take 1 tablet (10 mg total) by mouth daily with breakfast. 90 tablet 1  ? irbesartan (AVAPRO) 300 MG tablet TAKE 1 TABLET BY MOUTH EVERY DAY 90 tablet 3  ? Probiotic Product (PROBIOTIC PO) Take 1 tablet by mouth daily.    ? promethazine-dextromethorphan (PROMETHAZINE-DM) 6.25-15 MG/5ML syrup Take 5 mLs by mouth at bedtime as needed for cough. 118 mL 0  ? Semaglutide,0.25 or 0.5MG/DOS, (OZEMPIC, 0.25 OR 0.5 MG/DOSE,) 2 MG/1.5ML SOPN Inject 0.5 mg into the skin once a week. 1.5 mL 5  ? sodium chloride (OCEAN) 0.65 % SOLN nasal spray Place 1 spray into both nostrils as needed for congestion. 15 mL 0  ? ?No facility-administered medications prior to visit.  ?  ? ?Per HPI unless specifically indicated in ROS section below ?Review of Systems   ?Constitutional:  Negative for fatigue and fever.  ?HENT:  Negative for ear pain.   ?Eyes:  Negative for pain.  ?Respiratory:  Negative for cough and shortness of breath.   ?Cardiovascular:  Negative for chest pain, palpitations and leg swelling.  ?Gastrointestinal:  Negative for abdominal pain.  ?Genitourinary:  Negative for dysuria.  ?Musculoskeletal:  Negative for arthralgias.  ?Neurological:  Negative for syncope, light-headedness and headaches.  ?Psychiatric/Behavioral:  Negative for dysphoric mood.   ?Objective:  ?There were no vitals taken for this visit.  ?Wt Readings from Last 3 Encounters:  ?04/10/21 296 lb (134.3 kg)  ?03/17/21 (!) 309 lb (140.2 kg)  ?03/13/21 (!) 308 lb 2 oz (139.8 kg)  ?  ?  ?Physical Exam ?Constitutional:   ?   Appearance: He is well-developed.  ?HENT:  ?   Head: Normocephalic.  ?   Right Ear: Hearing normal.  ?   Left Ear: Hearing normal.  ?   Nose: Nose normal.  ?Neck:  ?   Thyroid: No thyroid mass or thyromegaly.  ?   Vascular: No carotid bruit.  ?   Trachea: Trachea normal.  ?Cardiovascular:  ?   Rate and Rhythm: Normal rate and regular rhythm.  ?   Pulses: Normal pulses.  ?   Heart sounds: Heart sounds not distant. No murmur heard. ?  No friction rub. No gallop.  ?   Comments: No peripheral edema ?Pulmonary:  ?   Effort: Pulmonary effort is normal. No respiratory distress.  ?   Breath sounds: Normal breath sounds.  ?Skin: ?   General: Skin is warm and dry.  ?   Findings: No rash.  ?Psychiatric:     ?   Speech: Speech normal.     ?   Behavior: Behavior normal.     ?   Thought Content: Thought content normal.  ? ?   ?Results for orders placed or performed in visit on 03/13/21  ?POCT glycosylated hemoglobin (Hb A1C)  ?Result Value Ref Range  ? Hemoglobin A1C 6.7 (A) 4.0 - 5.6 %  ? HbA1c POC (<> result, manual entry)    ? HbA1c, POC (prediabetic range)    ? HbA1c, POC (controlled diabetic range)    ? ? ?This visit occurred during the SARS-CoV-2 public health emergency.  Safety  protocols were in place, including screening questions prior to the visit, additional usage of staff PPE, and extensive cleaning of exam room while observing appropriate contact time as indicated for disinfecting solutions.  ? ?COVID 19 screen:  No recent travel  or known exposure to [place] ?The patient denies respiratory symptoms of COVID 19 at this time. ?The importance of social distancing was discussed today.  ? ?Assessment and Plan ? ?  ?Problem List Items Addressed This Visit   ? ? DM type 2 (diabetes mellitus, type 2) ([place])  ?  Chronic, improved control with weight loss on semaglutide farxiga and glipizide. ?We are increasing the semaglutide for weight management.  He has started having lower blood sugars recently.  We will decrease his farxiga dose to 5 mg daily as he would prefer to stop this medication given its cost. ?  ?  ? Relevant Medications  ? dapagliflozin propanediol (FARXIGA) 5 MG TABS tablet  ? Semaglutide, 1 MG/DOSE, 4 MG/3ML SOPN  ? Morbid obesity ([place])  ?  Chronic, significant improvement on semaglutide 0.5 mg weekly.  He has had 42 pound weight loss overall since start.  We will increase the semaglutide 1 mg weekly.  He will continue working on increased activity and decrease caloric intake.  Follow-up in 4 weeks. ?  ?  ? Relevant Medications  ? dapagliflozin propanediol (FARXIGA) 5 MG TABS tablet  ? Semaglutide, 1 MG/DOSE, 4 MG/3ML SOPN  ? ? ? ?Eliezer Lofts, MD  ? ?

## 2021-05-26 LAB — HM DIABETES FOOT EXAM

## 2021-05-28 ENCOUNTER — Telehealth: Payer: Self-pay | Admitting: Family Medicine

## 2021-05-28 DIAGNOSIS — Z125 Encounter for screening for malignant neoplasm of prostate: Secondary | ICD-10-CM

## 2021-05-28 DIAGNOSIS — M109 Gout, unspecified: Secondary | ICD-10-CM

## 2021-05-28 DIAGNOSIS — E119 Type 2 diabetes mellitus without complications: Secondary | ICD-10-CM

## 2021-05-28 NOTE — Telephone Encounter (Signed)
-----   Message from Velna Hatchet, RT sent at 05/20/2021  3:38 PM EDT ----- ?Regarding: Lab orders for Thursday, 06/05/21 ?Lab orders for appt on 06/05/21, please.   Thanks,  Anda Kraft ? ?

## 2021-06-05 ENCOUNTER — Other Ambulatory Visit (INDEPENDENT_AMBULATORY_CARE_PROVIDER_SITE_OTHER): Payer: 59

## 2021-06-05 DIAGNOSIS — E119 Type 2 diabetes mellitus without complications: Secondary | ICD-10-CM | POA: Diagnosis not present

## 2021-06-05 LAB — LIPID PANEL
Cholesterol: 108 mg/dL (ref 0–200)
HDL: 28 mg/dL — ABNORMAL LOW (ref >39.00)
LDL Cholesterol: 53 mg/dL (ref 0–99)
NonHDL: 79.99
Total CHOL/HDL Ratio: 4
Triglycerides: 134 mg/dL (ref 0.0–149.0)
VLDL: 26.8 mg/dL (ref 0.0–40.0)

## 2021-06-05 LAB — COMPREHENSIVE METABOLIC PANEL
ALT: 12 U/L (ref 0–53)
AST: 12 U/L (ref 0–37)
Albumin: 4.1 g/dL (ref 3.5–5.2)
Alkaline Phosphatase: 134 U/L — ABNORMAL HIGH (ref 39–117)
BUN: 44 mg/dL — ABNORMAL HIGH (ref 6–23)
CO2: 26 mEq/L (ref 19–32)
Calcium: 9.5 mg/dL (ref 8.4–10.5)
Chloride: 103 mEq/L (ref 96–112)
Creatinine, Ser: 2.52 mg/dL — ABNORMAL HIGH (ref 0.40–1.50)
GFR: 27 mL/min — ABNORMAL LOW (ref >60.00)
Glucose, Bld: 113 mg/dL — ABNORMAL HIGH (ref 70–99)
Potassium: 5.3 mEq/L — ABNORMAL HIGH (ref 3.5–5.1)
Sodium: 138 mEq/L (ref 135–145)
Total Bilirubin: 0.6 mg/dL (ref 0.2–1.2)
Total Protein: 6.4 g/dL (ref 6.0–8.3)

## 2021-06-05 LAB — HEMOGLOBIN A1C: Hgb A1c MFr Bld: 6 % (ref 4.6–6.5)

## 2021-06-13 ENCOUNTER — Encounter: Payer: Self-pay | Admitting: Family Medicine

## 2021-06-13 ENCOUNTER — Ambulatory Visit: Payer: 59 | Admitting: Family Medicine

## 2021-06-13 VITALS — BP 130/76 | HR 81 | Temp 97.4°F | Ht 71.0 in | Wt 270.1 lb

## 2021-06-13 DIAGNOSIS — E785 Hyperlipidemia, unspecified: Secondary | ICD-10-CM

## 2021-06-13 DIAGNOSIS — E1159 Type 2 diabetes mellitus with other circulatory complications: Secondary | ICD-10-CM | POA: Diagnosis not present

## 2021-06-13 DIAGNOSIS — E1169 Type 2 diabetes mellitus with other specified complication: Secondary | ICD-10-CM | POA: Diagnosis not present

## 2021-06-13 DIAGNOSIS — I152 Hypertension secondary to endocrine disorders: Secondary | ICD-10-CM

## 2021-06-13 DIAGNOSIS — E119 Type 2 diabetes mellitus without complications: Secondary | ICD-10-CM

## 2021-06-13 DIAGNOSIS — N028 Recurrent and persistent hematuria with other morphologic changes: Secondary | ICD-10-CM | POA: Diagnosis not present

## 2021-06-13 DIAGNOSIS — I10 Essential (primary) hypertension: Secondary | ICD-10-CM

## 2021-06-13 DIAGNOSIS — Z6837 Body mass index (BMI) 37.0-37.9, adult: Secondary | ICD-10-CM

## 2021-06-13 NOTE — Progress Notes (Signed)
? ? Patient ID: Richard Giles, male    DOB: July 24, 1960, 61 y.o.   MRN: [id number] ? ?This visit was conducted in person. ? ?BP 130/76 (BP Location: Left Arm, Patient Position: Sitting, Cuff Size: Large)   Pulse 81   Temp (!) 97.4 ?F (36.3 ?C) (Oral)   Ht _0  (1.803 m)   Wt 270 lb 1.6 oz (122.5 kg)   SpO2 96%   BMI 37.67 kg/m?   ? ?CC: ?Chief Complaint  ?Patient presents with  ? Diabetes  ? ? ?Subjective:  ? ?HPI: ?Richard Giles is a 61 y.o. male presenting on 06/13/2021 for Diabetes ? ? ?Morbid obesity associated with type 2 diabetes ?Presents for weight management.  On 3/16 we increased his semaglutide to 1 mg weekly ?He has lost 15 pounds in the last month ?   No  SE to medication ? ? He has increased exercise.. he coaches baseball, more active participant. ?Doing yard work and walking regularly. ?Doing well with portion size and no bread, less sugar. ?Wt Readings from Last 3 Encounters:  ?06/13/21 270 lb 1.6 oz (122.5 kg)  ?05/08/21 285 lb 6.4 oz (129.5 kg)  ?04/10/21 296 lb (134.3 kg)  ? ?Starting weight 02/13/2022 327 lbs ?Weight loss to date 57 pounds ?Body mass index is 37.67 kg/m?. ? ?Diabetes:   Improving control. ?Lab Results  ?Component Value Date  ? HGBA1C 6.0 06/05/2021  ?Using medications without difficulties: ?Hypoglycemic episodes: ?Hyperglycemic episodes: ?Feet problems: none ?Blood Sugars averaging: ?eye exam within last year:  ? ? Slightly elevated potassium  5.3 ? Renal function has declined slightly.. has appt for lab re-eval in 1-2 weeks and appt. ? ?Relevant past medical, surgical, family and social history reviewed and updated as indicated. Interim medical history since our last visit reviewed. ?Allergies and medications reviewed and updated. ?Outpatient Medications Prior to Visit  ?Medication Sig Dispense Refill  ? ACCU-CHEK GUIDE test strip TEST BLOOD SUGAR EVERY DAY 100 strip 3  ? Accu-Chek Softclix Lancets lancets TEST BLOOD SUGAR ONCE DAILY    ? acetaminophen (TYLENOL) 500 MG  tablet Take 1,000 mg by mouth every 6 (six) hours as needed (pain).    ? albuterol (VENTOLIN HFA) 108 (90 Base) MCG/ACT inhaler Inhale 2 puffs into the lungs every 4 (four) hours as needed for wheezing or shortness of breath. 1 each 3  ? allopurinol (ZYLOPRIM) 300 MG tablet TAKE 1 TABLET BY MOUTH EVERY DAY 90 tablet 3  ? amLODipine (NORVASC) 10 MG tablet TAKE 1 TABLET BY MOUTH EVERY DAY 90 tablet 1  ? aspirin 81 MG tablet Take 81 mg by mouth daily.    ? atorvastatin (LIPITOR) 40 MG tablet Take 1 tablet (40 mg total) by mouth daily. 90 tablet 3  ? Blood Glucose Monitoring Suppl (ACCU-CHEK GUIDE ME) w/Device KIT daily.    ? budesonide-formoterol (SYMBICORT) 160-4.5 MCG/ACT inhaler Inhale 2 puffs into the lungs in the morning and at bedtime. 10.2 g 6  ? carvedilol (COREG) 6.25 MG tablet Take 1 tablet (6.25 mg total) by mouth 2 (two) times daily with a meal. 60 tablet 11  ? cetirizine (ZYRTEC) 10 MG tablet Take 1 tablet (10 mg total) by mouth daily. 14 tablet 0  ? Cholecalciferol (VITAMIN D3 PO) Take 1 tablet by mouth daily.    ? cyclobenzaprine (FLEXERIL) 10 MG tablet Take 1 tablet (10 mg total) by mouth 3 (three) times daily as needed for muscle spasms. 20 tablet 0  ? dapagliflozin propanediol (FARXIGA) 5 MG  TABS tablet Take 1 tablet (5 mg total) by mouth daily. 30 tablet 3  ? famotidine (PEPCID) 20 MG tablet Take 20 mg by mouth at bedtime.    ? fexofenadine (ALLEGRA) 180 MG tablet Take 180 mg by mouth daily.     ? FLUoxetine (PROZAC) 20 MG capsule TAKE 1 CAPSULE BY MOUTH EVERY DAY 90 capsule 1  ? furosemide (LASIX) 20 MG tablet TAKE 1 TABLET BY MOUTH EVERY DAY AS NEEDED 90 tablet 1  ? glipiZIDE (GLUCOTROL XL) 10 MG 24 hr tablet Take 1 tablet (10 mg total) by mouth daily with breakfast. 90 tablet 1  ? irbesartan (AVAPRO) 300 MG tablet TAKE 1 TABLET BY MOUTH EVERY DAY 90 tablet 3  ? Probiotic Product (PROBIOTIC PO) Take 1 tablet by mouth daily.    ? promethazine-dextromethorphan (PROMETHAZINE-DM) 6.25-15 MG/5ML syrup  Take 5 mLs by mouth at bedtime as needed for cough. 118 mL 0  ? Semaglutide, 1 MG/DOSE, 4 MG/3ML SOPN Inject 1 mg as directed once a week. 3 mL 11  ? sodium chloride (OCEAN) 0.65 % SOLN nasal spray Place 1 spray into both nostrils as needed for congestion. 15 mL 0  ? ?No facility-administered medications prior to visit.  ?  ? ?Per HPI unless specifically indicated in ROS section below ?Review of Systems  ?Constitutional:  Negative for fatigue and fever.  ?HENT:  Negative for ear pain.   ?Eyes:  Negative for pain.  ?Respiratory:  Negative for cough and shortness of breath.   ?Cardiovascular:  Negative for chest pain, palpitations and leg swelling.  ?Gastrointestinal:  Negative for abdominal pain.  ?Genitourinary:  Negative for dysuria.  ?Musculoskeletal:  Negative for arthralgias.  ?Neurological:  Negative for syncope, light-headedness and headaches.  ?Psychiatric/Behavioral:  Negative for dysphoric mood.   ?Objective:  ?BP 130/76 (BP Location: Left Arm, Patient Position: Sitting, Cuff Size: Large)   Pulse 81   Temp (!) 97.4 ?F (36.3 ?C) (Oral)   Ht _0  (1.803 m)   Wt 270 lb 1.6 oz (122.5 kg)   SpO2 96%   BMI 37.67 kg/m?   ?Wt Readings from Last 3 Encounters:  ?06/13/21 270 lb 1.6 oz (122.5 kg)  ?05/08/21 285 lb 6.4 oz (129.5 kg)  ?04/10/21 296 lb (134.3 kg)  ?  ?  ?Physical Exam ?Constitutional:   ?   Appearance: He is well-developed.  ?HENT:  ?   Head: Normocephalic.  ?   Right Ear: Hearing normal.  ?   Left Ear: Hearing normal.  ?   Nose: Nose normal.  ?Neck:  ?   Thyroid: No thyroid mass or thyromegaly.  ?   Vascular: No carotid bruit.  ?   Trachea: Trachea normal.  ?Cardiovascular:  ?   Rate and Rhythm: Normal rate and regular rhythm.  ?   Pulses: Normal pulses.  ?   Heart sounds: Heart sounds not distant. No murmur heard. ?  No friction rub. No gallop.  ?   Comments: No peripheral edema ?Pulmonary:  ?   Effort: Pulmonary effort is normal. No respiratory distress.  ?   Breath sounds: Normal breath  sounds.  ?Skin: ?   General: Skin is warm and dry.  ?   Findings: No rash.  ?Psychiatric:     ?   Speech: Speech normal.     ?   Behavior: Behavior normal.     ?   Thought Content: Thought content normal.  ? ?Diabetic foot exam: ?Normal inspection ?No skin breakdown ?No calluses  ?  Normal DP pulses ?Normal sensation to light touch and monofilament ?Nails normal ? ?   ?Results for orders placed or performed in visit on 06/05/21  ?Comprehensive metabolic panel  ?Result Value Ref Range  ? Sodium 138 135 - 145 mEq/L  ? Potassium 5.3 No hemolysis seen (H) 3.5 - 5.1 mEq/L  ? Chloride 103 96 - 112 mEq/L  ? CO2 26 19 - 32 mEq/L  ? Glucose, Bld 113 (H) 70 - 99 mg/dL  ? BUN 44 (H) 6 - 23 mg/dL  ? Creatinine, Ser 2.52 (H) 0.40 - 1.50 mg/dL  ? Total Bilirubin 0.6 0.2 - 1.2 mg/dL  ? Alkaline Phosphatase 134 (H) 39 - 117 U/L  ? AST 12 0 - 37 U/L  ? ALT 12 0 - 53 U/L  ? Total Protein 6.4 6.0 - 8.3 g/dL  ? Albumin 4.1 3.5 - 5.2 g/dL  ? GFR 27.00 (L) >60.00 mL/min  ? Calcium 9.5 8.4 - 10.5 mg/dL  ?Lipid panel  ?Result Value Ref Range  ? Cholesterol 108 0 - 200 mg/dL  ? Triglycerides 134.0 0.0 - 149.0 mg/dL  ? HDL 28.00 (L) >39.00 mg/dL  ? VLDL 26.8 0.0 - 40.0 mg/dL  ? LDL Cholesterol 53 0 - 99 mg/dL  ? Total CHOL/HDL Ratio 4   ? NonHDL 79.99   ?Hemoglobin A1c  ?Result Value Ref Range  ? Hgb A1c MFr Bld 6.0 4.6 - 6.5 %  ? ? ?This visit occurred during the SARS-CoV-2 public health emergency.  Safety protocols were in place, including screening questions prior to the visit, additional usage of staff PPE, and extensive cleaning of exam room while observing appropriate contact time as indicated for disinfecting solutions.  ? ?COVID 19 screen:  No recent travel or known exposure to [place] ?The patient denies respiratory symptoms of COVID 19 at this time. ?The importance of social distancing was discussed today.  ? ?Assessment and Plan ? ?  ?Problem List Items Addressed This Visit   ? ? IgA nephropathy determined by biopsy of kidney  (Chronic)  ?  Chronic, worsened creatinine ? ?Recent slightly elevated potassium as well as worsened GFR.  He seems to fluctuate like this.  No clear sign of acute kidney issue associated with semaglutide. ?He has follow-up in 1

## 2021-06-13 NOTE — Patient Instructions (Signed)
Keep  up with healthy eating and regular exercise. ? Continue current dose of semaglutide. ? Keep follow up as planned with  renal MD. ? Keep up with water intake when exercising. ? ?

## 2021-06-13 NOTE — Progress Notes (Signed)
Due for foot exam and will get eye exam in couple mnths ?

## 2021-06-13 NOTE — Assessment & Plan Note (Addendum)
Chronic, well controlled ?Atorvastatin 40 mg daily ? ? ?

## 2021-06-13 NOTE — Assessment & Plan Note (Addendum)
Chronic, improved control ? ?Significant improvement with the semaglutide 1 mg weekly. ?Continue Farxiga 5 mg daily, glipizide XL 10 mg daily..  Can lower A1c we may be able to stop 1 of these medications.  I did not talk about this at the office visit but will message the patient to consider. ?

## 2021-06-13 NOTE — Assessment & Plan Note (Signed)
Chronic, well controlled ? ?Coreg 6.25 mg twice daily ?Avapro 300 mg p.o. daily ?Amlodipine 10 mg daily ?

## 2021-06-13 NOTE — Assessment & Plan Note (Signed)
Chronic, worsened creatinine ? ?Recent slightly elevated potassium as well as worsened GFR.  He seems to fluctuate like this.  No clear sign of acute kidney issue associated with semaglutide. ?He has follow-up in 1 to 2 weeks with renal and will have repeat testing prior. ?

## 2021-07-09 ENCOUNTER — Other Ambulatory Visit: Payer: Self-pay | Admitting: Family Medicine

## 2021-07-15 ENCOUNTER — Ambulatory Visit: Payer: 59 | Admitting: Family Medicine

## 2021-07-15 ENCOUNTER — Encounter: Payer: Self-pay | Admitting: Family Medicine

## 2021-07-15 DIAGNOSIS — E1159 Type 2 diabetes mellitus with other circulatory complications: Secondary | ICD-10-CM

## 2021-07-15 DIAGNOSIS — G4733 Obstructive sleep apnea (adult) (pediatric): Secondary | ICD-10-CM | POA: Diagnosis not present

## 2021-07-15 DIAGNOSIS — I152 Hypertension secondary to endocrine disorders: Secondary | ICD-10-CM

## 2021-07-15 DIAGNOSIS — N1831 Chronic kidney disease, stage 3a: Secondary | ICD-10-CM

## 2021-07-15 DIAGNOSIS — E119 Type 2 diabetes mellitus without complications: Secondary | ICD-10-CM

## 2021-07-15 DIAGNOSIS — Z6836 Body mass index (BMI) 36.0-36.9, adult: Secondary | ICD-10-CM

## 2021-07-15 DIAGNOSIS — M109 Gout, unspecified: Secondary | ICD-10-CM

## 2021-07-15 DIAGNOSIS — N028 Recurrent and persistent hematuria with other morphologic changes: Secondary | ICD-10-CM | POA: Diagnosis not present

## 2021-07-15 NOTE — Progress Notes (Signed)
Patient ID: Richard Giles, male    DOB: 1960-06-29, 61 y.o.   MRN: [id number]  This visit was conducted in person.  BP 100/60   Pulse 77   Temp (!) 97.4 F (36.3 C) (Oral)   Ht 5' 11"  (1.803 m)   Wt 265 lb 4 oz (120.3 kg)   SpO2 95%   BMI 36.99 kg/m    CC:  Chief Complaint  Patient presents with   Weight Management Screening    Subjective:   HPI: Richard Giles is a 61 y.o. male presenting on 07/15/2021 for Weight Management Screening  Morbid obesity with co-morbidities including DM, HTN and sleep apnea Starting weight 327  BMI 45.9  Total weight loss 62 lbs  5 lbs in last month   No SE to semglutide. Wt Readings from Last 3 Encounters:  07/15/21 265 lb 4 oz (120.3 kg)  06/13/21 270 lb 1.6 oz (122.5 kg)  05/08/21 285 lb 6.4 oz (129.5 kg)  Body mass index is 36.99 kg/m.  DM On glipizide xl 10 mg daily and farxiga 5 mg daily FBS/postprandial: not checking lately.Marland Kitchen nonfasting at labs on 5/3 66!   Occ dizziness with sitting to standing x 1 month. Lab Results  Component Value Date   HGBA1C 6.0 06/05/2021      HTN: low normal on Amlodipine 10 mg daily, Avapro 300 mg daily and coreg 6.25 mg BID Using lasix 20 mg prn  Has appt reschedule with renal for   BP Readings from Last 3 Encounters:  07/15/21 100/60  06/13/21 130/76  05/08/21 110/70   Reviewed labs draw by renal.. has not seen them yet. Had to reschedule.  Relevant past medical, surgical, family and social history reviewed and updated as indicated. Interim medical history since our last visit reviewed. Allergies and medications reviewed and updated. Outpatient Medications Prior to Visit  Medication Sig Dispense Refill   ACCU-CHEK GUIDE test strip TEST BLOOD SUGAR EVERY DAY 100 strip 3   Accu-Chek Softclix Lancets lancets TEST BLOOD SUGAR ONCE DAILY     acetaminophen (TYLENOL) 500 MG tablet Take 1,000 mg by mouth every 6 (six) hours as needed (pain).     albuterol (VENTOLIN HFA) 108 (90 Base) MCG/ACT  inhaler Inhale 2 puffs into the lungs every 4 (four) hours as needed for wheezing or shortness of breath. 1 each 3   allopurinol (ZYLOPRIM) 300 MG tablet TAKE 1 TABLET BY MOUTH EVERY DAY 90 tablet 3   amLODipine (NORVASC) 10 MG tablet TAKE 1 TABLET BY MOUTH EVERY DAY 90 tablet 1   aspirin 81 MG tablet Take 81 mg by mouth daily.     atorvastatin (LIPITOR) 40 MG tablet Take 1 tablet (40 mg total) by mouth daily. 90 tablet 3   Blood Glucose Monitoring Suppl (ACCU-CHEK GUIDE ME) w/Device KIT daily.     budesonide-formoterol (SYMBICORT) 160-4.5 MCG/ACT inhaler Inhale 2 puffs into the lungs in the morning and at bedtime. 10.2 g 6   carvedilol (COREG) 6.25 MG tablet Take 1 tablet (6.25 mg total) by mouth 2 (two) times daily with a meal. 60 tablet 11   cetirizine (ZYRTEC) 10 MG tablet Take 1 tablet (10 mg total) by mouth daily. 14 tablet 0   Cholecalciferol (VITAMIN D3 PO) Take 1 tablet by mouth daily.     cyclobenzaprine (FLEXERIL) 10 MG tablet Take 1 tablet (10 mg total) by mouth 3 (three) times daily as needed for muscle spasms. 20 tablet 0   dapagliflozin propanediol (FARXIGA) 5  MG TABS tablet Take 1 tablet (5 mg total) by mouth daily. 30 tablet 3   famotidine (PEPCID) 20 MG tablet Take 20 mg by mouth at bedtime.     fexofenadine (ALLEGRA) 180 MG tablet Take 180 mg by mouth daily.      FLUoxetine (PROZAC) 20 MG capsule TAKE 1 CAPSULE BY MOUTH EVERY DAY 90 capsule 1   furosemide (LASIX) 20 MG tablet TAKE 1 TABLET BY MOUTH EVERY DAY AS NEEDED 90 tablet 1   glipiZIDE (GLUCOTROL XL) 10 MG 24 hr tablet TAKE 1 TABLET (10 MG TOTAL) BY MOUTH DAILY WITH BREAKFAST. 90 tablet 1   irbesartan (AVAPRO) 300 MG tablet TAKE 1 TABLET BY MOUTH EVERY DAY 90 tablet 3   Probiotic Product (PROBIOTIC PO) Take 1 tablet by mouth daily.     promethazine-dextromethorphan (PROMETHAZINE-DM) 6.25-15 MG/5ML syrup Take 5 mLs by mouth at bedtime as needed for cough. 118 mL 0   Semaglutide, 1 MG/DOSE, 4 MG/3ML SOPN Inject 1 mg as  directed once a week. 3 mL 11   sodium chloride (OCEAN) 0.65 % SOLN nasal spray Place 1 spray into both nostrils as needed for congestion. 15 mL 0   No facility-administered medications prior to visit.     Per HPI unless specifically indicated in ROS section below Review of Systems  Constitutional:  Negative for fatigue and fever.  HENT:  Negative for ear pain.   Eyes:  Negative for pain.  Respiratory:  Negative for cough and shortness of breath.   Cardiovascular:  Negative for chest pain, palpitations and leg swelling.  Gastrointestinal:  Negative for abdominal pain.  Genitourinary:  Negative for dysuria.  Musculoskeletal:  Negative for arthralgias.  Neurological:  Negative for syncope, light-headedness and headaches.  Psychiatric/Behavioral:  Negative for dysphoric mood.   Objective:  BP 100/60   Pulse 77   Temp (!) 97.4 F (36.3 C) (Oral)   Ht 5' 11"  (1.803 m)   Wt 265 lb 4 oz (120.3 kg)   SpO2 95%   BMI 36.99 kg/m   Wt Readings from Last 3 Encounters:  07/15/21 265 lb 4 oz (120.3 kg)  06/13/21 270 lb 1.6 oz (122.5 kg)  05/08/21 285 lb 6.4 oz (129.5 kg)      Physical Exam Constitutional:      Appearance: He is well-developed.  HENT:     Head: Normocephalic.     Right Ear: Hearing normal.     Left Ear: Hearing normal.     Nose: Nose normal.  Neck:     Thyroid: No thyroid mass or thyromegaly.     Vascular: No carotid bruit.     Trachea: Trachea normal.  Cardiovascular:     Rate and Rhythm: Normal rate and regular rhythm.     Pulses: Normal pulses.     Heart sounds: Heart sounds not distant. No murmur heard.   No friction rub. No gallop.     Comments: No peripheral edema Pulmonary:     Effort: Pulmonary effort is normal. No respiratory distress.     Breath sounds: Normal breath sounds.  Skin:    General: Skin is warm and dry.     Findings: No rash.  Psychiatric:        Speech: Speech normal.        Behavior: Behavior normal.        Thought Content: Thought  content normal.      Results for orders placed or performed in visit on 06/13/21  HM DIABETES FOOT EXAM  Result Value Ref Range   HM Diabetic Foot Exam done      COVID 19 screen:  No recent travel or known exposure to COVID19 The patient denies respiratory symptoms of COVID 19 at this time. The importance of social distancing was discussed today.   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 recent low BPS and lightheadedness.  Continue coreg 6.25 mg BID,  Irbesartan 300 mg daily, but decreased amlodipine to 5 mg daily.       Relevant Medications   amLODipine (NORVASC) 10 MG tablet   Morbid obesity (HCC)    BMI now 34.. continue semaglutide 1 mg weekly given no SE and continued weight loss.  Follow up in 1 month.       Sleep apnea    New diagnosis, now on CPAP.  Consider re-eval as weight loss continues.       Type 2 diabetes mellitus without complication (HCC)     Chronic, Improved control with recent low blood sugar on routine labs of 66.  Stop glipizide and continue farxiga 5 mg daily.  Follow CBGs at home, reviewed goals.  Continue semaglutide 1 mg weekly.           Eliezer Lofts, MD

## 2021-07-15 NOTE — Assessment & Plan Note (Signed)
Chronic, recent GFR and Cr slightly improved from 05/2021  Has follow up reschedule with renal to 07/2021

## 2021-07-15 NOTE — Assessment & Plan Note (Signed)
New diagnosis, now on CPAP.  Consider re-eval as weight loss continues.

## 2021-07-15 NOTE — Assessment & Plan Note (Signed)
Chronic.. recent low BPS and lightheadedness.  Continue coreg 6.25 mg BID,  Irbesartan 300 mg daily, but decreased amlodipine to 5 mg daily.

## 2021-07-15 NOTE — Assessment & Plan Note (Addendum)
BMI now 36... continue semaglutide 1 mg weekly given no SE and continued weight loss.  Follow up in 1 month.

## 2021-07-15 NOTE — Assessment & Plan Note (Signed)
Chronic, Improved control with recent low blood sugar on routine labs of 66.  Stop glipizide and continue farxiga 5 mg daily.  Follow CBGs at home, reviewed goals.  Continue semaglutide 1 mg weekly.

## 2021-07-15 NOTE — Assessment & Plan Note (Signed)
Stable  per uric acid level 06/2021, no flares

## 2021-07-15 NOTE — Patient Instructions (Addendum)
Stop glipizide, check blood sugars fasting and occ after meals. Goal  80-120 fasting, after meal goal < 180.  Decrease amlodipine to 5 mg daily given low blood pressures and lightheadedness... goal BP 90/60 to 140/90.

## 2021-07-29 ENCOUNTER — Other Ambulatory Visit: Payer: Self-pay | Admitting: Family Medicine

## 2021-08-17 ENCOUNTER — Other Ambulatory Visit: Payer: Self-pay | Admitting: Family Medicine

## 2021-08-17 DIAGNOSIS — I10 Essential (primary) hypertension: Secondary | ICD-10-CM

## 2021-08-19 ENCOUNTER — Ambulatory Visit: Payer: 59 | Admitting: Family Medicine

## 2021-08-19 ENCOUNTER — Encounter: Payer: Self-pay | Admitting: Family Medicine

## 2021-08-19 DIAGNOSIS — Z6836 Body mass index (BMI) 36.0-36.9, adult: Secondary | ICD-10-CM

## 2021-08-19 DIAGNOSIS — E1159 Type 2 diabetes mellitus with other circulatory complications: Secondary | ICD-10-CM

## 2021-08-19 DIAGNOSIS — E119 Type 2 diabetes mellitus without complications: Secondary | ICD-10-CM

## 2021-08-19 DIAGNOSIS — I152 Hypertension secondary to endocrine disorders: Secondary | ICD-10-CM

## 2021-08-19 MED ORDER — KETOCONAZOLE 2 % EX CREA: 1.0000 | TOPICAL_CREAM | Freq: Every day | CUTANEOUS | 0 refills | Status: DC

## 2021-08-19 MED ORDER — SEMAGLUTIDE (2 MG/DOSE) 8 MG/3ML ~~LOC~~ SOPN: 2.0000 mg | PEN_INJECTOR | SUBCUTANEOUS | 3 refills | Status: DC

## 2021-08-19 MED ORDER — TRIAMCINOLONE ACETONIDE 0.5 % EX CREA: 1.0000 | TOPICAL_CREAM | Freq: Two times a day (BID) | CUTANEOUS | 0 refills | Status: DC

## 2021-08-19 NOTE — Progress Notes (Signed)
Patient ID: Richard Giles, male    DOB: [birth year], 61 y.o.   MRN: [id number]  This visit was conducted in person.  BP 130/64   Pulse 80   Temp 98.2 F (36.8 C) (Oral)   Ht 5' 11" (1.803 m)   Wt 262 lb 4 oz (119 kg)   SpO2 96%   BMI 36.58 kg/m    CC:  Chief Complaint  Patient presents with   Weight Management Screening    Subjective:   HPI: Richard Giles is a 61 y.o. male presenting on 08/19/2021 for Weight Management Screening  Morbid obesity with co-morbidities including DM, HTN and sleep apnea Starting weight 327  BMI 45.9    Had followed up with renal MD last week.  Diabetes:  On semaglutide 1 mg daily. At last OV stopped glipizide and continued Farxiga 5 mg daily. Using medications without difficulties: Hypoglycemic episodes: none Hyperglycemic episodes: none Feet problems: no ulcers Blood Sugars averaging: FBS 80-120, 2 hour post prandial < 180 eye exam within last year:   3 lb weight loss in last month.  Total weight loss to date:  65 lbs Wt Readings from Last 3 Encounters:  08/19/21 262 lb 4 oz (119 kg)  07/15/21 265 lb 4 oz (120.3 kg)  06/13/21 270 lb 1.6 oz (122.5 kg)   Body mass index is 36.58 kg/m.  Walking and riding bike daily.    Hypertension:  Good control on coreg 6.25 mg BID,  Irbesartan 300 mg daily, but decreased amlodipine to 5 mg daily. BP Readings from Last 3 Encounters:  08/19/21 130/64  07/15/21 100/60  06/13/21 130/76  Using medication without problems or lightheadedness:   occ Chest pain with exertion: none Edema:none Short of breath: none Average home BPs: 120/60s Other issues:   Relevant past medical, surgical, family and social history reviewed and updated as indicated. Interim medical history since our last visit reviewed. Allergies and medications reviewed and updated. Outpatient Medications Prior to Visit  Medication Sig Dispense Refill   ACCU-CHEK GUIDE test strip TEST BLOOD SUGAR EVERY DAY 100 strip 3   Accu-Chek  Softclix Lancets lancets TEST BLOOD SUGAR ONCE DAILY     acetaminophen (TYLENOL) 500 MG tablet Take 1,000 mg by mouth every 6 (six) hours as needed (pain).     albuterol (VENTOLIN HFA) 108 (90 Base) MCG/ACT inhaler Inhale 2 puffs into the lungs every 4 (four) hours as needed for wheezing or shortness of breath. 1 each 3   allopurinol (ZYLOPRIM) 300 MG tablet TAKE 1 TABLET BY MOUTH EVERY DAY 90 tablet 3   amLODipine (NORVASC) 5 MG tablet Take 5 mg by mouth daily.     aspirin 81 MG tablet Take 81 mg by mouth daily.     atorvastatin (LIPITOR) 40 MG tablet Take 1 tablet (40 mg total) by mouth daily. 90 tablet 3   Blood Glucose Monitoring Suppl (ACCU-CHEK GUIDE ME) w/Device KIT daily.     budesonide-formoterol (SYMBICORT) 160-4.5 MCG/ACT inhaler Inhale 2 puffs into the lungs in the morning and at bedtime. 10.2 g 6   carvedilol (COREG) 6.25 MG tablet Take 1 tablet (6.25 mg total) by mouth 2 (two) times daily with a meal. 60 tablet 11   cetirizine (ZYRTEC) 10 MG tablet Take 1 tablet (10 mg total) by mouth daily. 14 tablet 0   Cholecalciferol (VITAMIN D3 PO) Take 1 tablet by mouth daily.     cyclobenzaprine (FLEXERIL) 10 MG tablet Take 1 tablet (10 mg total) by  mouth 3 (three) times daily as needed for muscle spasms. 20 tablet 0   dapagliflozin propanediol (FARXIGA) 5 MG TABS tablet Take 1 tablet (5 mg total) by mouth daily. 30 tablet 3   famotidine (PEPCID) 20 MG tablet Take 20 mg by mouth at bedtime.     fexofenadine (ALLEGRA) 180 MG tablet Take 180 mg by mouth daily.      FLUoxetine (PROZAC) 20 MG capsule TAKE 1 CAPSULE BY MOUTH EVERY DAY 90 capsule 1   furosemide (LASIX) 20 MG tablet TAKE 1 TABLET BY MOUTH EVERY DAY AS NEEDED 90 tablet 1   irbesartan (AVAPRO) 300 MG tablet TAKE 1 TABLET BY MOUTH EVERY DAY 90 tablet 3   Probiotic Product (PROBIOTIC PO) Take 1 tablet by mouth daily.     Semaglutide, 1 MG/DOSE, 4 MG/3ML SOPN Inject 1 mg as directed once a week. 3 mL 11   sodium chloride (OCEAN) 0.65 %  SOLN nasal spray Place 1 spray into both nostrils as needed for congestion. 15 mL 0   amLODipine (NORVASC) 10 MG tablet Take 0.5 tablets (5 mg total) by mouth daily. 45 tablet 1   No facility-administered medications prior to visit.     Per HPI unless specifically indicated in ROS section below Review of Systems  Constitutional:  Negative for fatigue and fever.  HENT:  Negative for ear pain.   Eyes:  Negative for pain.  Respiratory:  Negative for cough and shortness of breath.   Cardiovascular:  Negative for chest pain, palpitations and leg swelling.  Gastrointestinal:  Negative for abdominal pain.  Genitourinary:  Negative for dysuria.  Musculoskeletal:  Negative for arthralgias.  Neurological:  Negative for syncope, light-headedness and headaches.  Psychiatric/Behavioral:  Negative for dysphoric mood.    Objective:  BP 130/64   Pulse 80   Temp 98.2 F (36.8 C) (Oral)   Ht 5' 11" (1.803 m)   Wt 262 lb 4 oz (119 kg)   SpO2 96%   BMI 36.58 kg/m   Wt Readings from Last 3 Encounters:  08/19/21 262 lb 4 oz (119 kg)  07/15/21 265 lb 4 oz (120.3 kg)  06/13/21 270 lb 1.6 oz (122.5 kg)      Physical Exam Constitutional:      Appearance: He is well-developed.  HENT:     Head: Normocephalic.     Right Ear: Hearing normal.     Left Ear: Hearing normal.     Nose: Nose normal.  Neck:     Thyroid: No thyroid mass or thyromegaly.     Vascular: No carotid bruit.     Trachea: Trachea normal.  Cardiovascular:     Rate and Rhythm: Normal rate and regular rhythm.     Pulses: Normal pulses.     Heart sounds: Heart sounds not distant. No murmur heard.    No friction rub. No gallop.     Comments: No peripheral edema Pulmonary:     Effort: Pulmonary effort is normal. No respiratory distress.     Breath sounds: Normal breath sounds.  Skin:    General: Skin is warm and dry.     Findings: No rash.  Psychiatric:        Speech: Speech normal.        Behavior: Behavior normal.         Thought Content: Thought content normal.       Results for orders placed or performed in visit on 06/13/21  HM DIABETES FOOT EXAM  Result Value Ref Range  HM Diabetic Foot Exam done      COVID 19 screen:  No recent travel or known exposure to COVID19 The patient denies respiratory symptoms of COVID 19 at this time. The importance of social distancing was discussed today.   Assessment and Plan Problem List Items Addressed This Visit     Type 2 diabetes mellitus without complication (HCC) (Chronic)    Chronic, well controlled fasting blood sugars and 2-hour postprandials at goal.  Increase semaglutide to 2 mg weekly.  Remain off glipizide and continue low-dose Farxiga 5 mg p.o. daily.      Relevant Medications   Semaglutide, 2 MG/DOSE, 8 MG/3ML SOPN   Hypertension associated with diabetes ([place])    Good control on coreg 6.25 mg BID,  Irbesartan 300 mg daily, but decreased amlodipine to 5 mg daily.      Relevant Medications   amLODipine (NORVASC) 5 MG tablet   Semaglutide, 2 MG/DOSE, 8 MG/3ML SOPN   Severe obesity with body mass index (BMI) of 36.0 to 36.9 with serious comorbidity (HCC)    Chronic, significant weight loss with semaglutide.  Tolerating well.  Given plateau of weight loss in the last month we will increase  semaglutide to 2 mg weekly. Encouraged exercise, weight loss, healthy eating habits. Follow-up in 1 month.      Relevant Medications   Semaglutide, 2 MG/DOSE, 8 MG/3ML SOPN   Meds ordered this encounter  Medications   Semaglutide, 2 MG/DOSE, 8 MG/3ML SOPN    Sig: Inject 2 mg as directed once a week.    Dispense:  9 mL    Refill:  3   triamcinolone cream (KENALOG) 0.5 %    Sig: Apply 1 Application topically 2 (two) times daily.    Dispense:  30 g    Refill:  0   ketoconazole (NIZORAL) 2 % cream    Sig: Apply 1 Application topically daily.    Dispense:  15 g    Refill:  0       Eliezer Lofts, MD

## 2021-08-26 ENCOUNTER — Other Ambulatory Visit: Payer: Self-pay | Admitting: Family Medicine

## 2021-08-29 ENCOUNTER — Ambulatory Visit: Payer: 59 | Admitting: Family Medicine

## 2021-09-05 ENCOUNTER — Other Ambulatory Visit: Payer: Self-pay | Admitting: Family Medicine

## 2021-09-05 NOTE — Telephone Encounter (Signed)
Refill request Wilder Glade Last refill 05/08/21 #30/3 Last office visit 08/19/21

## 2021-09-07 ENCOUNTER — Encounter: Payer: Self-pay | Admitting: *Deleted

## 2021-09-08 NOTE — Telephone Encounter (Signed)
Per David's MyChart response:  I guess based on Dr. Debroah Loop comments that we should go back to a dosage of '10mg'$ .  I am still experiencing dizziness and my blood pressure is still low. She suggested that I cut my carvedilol 6.25 MG tablets in half if it didn't improve. So, I started this week.  We will see if it helps.  Thanks!  If okay to send in [place] 10 mg, please sign order below.

## 2021-09-12 ENCOUNTER — Other Ambulatory Visit: Payer: Self-pay | Admitting: Family Medicine

## 2021-09-15 ENCOUNTER — Encounter: Payer: Self-pay | Admitting: Internal Medicine

## 2021-09-15 ENCOUNTER — Ambulatory Visit (INDEPENDENT_AMBULATORY_CARE_PROVIDER_SITE_OTHER): Payer: 59 | Admitting: Internal Medicine

## 2021-09-15 DIAGNOSIS — J45991 Cough variant asthma: Secondary | ICD-10-CM | POA: Diagnosis not present

## 2021-09-15 NOTE — Patient Instructions (Signed)
Plan A = Automatic = Always=    symbicort 160 up to 2 puffs every 12 hours and down to 1 puff in am - immediately go back to max dose if having symptoms or needing rescue  Plan B = Backup (to supplement plan A, not to replace it) Only use your albuterol inhaler as a rescue medication to be used if you can't catch your breath by resting or doing a relaxed purse lip breathing pattern.  - The less you use it, the better it will work when you need it. - Ok to use the inhaler up to 2 puffs  every 4 hours if you must but call for appointment if use goes up over your usual need - Don't leave home without it !!  (think of it like the spare tire for your car)    Please schedule a follow up visit in 12  months but call sooner if needed

## 2021-09-15 NOTE — Assessment & Plan Note (Signed)
0nset around 2005 / never smoker> allergy eval by [facility] neg x [facility]es  - 07/11/2014 p extensive coaching HFA effectiveness =    90% rec try dulera 100 2bid - 07/26/14 cough resolved off acei > retry qvar 80 bid  - 07/31/14 > cough flared so rec dulera 100 2bid  - Sinus CT 08/10/2014 > Minor mucosal thickening in the BILATERAL maxillary sinuses without air-fluid level. - 03/17/2021 changed qvar 80 to dulera 200 2bid due to increase in freq of flares - 09/15/2021  After extensive coaching inhaler device,  effectiveness =    90% so rec symb 160 up to 2bid prn flare   Since his flares tend to occur with URIs and does not have significant environmental allergies, this is more of a Th-1 response and probably does not need high doses of ICS to control x during flares so rec symbicort  160 "prn"  Based on two studies from [place]; 20 p 1865 (2018) and 380 : p2020-30 (2019) in pts with mild asthma it is reasonable to use  symbicort   "prn" flare in this setting but I emphasized this was only shown with symbicort and takes advantage of the rapid onset of action but is not the same as "rescue therapy" but can be stopped once the acute symptoms have resolved and the need for rescue has been minimized (< 2 x weekly)    F/u can be yearly, sooner prn          Each maintenance medication was reviewed in detail including emphasizing most importantly the difference between maintenance and prns and under what circumstances the prns are to be triggered using an action plan format where appropriate.  Total time for H and P, chart review, counseling, reviewing hfa device(s) and generating customized AVS unique to this office visit / same day charting = 32 min

## 2021-09-15 NOTE — Progress Notes (Signed)
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 ventolin may helps some but does not eliminate it. prednisone helps some, just finished prednisone last month and much worse since stopped it. minimal actual sputum production. assoc with mild chronic nasal congestion. rec Prednisone x 12 days  Nexium 40 mg Take one 30-60 min before first meal of the day  Pepcid 20 mg at bedtime  Delsym 2 tsp every 12 hours as needed for cough  Stop symbicort   Jul 08, 2009 2 wk followup. Pt states that cough had improved while on prednisone. When he finished prednisone a few days ago cough started to come back. Cough was prod this am with white/yellow sputum. This has occurred despite compliance with diet and ppi. rec add qvar 80 2 bid   July 30, 2009 3 wk followup. Pt states that his cough has resolved.> rec  no change rx  12/12/10 ov/ Dian Minahan cc cough resolved completely on qvar and wants it refilled,   No doe >refill qvar , singulair d/c   07/02/2011 Acute OV  Complains of 3 weeks of initial URI that has progressed with increased prod cough with white/yellow mucus, increased SOB, wheezing, tightness in chest. Worse for last 5 days Was called in steroid pack that he finished this am. Prednisone helped only minimally.  Now coughing up thick yellow mucus.  No hemotpysis or chest pain . No edema.  Was under good control with cough until last 3 days.  Using delsym for cough.  Restarted singulair this week.  Restarted Nexium/Pepcid 2 weeks ago.  Has been  using mint cough drops- " forgot about avoiding mints"  rec Zpack take as directed.  Delsym 2 teaspoons twice daily as needed for cough. Avoid Mint products.  Hydromet 1-2 teaspoons every 4-6 hours as needed for cough, may make you sleepy. Use sugarless candy, water, ice chips to help avoid coughing   09/03/2011 f/u ov/Sebastien Jackson cc Patient states better since last visit. States cough is better.  rec Plan A = Qvar 80 Take 2 puffs first thing in am and then another 2 puffs about 12 hours later and blow it out through the nose. Plan B Only use your albuterol as a rescue medication to be used if you can't catch your breath by resting or doing a relaxed purse lip breathing pattern. The less you use it, the better it will work when you need it.  Only use your allegra as a "rescue medication" for your nasal symptoms Try Nexium 40 (prilosec 27m )   Take 30-60 min before first meal of the day and Pepcid 20 mg one bedtime until cough is completely gone for at least a week without the need for cough suppression   09/02/2012 f/u ov/Deangela Randleman re cough variant asthma/ good control on qvar/ here for yearly f/u/ refills Chief Complaint  Patient presents with   Follow-up    Pt states doing well and denies any co's today.   Refill qvar 80 / f/u  prn   07/11/2014 f/u ov/Geza Beranek re: recurrent cough  Chief Complaint  Patient presents with   Acute Visit    Pt c/o "cold" for the past 5 wks- now having increased SOB, acid reflux and cough. Cough is prod with white/blood tinged sputum.    qvar 80 2 puffs each am/  nexium /pepcid whenever cough flared which was very rare until around mid April 2016 while on ACEi felt like caught wife's cold with low grade fever and barking quality cough and rx w/in a week started nex/pepcid and took round of pred plus delsym seen by Dr Deborra Medina 06/25/14 rx zpak then May 16 augmentin > to [facility] 07/10/14 p coughing fit > rib injury on L but neg cxr rx tramadol but not taking aggressively. rec Stop  lisinopril avapro (ibesartan 150 mg) one daily in place of lisinopril 40  stop qvar and start dulera 100 Take 2 puffs first thing in am and then another 2 puffs about 12 hours later.  Take delsym two tsp every 12 hours and supplement if needed with  tramadol 50 mg up to 2 every 4 hours   Once you have eliminated the cough for 3 straight days try reducing the tramadol first,  then the delsym as tolerated.   Finish augmentin  Prednisone 10 mg take  4 each am x 2 days,   2 each am x 2 days,  1 each am x 2 days and stop  Increase nexium to 40 mg Take 30-60 min before first meal of the day      03/17/2021  Re establish ov/Mehdi Gironda re: cough variant asthma   maint on qvar  but having more freq flares  Chief Complaint  Patient presents with   Follow-up    Pt states he had bronchitis in Dec 2022- tx with pred and doxy.   Dyspnea:  working on wt loss, walking neighborhood ok unless hills  = MMRC1 = can walk nl pace, flat grade, can't hurry or go uphills or steps s sob   Cough: none  Sleeping:  Olalere w/u in progress SABA ZOX:WRUE now  02: none  Covid status:  vax x 4 no bivalent  Rec I recommend the bivalent covid vaccine  Plan A = Automatic = Always=    dulera 200 Take 2 puffs first thing in am and then another 2 puffs about 12 hours later if a having any resp symptoms at all.   Plan B = Backup (to supplement plan A, not to replace it) Only use your albuterol inhaler as a rescue medication   Please schedule a follow up visit in 6 months but call sooner if needed    09/15/2021  f/u ov/Vernel Donlan re: asthma flares with URI  maint on symb 160 2bid  and wants to know if can use prn  Chief Complaint  Patient presents with   Follow-up    Breathing is doing well on Symbicort. He rarely uses albuterol.   Dyspnea:  bike riding/ walking / pickle  Cough: none  Sleeping: cpap olalere x sev months/ flat bed with sev pillows does fine s resp cc  SABA use: none  02: none  Covid status:   all of them  No  obvious day to day or daytime variability or assoc excess/ purulent sputum or mucus plugs or hemoptysis or cp or chest tightness, subjective wheeze or overt sinus or hb symptoms.   Sleeping  without nocturnal  or early am exacerbation  of respiratory  c/o's or  need for noct saba. Also denies any obvious fluctuation of symptoms with weather or environmental changes or other aggravating or alleviating factors except as outlined above   No unusual exposure hx or h/o childhood pna/ asthma or knowledge of premature birth.  Current Allergies, Complete Past Medical History, Past Surgical History, Family History, and Social History were reviewed in [employer] record.  ROS  The following are not active complaints unless bolded Hoarseness, sore throat, dysphagia, dental problems, itching, sneezing,  nasal congestion or discharge of excess mucus or purulent secretions, ear ache,   fever, chills, sweats, unintended wt loss or wt gain, classically pleuritic or exertional cp,  orthopnea pnd or arm/hand swelling  or leg swelling, presyncope, palpitations, abdominal pain, anorexia, nausea, vomiting, diarrhea  or change in bowel habits or change in bladder habits, change in stools or change in urine, dysuria, hematuria,  rash, arthralgias, visual complaints, headache, numbness, weakness or ataxia or problems with walking or coordination,  change in mood or  memory.        Current Meds  Medication Sig   ACCU-CHEK GUIDE test strip TEST BLOOD SUGAR EVERY DAY   Accu-Chek Softclix Lancets lancets TEST BLOOD SUGAR ONCE DAILY   acetaminophen (TYLENOL) 500 MG tablet Take 1,000 mg by mouth every 6 (six) hours as needed (pain).   albuterol (VENTOLIN HFA) 108 (90 Base) MCG/ACT inhaler Inhale 2 puffs into the lungs every 4 (four) hours as needed for wheezing or shortness of breath.   allopurinol (ZYLOPRIM) 300 MG tablet TAKE 1 TABLET BY MOUTH EVERY DAY   amLODipine (NORVASC) 5 MG tablet Take 5 mg by  mouth daily.   aspirin 81 MG tablet Take 81 mg by mouth daily.   atorvastatin (LIPITOR) 40 MG tablet Take 1 tablet (40 mg total) by mouth daily.   Blood Glucose Monitoring Suppl (ACCU-CHEK GUIDE ME) w/Device KIT daily.   budesonide-formoterol (SYMBICORT) 160-4.5 MCG/ACT inhaler Inhale 2 puffs into the lungs in the morning and at bedtime.   carvedilol (COREG) 6.25 MG tablet Take 1 tablet (6.25 mg total) by mouth 2 (two) times daily with a meal.   cetirizine (ZYRTEC) 10 MG tablet Take 1 tablet (10 mg total) by mouth daily.   Cholecalciferol (VITAMIN D3 PO) Take 1 tablet by mouth daily.   cyclobenzaprine (FLEXERIL) 10 MG tablet Take 1 tablet (10 mg total) by mouth 3 (three) times daily as needed for muscle spasms.   dapagliflozin propanediol (FARXIGA) 10 MG TABS tablet Take 1 tablet (10 mg total) by mouth daily before breakfast.   famotidine (PEPCID) 20 MG tablet Take 20 mg by mouth at bedtime.   fexofenadine (ALLEGRA) 180 MG tablet Take 180 mg by mouth daily.    FLUoxetine (PROZAC) 20 MG capsule TAKE 1 CAPSULE BY MOUTH EVERY DAY   furosemide (LASIX) 20 MG tablet TAKE 1 TABLET BY MOUTH EVERY DAY AS NEEDED   irbesartan (AVAPRO) 300 MG tablet TAKE 1 TABLET BY MOUTH EVERY DAY   ketoconazole (NIZORAL) 2 % cream Apply 1 Application topically daily.   Probiotic Product (PROBIOTIC PO) Take 1 tablet by mouth daily.   Semaglutide, 2 MG/DOSE, 8 MG/3ML SOPN Inject 2 mg as directed once a week.   sodium chloride (OCEAN) 0.65 % SOLN nasal spray Place 1 spray into both nostrils as needed for congestion.   triamcinolone cream (KENALOG) 0.5 % Apply 1 Application topically 2 (two) times daily.                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 Dr Caprice Red  - Maybe trees > did not rec shots per pt  Chronic cough  - onset 2005 > resolved on ICS September 10, 2009  - Allergy profile sent Jul 08, 2009 >> neg  - Sinus Ct ordered Jul 08, 2009 >> neg  - Qvar trial Jul 08, 2009 >> much better July 30, 2009  with 90% effective hfa - flared on ACEi April 2016 > resolved at f/u off acei 07/25/14          Objective:   Physical Exam   wts   09/15/2021       260   03/17/2021       306  07/11/2014       289   > 07/25/2014 288 > 07/30/2014 286  Wt Readings from Last 3 Encounters:  09/02/12 298 lb (135.172 kg)  03/14/12 284 lb (128.822 kg)  02/03/12 281 lb (127.461 kg)    Vital signs reviewed  09/15/2021  - Note at rest 02 sats  98% on RA   General appearance:    amb wm nad   HEENT : Oropharynx clear     Nasal turbinates nl    NECK :  without  apparent JVD/ palpable Nodes/TM    LUNGS: no acc muscle use,  Nl contour chest which is clear to A and P bilaterally without cough on insp or exp maneuvers   CV:  RRR  no s3 or murmur or increase in P2, and no edema   ABD:  soft and nontender with nl inspiratory excursion in the supine position. No bruits or organomegaly appreciated   MS:  Nl gait/ ext warm without deformities Or obvious joint restrictions  calf tenderness, cyanosis or clubbing    SKIN: warm and dry without lesions    NEURO:  alert, approp, nl sensorium with  no motor or cerebellar deficits apparent.             Assessment:

## 2021-09-23 NOTE — Assessment & Plan Note (Signed)
Good control on coreg 6.25 mg BID,  Irbesartan 300 mg daily, but decreased amlodipine to 5 mg daily.

## 2021-09-23 NOTE — Assessment & Plan Note (Signed)
Chronic, significant weight loss with semaglutide.  Tolerating well.  Given plateau of weight loss in the last month we will increase  semaglutide to 2 mg weekly. Encouraged exercise, weight loss, healthy eating habits. Follow-up in 1 month.

## 2021-09-23 NOTE — Assessment & Plan Note (Signed)
Chronic, well controlled fasting blood sugars and 2-hour postprandials at goal.  Increase semaglutide to 2 mg weekly.  Remain off glipizide and continue low-dose Farxiga 5 mg p.o. daily.

## 2021-09-24 ENCOUNTER — Encounter: Payer: Self-pay | Admitting: Family Medicine

## 2021-09-25 MED ORDER — IRBESARTAN 150 MG PO TABS: 150.0000 mg | ORAL_TABLET | Freq: Every day | ORAL | 0 refills | Status: DC

## 2021-10-16 ENCOUNTER — Other Ambulatory Visit: Payer: Self-pay | Admitting: Family Medicine

## 2021-10-21 ENCOUNTER — Other Ambulatory Visit: Payer: Self-pay | Admitting: Family Medicine

## 2021-10-21 NOTE — Telephone Encounter (Signed)
Last office visit 08/19/21 for weight management and DM.  Last refilled 09/25/21 for #30 with no refills.  Dose was recently decreased to 150 mg   Ok to refill?

## 2021-11-21 ENCOUNTER — Ambulatory Visit: Payer: 59 | Admitting: Family Medicine

## 2021-11-21 ENCOUNTER — Encounter: Payer: Self-pay | Admitting: Family Medicine

## 2021-11-21 VITALS — BP 108/60 | HR 85 | Temp 97.7°F | Ht 71.0 in | Wt 254.4 lb

## 2021-11-21 DIAGNOSIS — Z23 Encounter for immunization: Secondary | ICD-10-CM | POA: Diagnosis not present

## 2021-11-21 DIAGNOSIS — E1159 Type 2 diabetes mellitus with other circulatory complications: Secondary | ICD-10-CM | POA: Diagnosis not present

## 2021-11-21 DIAGNOSIS — E119 Type 2 diabetes mellitus without complications: Secondary | ICD-10-CM | POA: Diagnosis not present

## 2021-11-21 DIAGNOSIS — Z6835 Body mass index (BMI) 35.0-35.9, adult: Secondary | ICD-10-CM | POA: Diagnosis not present

## 2021-11-21 DIAGNOSIS — I152 Hypertension secondary to endocrine disorders: Secondary | ICD-10-CM

## 2021-11-21 LAB — POCT GLYCOSYLATED HEMOGLOBIN (HGB A1C): Hemoglobin A1C: 5.7 % — AB (ref 4.0–5.6)

## 2021-11-21 NOTE — Assessment & Plan Note (Signed)
Chronic, excellent improvement with 2 mg semaglutide weekly.  No side effects He has lost a total of 73 pounds since initiation of semaglutide .

## 2021-11-21 NOTE — Assessment & Plan Note (Signed)
Chronic, well controlled despite reduction of medication  Coreg 6.25 mg p.o. twice daily Irbesartan 500 mg p.o. daily Amlodipine 5 mg daily

## 2021-11-21 NOTE — Progress Notes (Signed)
Patient ID: Richard Giles, male    DOB: 1960-10-18, 61 y.o.   MRN: [id number]  This visit was conducted in person.  BP 108/60   Pulse 85   Temp 97.7 F (36.5 C) (Oral)   Ht 5' 11"  (1.803 m)   Wt 254 lb 6 oz (115.4 kg)   SpO2 95%   BMI 35.48 kg/m    CC:  Chief Complaint  Patient presents with   Weight Management Screening    Subjective:   HPI: Richard Giles is a 61 y.o. male presenting on 11/21/2021 for Weight Management Screening  Morbid obesity with co-morbidities including DM, HTN and sleep apnea Starting weight 327, BMI 45.9  8 lbs lost in last 3 months Wt Readings from Last 3 Encounters:  11/21/21 254 lb 6 oz (115.4 kg)  09/15/21 260 lb (117.9 kg)  08/19/21 262 lb 4 oz (119 kg)   Body mass index is 35.48 kg/m.  DM  A1C  5.7 FBS 70-120, after meals < 180... no < 60 On semaglutide 2 mg weekly ( increased dose at last OV in 07/2021) Off glipizide and continued Farxiga 5 mg daily.    HTN  Good control on coreg 6.25 mg BID,  Irbesartan 150 mg daily, but decreased amlodipine to 5 mg daily.   He tried holding amlodipine x 1 week... but got nervous BP would increase so restarted.  Dizziness has resolved.   BP Readings from Last 3 Encounters:  11/21/21 108/60  09/15/21 112/70  08/19/21 130/64    He has been walking daily, 5-6 miles per day.  Relevant past medical, surgical, family and social history reviewed and updated as indicated. Interim medical history since our last visit reviewed. Allergies and medications reviewed and updated. Outpatient Medications Prior to Visit  Medication Sig Dispense Refill   ACCU-CHEK GUIDE test strip TEST BLOOD SUGAR EVERY DAY 100 strip 3   Accu-Chek Softclix Lancets lancets TEST BLOOD SUGAR ONCE DAILY     acetaminophen (TYLENOL) 500 MG tablet Take 1,000 mg by mouth every 6 (six) hours as needed (pain).     albuterol (VENTOLIN HFA) 108 (90 Base) MCG/ACT inhaler Inhale 2 puffs into the lungs every 4 (four) hours as needed for  wheezing or shortness of breath. 1 each 3   allopurinol (ZYLOPRIM) 300 MG tablet TAKE 1 TABLET BY MOUTH EVERY DAY 90 tablet 3   aspirin 81 MG tablet Take 81 mg by mouth daily.     atorvastatin (LIPITOR) 40 MG tablet Take 1 tablet (40 mg total) by mouth daily. 90 tablet 3   Blood Glucose Monitoring Suppl (ACCU-CHEK GUIDE ME) w/Device KIT daily.     budesonide-formoterol (SYMBICORT) 160-4.5 MCG/ACT inhaler Inhale 2 puffs into the lungs in the morning and at bedtime. 10.2 g 6   cetirizine (ZYRTEC) 10 MG tablet Take 1 tablet (10 mg total) by mouth daily. 14 tablet 0   Cholecalciferol (VITAMIN D3 PO) Take 1 tablet by mouth daily.     cyclobenzaprine (FLEXERIL) 10 MG tablet Take 1 tablet (10 mg total) by mouth 3 (three) times daily as needed for muscle spasms. 20 tablet 0   dapagliflozin propanediol (FARXIGA) 10 MG TABS tablet Take 1 tablet (10 mg total) by mouth daily before breakfast. 30 tablet 5   famotidine (PEPCID) 20 MG tablet Take 20 mg by mouth at bedtime.     fexofenadine (ALLEGRA) 180 MG tablet Take 180 mg by mouth daily.      FLUoxetine (PROZAC) 20 MG capsule  TAKE 1 CAPSULE BY MOUTH EVERY DAY 90 capsule 1   furosemide (LASIX) 20 MG tablet TAKE 1 TABLET BY MOUTH EVERY DAY AS NEEDED 90 tablet 1   irbesartan (AVAPRO) 150 MG tablet TAKE 1 TABLET BY MOUTH EVERY DAY 30 tablet 11   ketoconazole (NIZORAL) 2 % cream Apply 1 Application topically daily. 15 g 0   Probiotic Product (PROBIOTIC PO) Take 1 tablet by mouth daily.     Semaglutide, 2 MG/DOSE, 8 MG/3ML SOPN Inject 2 mg as directed once a week. 9 mL 3   sodium chloride (OCEAN) 0.65 % SOLN nasal spray Place 1 spray into both nostrils as needed for congestion. 15 mL 0   triamcinolone cream (KENALOG) 0.5 % Apply 1 Application topically 2 (two) times daily. 30 g 0   No facility-administered medications prior to visit.     Per HPI unless specifically indicated in ROS section below Review of Systems  Constitutional:  Negative for fatigue and  fever.  HENT:  Negative for ear pain.   Eyes:  Negative for pain.  Respiratory:  Negative for cough and shortness of breath.   Cardiovascular:  Negative for chest pain, palpitations and leg swelling.  Gastrointestinal:  Negative for abdominal pain.  Genitourinary:  Negative for dysuria.  Musculoskeletal:  Negative for arthralgias.  Neurological:  Negative for syncope, light-headedness and headaches.  Psychiatric/Behavioral:  Negative for dysphoric mood.    Objective:  BP 108/60   Pulse 85   Temp 97.7 F (36.5 C) (Oral)   Ht 5' 11"  (1.803 m)   Wt 254 lb 6 oz (115.4 kg)   SpO2 95%   BMI 35.48 kg/m   Wt Readings from Last 3 Encounters:  11/21/21 254 lb 6 oz (115.4 kg)  09/15/21 260 lb (117.9 kg)  08/19/21 262 lb 4 oz (119 kg)      Physical Exam Constitutional:      Appearance: He is well-developed. He is obese.  HENT:     Head: Normocephalic.     Right Ear: Hearing normal.     Left Ear: Hearing normal.     Nose: Nose normal.  Neck:     Thyroid: No thyroid mass or thyromegaly.     Vascular: No carotid bruit.     Trachea: Trachea normal.  Cardiovascular:     Rate and Rhythm: Normal rate and regular rhythm.     Pulses: Normal pulses.     Heart sounds: Heart sounds not distant. No murmur heard.    No friction rub. No gallop.     Comments: No peripheral edema Pulmonary:     Effort: Pulmonary effort is normal. No respiratory distress.     Breath sounds: Normal breath sounds.  Skin:    General: Skin is warm and dry.     Findings: No rash.  Psychiatric:        Speech: Speech normal.        Behavior: Behavior normal.        Thought Content: Thought content normal.       Results for orders placed or performed in visit on 06/13/21  HM DIABETES FOOT EXAM  Result Value Ref Range   HM Diabetic Foot Exam done      COVID 19 screen:  No recent travel or known exposure to [place] The patient denies respiratory symptoms of COVID 19 at this time. The importance of social  distancing was discussed today.   Assessment and Plan    Problem List Items Addressed This Visit  Class 2 severe obesity due to excess calories with serious comorbidity and body mass index (BMI) of 35.0 to 35.9 in adult [facility])    Chronic, excellent improvement with 2 mg semaglutide weekly.  No side effects He has lost a total of 73 pounds since initiation of semaglutide .       Hypertension associated with diabetes ([place])    Chronic, well controlled despite reduction of medication  Coreg 6.25 mg p.o. twice daily Irbesartan 500 mg p.o. 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      Other Visit Diagnoses     Need for influenza vaccination            Eliezer Lofts, MD

## 2021-11-21 NOTE — Assessment & Plan Note (Signed)
Chronic, significant improvement in control with weight loss on semaglutide 2 mg weekly He continues on Farxiga 5 mg daily with additional kidney protective benefit.

## 2021-11-29 ENCOUNTER — Other Ambulatory Visit: Payer: Self-pay | Admitting: Internal Medicine

## 2021-12-12 ENCOUNTER — Encounter: Payer: Self-pay | Admitting: Family Medicine

## 2021-12-12 MED ORDER — SEMAGLUTIDE (1 MG/DOSE) 4 MG/3ML ~~LOC~~ SOPN: 2.0000 mg | PEN_INJECTOR | SUBCUTANEOUS | 3 refills | Status: DC

## 2022-01-19 ENCOUNTER — Other Ambulatory Visit (HOSPITAL_COMMUNITY): Payer: Self-pay

## 2022-01-19 ENCOUNTER — Telehealth: Payer: Self-pay

## 2022-01-19 NOTE — Telephone Encounter (Signed)
Noted  

## 2022-01-19 NOTE — Telephone Encounter (Signed)
Received notification from [facility] regarding a prior authorization for Ozempic '2mg'$ /1.60m.   Authorization renewal has been APPROVED for 12 months through 01/20/2023.     Authorization #  [id number] # [phone]

## 2022-01-19 NOTE — Telephone Encounter (Signed)
Pharmacy Patient Advocate Encounter   Received notification from [facility] that prior authorization renewal for Ozempic '2mg'$ /1.63m is required/requested.   PA submitted on 01/19/22 to [employer] via CoverMyMeds  Key [id number]  Status is pending

## 2022-01-24 ENCOUNTER — Other Ambulatory Visit: Payer: Self-pay | Admitting: Family Medicine

## 2022-01-26 ENCOUNTER — Telehealth: Payer: Self-pay | Admitting: Family Medicine

## 2022-01-26 DIAGNOSIS — M109 Gout, unspecified: Secondary | ICD-10-CM

## 2022-01-26 DIAGNOSIS — E119 Type 2 diabetes mellitus without complications: Secondary | ICD-10-CM

## 2022-01-26 DIAGNOSIS — Z125 Encounter for screening for malignant neoplasm of prostate: Secondary | ICD-10-CM

## 2022-01-26 NOTE — Telephone Encounter (Signed)
-----   Message from Velna Hatchet, RT sent at 01/12/2022 12:22 PM EST ----- Regarding: Fri 12/8 lab Patient is scheduled for cpx, please order future labs.  Thanks, Anda Kraft

## 2022-01-30 ENCOUNTER — Other Ambulatory Visit (INDEPENDENT_AMBULATORY_CARE_PROVIDER_SITE_OTHER): Payer: 59

## 2022-01-30 DIAGNOSIS — M109 Gout, unspecified: Secondary | ICD-10-CM | POA: Diagnosis not present

## 2022-01-30 DIAGNOSIS — E119 Type 2 diabetes mellitus without complications: Secondary | ICD-10-CM

## 2022-01-30 DIAGNOSIS — Z125 Encounter for screening for malignant neoplasm of prostate: Secondary | ICD-10-CM

## 2022-01-30 LAB — COMPREHENSIVE METABOLIC PANEL
ALT: 14 U/L (ref 0–53)
AST: 15 U/L (ref 0–37)
Albumin: 4.1 g/dL (ref 3.5–5.2)
Alkaline Phosphatase: 114 U/L (ref 39–117)
BUN: 37 mg/dL — ABNORMAL HIGH (ref 6–23)
CO2: 28 mEq/L (ref 19–32)
Calcium: 9.6 mg/dL (ref 8.4–10.5)
Chloride: 104 mEq/L (ref 96–112)
Creatinine, Ser: 2.21 mg/dL — ABNORMAL HIGH (ref 0.40–1.50)
GFR: 31.46 mL/min — ABNORMAL LOW (ref >60.00)
Glucose, Bld: 105 mg/dL — ABNORMAL HIGH (ref 70–99)
Potassium: 5 mEq/L (ref 3.5–5.1)
Sodium: 139 mEq/L (ref 135–145)
Total Bilirubin: 0.6 mg/dL (ref 0.2–1.2)
Total Protein: 6.4 g/dL (ref 6.0–8.3)

## 2022-01-30 LAB — LIPID PANEL
Cholesterol: 117 mg/dL (ref 0–200)
HDL: 35.9 mg/dL — ABNORMAL LOW (ref >39.00)
LDL Cholesterol: 55 mg/dL (ref 0–99)
NonHDL: 81.06
Total CHOL/HDL Ratio: 3
Triglycerides: 129 mg/dL (ref 0.0–149.0)
VLDL: 25.8 mg/dL (ref 0.0–40.0)

## 2022-01-30 LAB — HEMOGLOBIN A1C: Hgb A1c MFr Bld: 6 % (ref 4.6–6.5)

## 2022-01-30 LAB — MICROALBUMIN / CREATININE URINE RATIO
Creatinine,U: 103 mg/dL
Microalb Creat Ratio: 2.4 mg/g (ref 0.0–30.0)
Microalb, Ur: 2.5 mg/dL — ABNORMAL HIGH (ref 0.0–1.9)

## 2022-01-30 LAB — PSA: PSA: 0.77 ng/mL (ref 0.10–4.00)

## 2022-01-30 LAB — URIC ACID: Uric Acid, Serum: 5.9 mg/dL (ref 4.0–7.8)

## 2022-01-30 NOTE — Progress Notes (Signed)
No critical labs need to be addressed urgently. We will discuss labs in detail at upcoming office visit.   

## 2022-02-10 ENCOUNTER — Encounter: Payer: Self-pay | Admitting: Family Medicine

## 2022-02-10 ENCOUNTER — Ambulatory Visit (INDEPENDENT_AMBULATORY_CARE_PROVIDER_SITE_OTHER): Payer: 59 | Admitting: Family Medicine

## 2022-02-10 VITALS — BP 118/70 | HR 83 | Temp 97.9°F | Ht 70.5 in | Wt 262.5 lb

## 2022-02-10 DIAGNOSIS — Z0000 Encounter for general adult medical examination without abnormal findings: Secondary | ICD-10-CM | POA: Diagnosis not present

## 2022-02-10 DIAGNOSIS — I152 Hypertension secondary to endocrine disorders: Secondary | ICD-10-CM

## 2022-02-10 DIAGNOSIS — E1159 Type 2 diabetes mellitus with other circulatory complications: Secondary | ICD-10-CM

## 2022-02-10 DIAGNOSIS — J42 Unspecified chronic bronchitis: Secondary | ICD-10-CM | POA: Diagnosis not present

## 2022-02-10 DIAGNOSIS — N1831 Chronic kidney disease, stage 3a: Secondary | ICD-10-CM

## 2022-02-10 DIAGNOSIS — E1169 Type 2 diabetes mellitus with other specified complication: Secondary | ICD-10-CM | POA: Diagnosis not present

## 2022-02-10 DIAGNOSIS — J4540 Moderate persistent asthma, uncomplicated: Secondary | ICD-10-CM

## 2022-02-10 DIAGNOSIS — Z6835 Body mass index (BMI) 35.0-35.9, adult: Secondary | ICD-10-CM

## 2022-02-10 DIAGNOSIS — E785 Hyperlipidemia, unspecified: Secondary | ICD-10-CM

## 2022-02-10 NOTE — Assessment & Plan Note (Signed)
Stable, chronic.  Continue current medication.  Coreg 6.25 mg p.o. twice daily Irbesartan 500 mg p.o. daily Amlodipine 5 mg daily

## 2022-02-10 NOTE — Assessment & Plan Note (Signed)
Chronic, well-controlled atorvastatin 40 mg daily

## 2022-02-10 NOTE — Patient Instructions (Addendum)
Set up yearly eye exam for diabetes and have the opthalmologist send [place] a copy of the evaluation for the chart. Get COVID vaccine and consider RSV vaccine.

## 2022-02-10 NOTE — Assessment & Plan Note (Signed)
Stable, chronic.  Continue current medication.  Symbicort controller daily Albuterol as needed.

## 2022-02-10 NOTE — Progress Notes (Signed)
Patient ID: Richard Giles, male    DOB: 15-May-1960, 61 y.o.   MRN: [id number]  This visit was conducted in person.  Pulse 83   Temp 97.9 F (36.6 C) (Oral)   Ht 5' 10.5" (1.791 m)   Wt 262 lb 8 oz (119.1 kg)   SpO2 96%   BMI 37.13 kg/m    CC:  Chief Complaint  Patient presents with   Annual Exam    Subjective:   HPI: Richard Giles is a 61 y.o. male presenting on 02/10/2022 for Annual Exam  The patient presents for complete physical and review of chronic health problems. 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  Small amount of weight gain back following reduction of semaglutide dose given availability.  He has been off for about 1 month.. now back on semaglutide 2 mg weekly from [facility]. He had issues with appetite returning.  Wt Readings from Last 3 Encounters:  02/10/22 262 lb 8 oz (119.1 kg)  11/21/21 254 lb 6 oz (115.4 kg)  09/15/21 260 lb (117.9 kg)  Body mass index is 37.13 kg/m.   Hypertension:  Well-controlled Coreg 6.25 mg p.o. twice daily Irbesartan 500 mg p.o. daily Amlodipine 5 mg daily BP Readings from Last 3 Encounters:  02/10/22 118/70  11/21/21 108/60  09/15/21 112/70  Using medication without problems or lightheadedness:  none Chest pain with exertion: none Edema: none Short of breath:none Average home BPs: Other issues:    No recent gout flares.. on allopurinol 300 mg daily.  Elevated Cholesterol: Well-controlled on atorvastatin 40 mg daily Lab Results  Component Value Date   CHOL 117 01/30/2022   HDL 35.90 (L) 01/30/2022   LDLCALC 55 01/30/2022   LDLDIRECT 85.0 08/12/2020   TRIG 129.0 01/30/2022   CHOLHDL 3 01/30/2022  Using medications without problems: none Muscle aches: none Diet compliance: low carb Exercise:  working on biking and trying to move bike inside in cold weather. Other complaints:  Diabetes: Well-controlled on semaglutide following weight  loss Lab Results  Component Value Date   HGBA1C 6.0 01/30/2022  Using medications without difficulties: Hypoglycemic episodes: Hyperglycemic episodes: Feet problems: no ulcer Blood Sugars averaging: eye exam within last year: due      Relevant past medical, surgical, family and social history reviewed and updated as indicated. Interim medical history since our last visit reviewed. Allergies and medications reviewed and updated. Outpatient Medications Prior to Visit  Medication Sig Dispense Refill   ACCU-CHEK GUIDE test strip TEST BLOOD SUGAR EVERY DAY 100 strip 3   Accu-Chek Softclix Lancets lancets TEST BLOOD SUGAR ONCE DAILY     acetaminophen (TYLENOL) 500 MG tablet Take 1,000 mg by mouth every 6 (six) hours as needed (pain).     albuterol (VENTOLIN HFA) 108 (90 Base) MCG/ACT inhaler Inhale 2 puffs into the lungs every 4 (four) hours as needed for wheezing or shortness of breath. 1 each 3   allopurinol (ZYLOPRIM) 300 MG tablet TAKE 1 TABLET BY MOUTH EVERY DAY 90 tablet 3   aspirin 81 MG tablet Take 81 mg by mouth daily.     atorvastatin (LIPITOR) 40 MG tablet TAKE 1 TABLET BY MOUTH EVERY DAY 90 tablet 1   Blood Glucose Monitoring Suppl (ACCU-CHEK GUIDE ME) w/Device KIT daily.     budesonide-formoterol (SYMBICORT) 160-4.5 MCG/ACT inhaler INHALE 2 PUFFS INTO THE LUNGS IN THE MORNING AND AT BEDTIME. 10.2 each 11   cetirizine (ZYRTEC) 10  MG tablet Take 1 tablet (10 mg total) by mouth daily. 14 tablet 0   Cholecalciferol (VITAMIN D3 PO) Take 1 tablet by mouth daily.     cyclobenzaprine (FLEXERIL) 10 MG tablet Take 1 tablet (10 mg total) by mouth 3 (three) times daily as needed for muscle spasms. 20 tablet 0   dapagliflozin propanediol (FARXIGA) 10 MG TABS tablet Take 1 tablet (10 mg total) by mouth daily before breakfast. 30 tablet 5   famotidine (PEPCID) 20 MG tablet Take 20 mg by mouth at bedtime.     fexofenadine (ALLEGRA) 180 MG tablet Take 180 mg by mouth daily.      FLUoxetine  (PROZAC) 20 MG capsule TAKE 1 CAPSULE BY MOUTH EVERY DAY 90 capsule 1   furosemide (LASIX) 20 MG tablet TAKE 1 TABLET BY MOUTH EVERY DAY AS NEEDED 90 tablet 1   irbesartan (AVAPRO) 150 MG tablet TAKE 1 TABLET BY MOUTH EVERY DAY 30 tablet 11   ketoconazole (NIZORAL) 2 % cream Apply 1 Application topically daily. 15 g 0   Probiotic Product (PROBIOTIC PO) Take 1 tablet by mouth daily.     Semaglutide, 1 MG/DOSE, 4 MG/3ML SOPN Inject 2 mg as directed once a week. 6 mL 3   sodium chloride (OCEAN) 0.65 % SOLN nasal spray Place 1 spray into both nostrils as needed for congestion. 15 mL 0   triamcinolone cream (KENALOG) 0.5 % Apply 1 Application topically 2 (two) times daily. 30 g 0   No facility-administered medications prior to visit.     Per HPI unless specifically indicated in ROS section below Review of Systems  Constitutional:  Negative for fatigue and fever.  HENT:  Negative for ear pain.   Eyes:  Negative for pain.  Respiratory:  Negative for cough and shortness of breath.   Cardiovascular:  Negative for chest pain, palpitations and leg swelling.  Gastrointestinal:  Negative for abdominal pain.  Genitourinary:  Negative for dysuria.  Musculoskeletal:  Negative for arthralgias.  Neurological:  Negative for syncope, light-headedness and headaches.  Psychiatric/Behavioral:  Negative for dysphoric mood.    Objective:  Pulse 83   Temp 97.9 F (36.6 C) (Oral)   Ht 5' 10.5" (1.791 m)   Wt 262 lb 8 oz (119.1 kg)   SpO2 96%   BMI 37.13 kg/m   Wt Readings from Last 3 Encounters:  02/10/22 262 lb 8 oz (119.1 kg)  11/21/21 254 lb 6 oz (115.4 kg)  09/15/21 260 lb (117.9 kg)      Physical Exam Constitutional:      General: He is not in acute distress.    Appearance: Normal appearance. He is well-developed. He is not ill-appearing or toxic-appearing.  HENT:     Head: Normocephalic and atraumatic.     Right Ear: Hearing, tympanic membrane, ear canal and external ear normal.     Left  Ear: Hearing, tympanic membrane, ear canal and external ear normal.     Nose: Nose normal.     Mouth/Throat:     Pharynx: Uvula midline.  Eyes:     General: Lids are normal. Lids are everted, no foreign bodies appreciated.     Conjunctiva/sclera: Conjunctivae normal.     Pupils: Pupils are equal, round, and reactive to light.  Neck:     Thyroid: No thyroid mass or thyromegaly.     Vascular: No carotid bruit.     Trachea: Trachea and phonation normal.  Cardiovascular:     Rate and Rhythm: Normal rate and regular  rhythm.     Pulses: Normal pulses.     Heart sounds: S1 normal and S2 normal. No murmur heard.    No gallop.  Pulmonary:     Breath sounds: Normal breath sounds. No wheezing, rhonchi or rales.  Abdominal:     General: Bowel sounds are normal.     Palpations: Abdomen is soft.     Tenderness: There is no abdominal tenderness. There is no guarding or rebound.     Hernia: No hernia is present.  Musculoskeletal:     Cervical back: Normal range of motion and neck supple.  Lymphadenopathy:     Cervical: No cervical adenopathy.  Skin:    General: Skin is warm and dry.     Findings: No rash.  Neurological:     Mental Status: He is alert.     Cranial Nerves: No cranial nerve deficit.     Sensory: No sensory deficit.     Gait: Gait normal.     Deep Tendon Reflexes: Reflexes are normal and symmetric.  Psychiatric:        Speech: Speech normal.        Behavior: Behavior normal.        Judgment: Judgment normal.       Results for orders placed or performed in visit on 01/30/22  PSA  Result Value Ref Range   PSA 0.77 0.10 - 4.00 ng/mL  Uric acid  Result Value Ref Range   Uric Acid, Serum 5.9 4.0 - 7.8 mg/dL  Microalbumin / creatinine urine ratio  Result Value Ref Range   Microalb, Ur 2.5 (H) 0.0 - 1.9 mg/dL   Creatinine,U 103.0 mg/dL   Microalb Creat Ratio 2.4 0.0 - 30.0 mg/g  Comprehensive metabolic panel  Result Value Ref Range   Sodium 139 135 - 145 mEq/L    Potassium 5.0 3.5 - 5.1 mEq/L   Chloride 104 96 - 112 mEq/L   CO2 28 19 - 32 mEq/L   Glucose, Bld 105 (H) 70 - 99 mg/dL   BUN 37 (H) 6 - 23 mg/dL   Creatinine, Ser 2.21 (H) 0.40 - 1.50 mg/dL   Total Bilirubin 0.6 0.2 - 1.2 mg/dL   Alkaline Phosphatase 114 39 - 117 U/L   AST 15 0 - 37 U/L   ALT 14 0 - 53 U/L   Total Protein 6.4 6.0 - 8.3 g/dL   Albumin 4.1 3.5 - 5.2 g/dL   GFR 31.46 (L) >60.00 mL/min   Calcium 9.6 8.4 - 10.5 mg/dL  Lipid panel  Result Value Ref Range   Cholesterol 117 0 - 200 mg/dL   Triglycerides 129.0 0.0 - 149.0 mg/dL   HDL 35.90 (L) >39.00 mg/dL   VLDL 25.8 0.0 - 40.0 mg/dL   LDL Cholesterol 55 0 - 99 mg/dL   Total CHOL/HDL Ratio 3    NonHDL 81.06   Hemoglobin A1c  Result Value Ref Range   Hgb A1c MFr Bld 6.0 4.6 - 6.5 %     COVID 19 screen:  No recent travel or known exposure to COVID19 The patient denies respiratory symptoms of COVID 19 at this time. The importance of social distancing was discussed today.   Assessment and Plan The patient's preventative maintenance and recommended screening tests for an annual wellness exam were reviewed in full today. Brought up to date unless services declined.  Counselled on the importance of diet, exercise, and its role in overall health and mortality. The patient's FH and SH was reviewed, including  their home life, tobacco status, and drug and alcohol status.   Due for yearly eye exam Vaccines: Up-to-date with Shingrix, flu and partially vaccinated for COVID. Prostate Cancer Screen:  Lab Results  Component Value Date   PSA 0.77 01/30/2022   PSA 0.40 08/12/2020   PSA 0.47 02/21/2018  Colon Cancer Screen: Colonoscopy October 11, 2018 repeat due in 5 years      Smoking Status: Former  chewing tobacco ETOH/ drug use: Limited/none  Hep C:  done  HIV screen:     Problem List Items Addressed This Visit     CKD (chronic kidney disease), stage III (HCC)     chronic, stable, followed by renal.      Class  2 severe obesity due to excess calories with serious comorbidity and body mass index (BMI) of 35.0 to 35.9 in adult [facility])    Encouraged exercise, weight loss, healthy eating habits. Current dose of semaglutide      RESOLVED: COPD (chronic obstructive pulmonary disease) (HCC)    Combination with asthma Stable on Qvar daily.  Using albuterol as needed.      Hyperlipidemia associated with type 2 diabetes mellitus (HCC)    Chronic, well-controlled atorvastatin 40 mg daily      Hypertension associated with diabetes ([place])    Stable, chronic.  Continue current medication.  Coreg 6.25 mg p.o. twice daily Irbesartan 500 mg p.o. daily Amlodipine 5 mg daily      Moderate persistent asthma    Stable, chronic.  Continue current medication.  Symbicort controller daily Albuterol as needed.      Type 2 diabetes mellitus with other circulatory complications ([place])   Other Visit Diagnoses     Routine general medical examination at a health care facility    -  Primary          Eliezer Lofts, MD

## 2022-02-10 NOTE — Assessment & Plan Note (Signed)
Encouraged exercise, weight loss, healthy eating habits. Current dose of semaglutide

## 2022-02-10 NOTE — Assessment & Plan Note (Signed)
chronic, stable, followed by renal.

## 2022-02-10 NOTE — Assessment & Plan Note (Signed)
Combination with asthma Stable on Qvar daily.  Using albuterol as needed.

## 2022-02-12 ENCOUNTER — Encounter: Payer: Self-pay | Admitting: Family Medicine

## 2022-02-12 MED ORDER — PREDNISONE 20 MG PO TABS: ORAL_TABLET | ORAL | 0 refills | Status: DC

## 2022-02-12 MED ORDER — EPINEPHRINE 0.3 MG/0.3ML IJ SOAJ: 0.3000 mg | INTRAMUSCULAR | 1 refills | Status: DC | PRN

## 2022-02-18 ENCOUNTER — Ambulatory Visit: Payer: 59 | Admitting: Primary Care

## 2022-02-18 IMAGING — US US SOFT TISSUE HEAD/NECK
1 series · 14 of 14 positions shown · non-contrast
Comparison: None.

CLINICAL DATA: Left neck swelling.

EXAM:
ULTRASOUND OF HEAD/NECK SOFT TISSUES
TECHNIQUE: Ultrasound examination of the head and neck soft tissues was
performed in the area of clinical concern.

[Series 1: us soft tissue head/neck · 0.05mm/px · 14 of 14 slices shown]
[im 1/14]
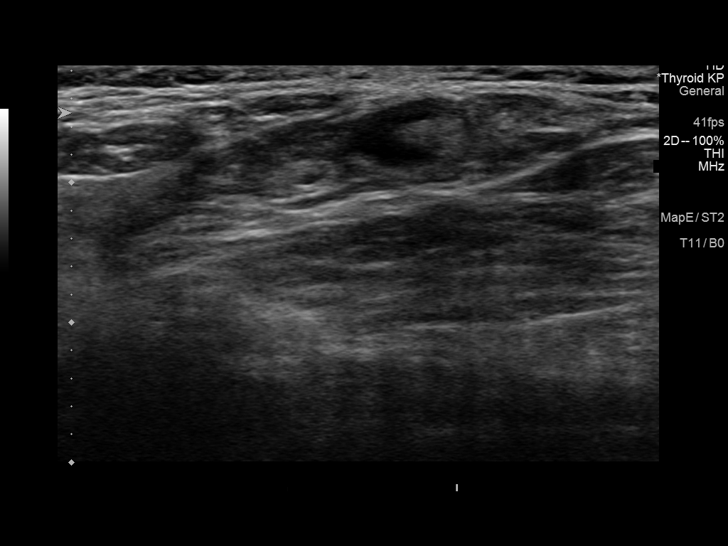
[im 2/14]
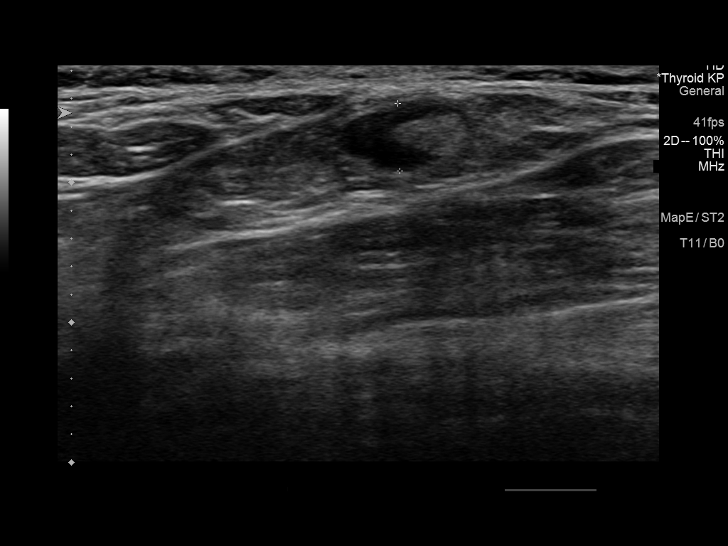
[im 3/14]
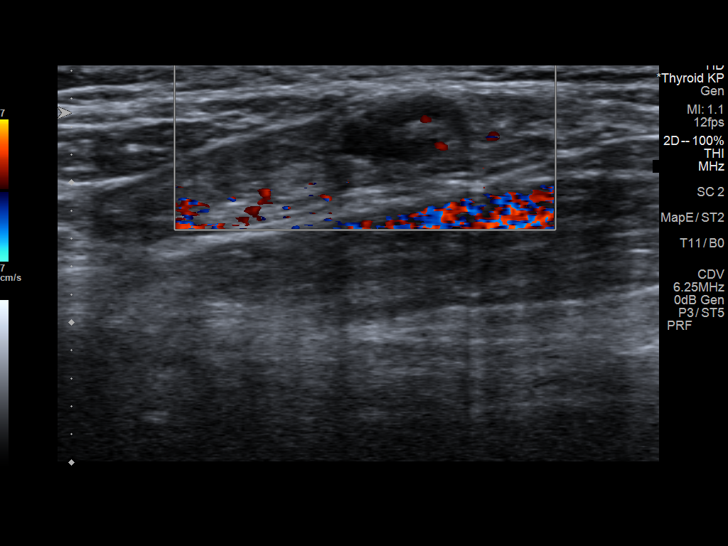
[im 4/14]
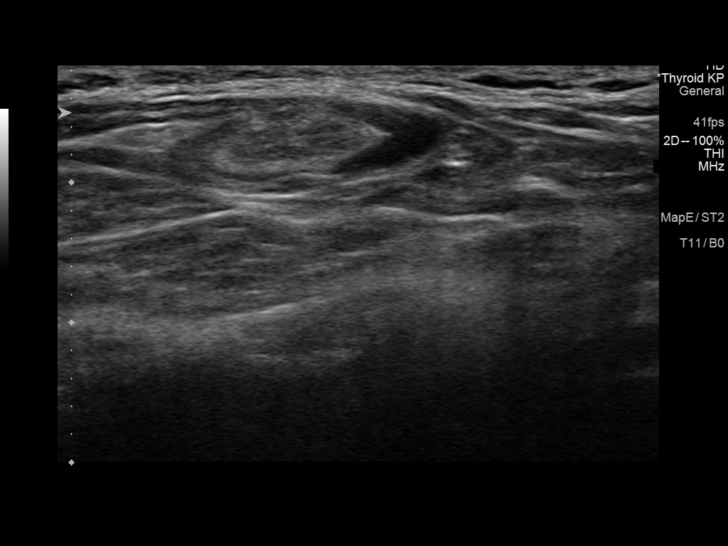
[im 5/14]
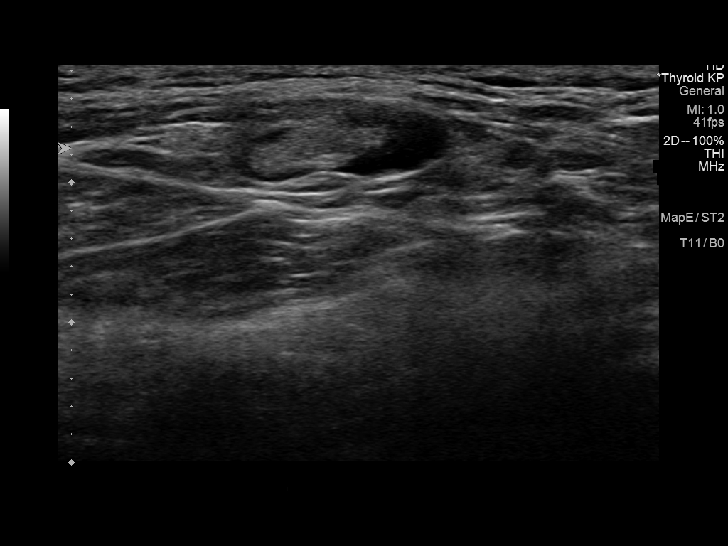
[im 6/14]
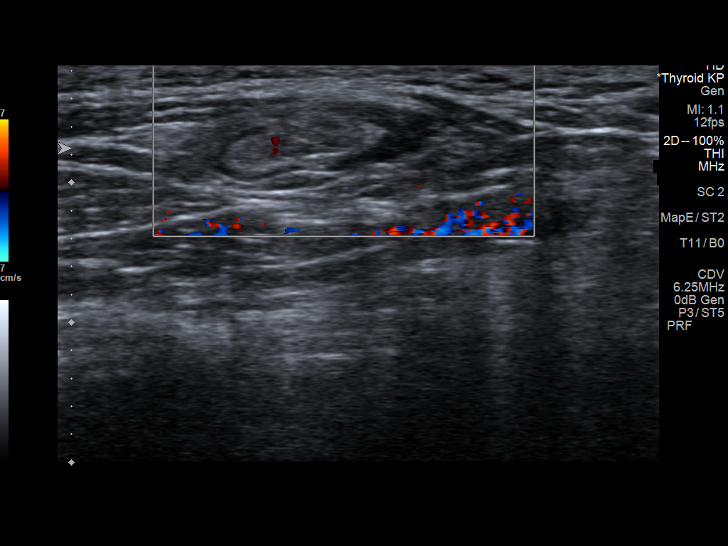
[im 7/14]
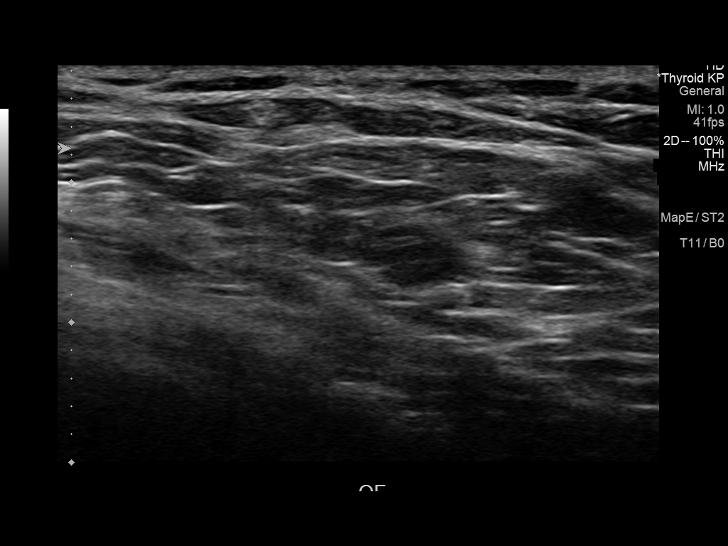
[im 8/14]
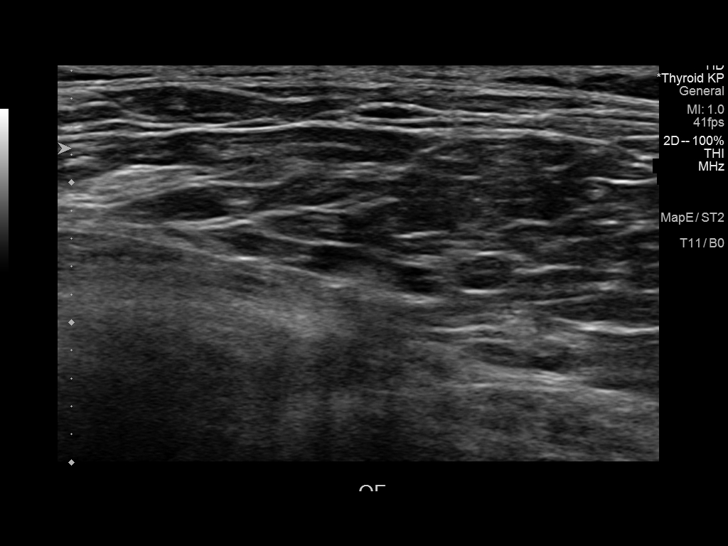
[im 9/14]
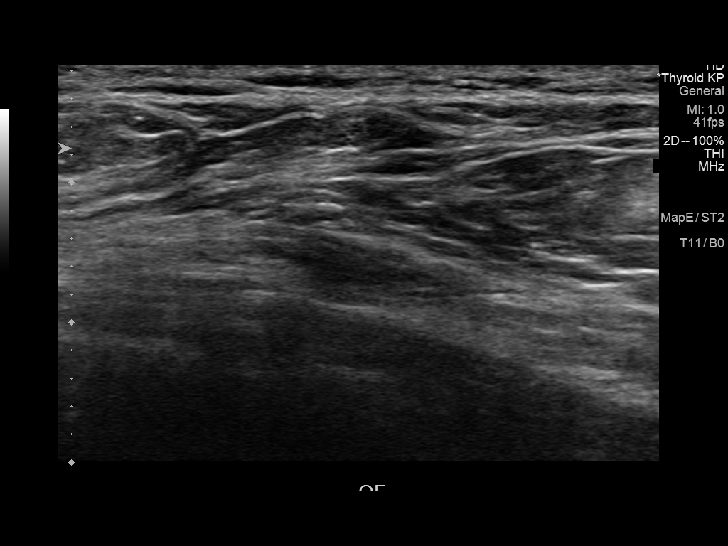
[im 10/14]
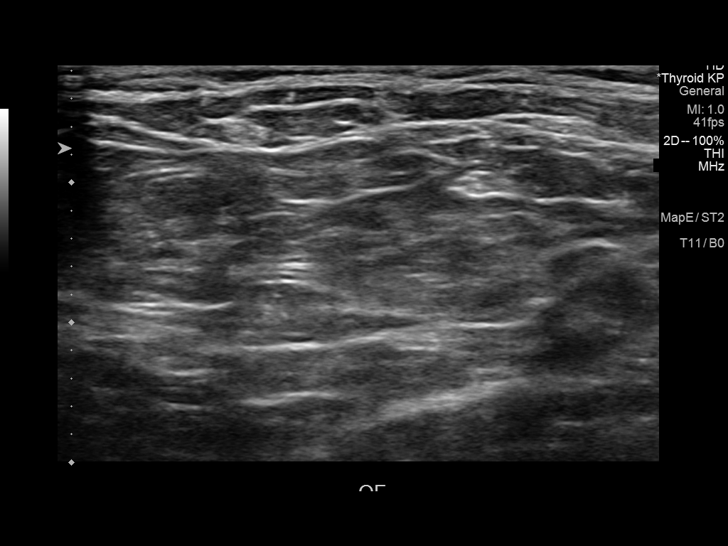
[im 11/14]
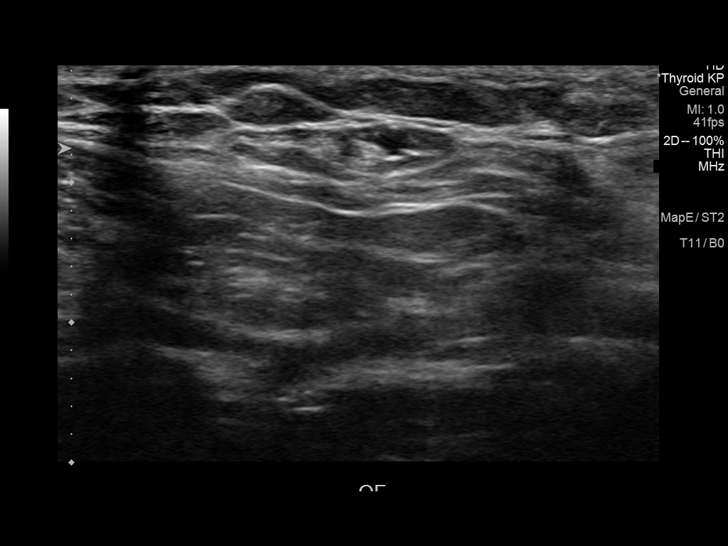
[im 12/14]
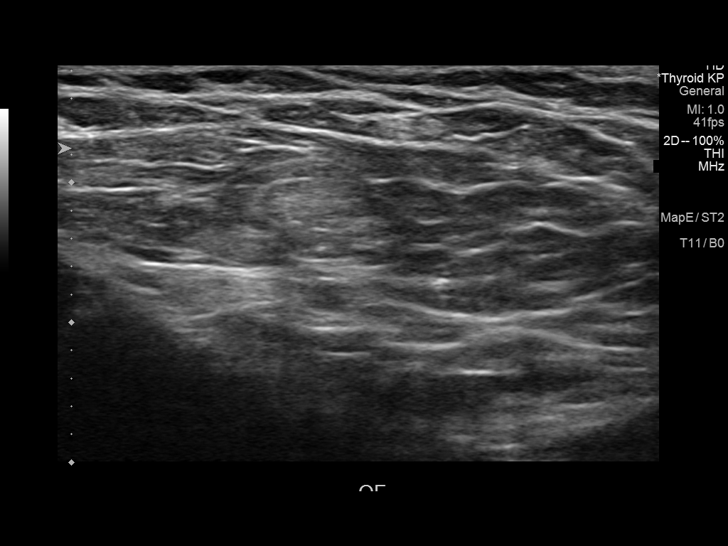
[im 13/14]
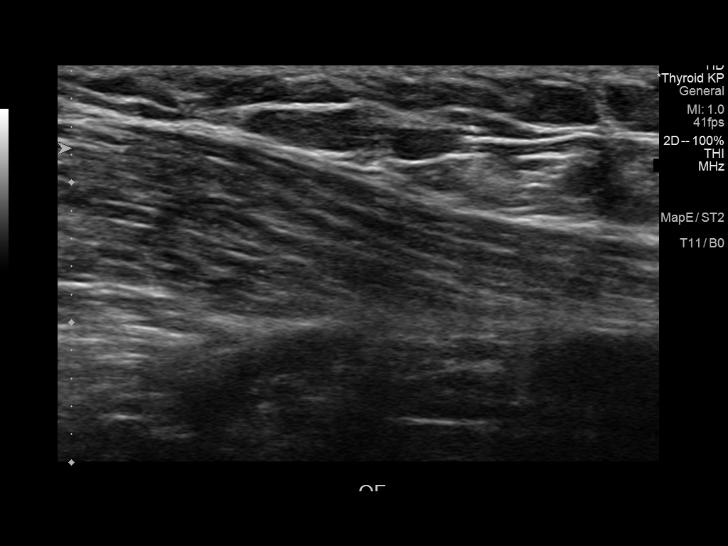
[im 14/14]
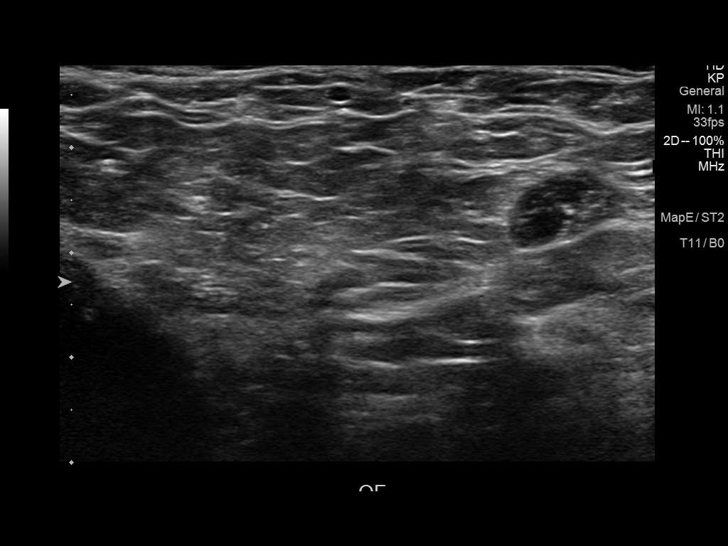

[14 of 14 positions shown; findings below may reference images not displayed]

FINDINGS: In the patient's palpable area of concern is a morphologically
normal, small lymph node measuring approximately 5 mm in the short
axis. Otherwise, there was no sonographic abnormality detected
including no mass or fluid collection.
IMPRESSION: Unremarkable exam.

## 2022-03-03 ENCOUNTER — Encounter: Payer: Self-pay | Admitting: Family Medicine

## 2022-03-04 MED ORDER — FUROSEMIDE 20 MG PO TABS: 20.0000 mg | ORAL_TABLET | Freq: Every day | ORAL | 1 refills | Status: DC | PRN

## 2022-03-17 ENCOUNTER — Encounter: Payer: Self-pay | Admitting: Family Medicine

## 2022-03-20 ENCOUNTER — Other Ambulatory Visit (HOSPITAL_COMMUNITY): Payer: Self-pay

## 2022-03-20 NOTE — Telephone Encounter (Signed)
Patient Advocate Encounter   Received notification from [employer] that prior authorization for Richard Giles is required.   PA submitted on 03/20/2022 Key [id number] Status is pending

## 2022-03-23 ENCOUNTER — Other Ambulatory Visit (HOSPITAL_COMMUNITY): Payer: Self-pay

## 2022-03-23 NOTE — Telephone Encounter (Signed)
Pharmacy Patient Advocate Encounter  Received notification from [employer] that the request for prior authorization for Wilder Glade has been denied due to .    Please be advised we currently do not have a Pharmacist to review denials, therefore you will need to process appeals accordingly as needed. Thanks for your support at this time.  You may call [phone] or fax [phone], to appeal.

## 2022-03-25 MED ORDER — EMPAGLIFLOZIN 25 MG PO TABS: 25.0000 mg | ORAL_TABLET | Freq: Every day | ORAL | 11 refills | Status: DC

## 2022-03-25 NOTE — Addendum Note (Signed)
Addended by: Eliezer Lofts E on: 03/25/2022 09:32 AM   Modules accepted: Orders

## 2022-04-10 LAB — HM DIABETES EYE EXAM

## 2022-04-26 ENCOUNTER — Encounter: Payer: Self-pay | Admitting: Family Medicine

## 2022-04-27 NOTE — Telephone Encounter (Signed)
Please triage

## 2022-04-27 NOTE — Telephone Encounter (Signed)
Spoke to patient by telephone and was advised that he had a cold last week. Patient stated that he had a runny nose and head congestion. Patient denies a fever or SOB. Patient stated when he has a flare-up with bronchitis Dr. Diona Browner has sent in Prednisone for him. Patient stated that he has a history of bronchitis after getting a cold. Patient was advised that someone needs to see him and  listen to his lungs. Patient was coughing a lot while on the phone. Patient stated that he has taken Amoxicillin for a week that belonged to his wife. Patient was offered an appointment today which he declined stating that he is at work. Patient stated that he has not done a covid test. Patient scheduled for an office visit tomorrow Tuesday 04/28/22 at 9:00 am with Dr. Diona Browner. Patient was given ER precautions and he verbalized understanding.

## 2022-04-28 ENCOUNTER — Encounter: Payer: Self-pay | Admitting: Family Medicine

## 2022-04-28 ENCOUNTER — Ambulatory Visit (INDEPENDENT_AMBULATORY_CARE_PROVIDER_SITE_OTHER): Payer: 59 | Admitting: Family Medicine

## 2022-04-28 VITALS — BP 106/60 | HR 93 | Temp 97.4°F | Ht 70.5 in | Wt 264.5 lb

## 2022-04-28 DIAGNOSIS — J4541 Moderate persistent asthma with (acute) exacerbation: Secondary | ICD-10-CM

## 2022-04-28 MED ORDER — PREDNISONE 20 MG PO TABS: ORAL_TABLET | ORAL | 0 refills | Status: DC

## 2022-04-28 MED ORDER — AZITHROMYCIN 250 MG PO TABS: ORAL_TABLET | ORAL | 0 refills | Status: DC

## 2022-04-28 NOTE — Progress Notes (Signed)
Patient ID: BON REASON, male    DOB: 01/04/1961, 62 y.o.   MRN: [id number]  This visit was conducted in person.  BP 106/60   Pulse 93   Temp (!) 97.4 F (36.3 C) (Temporal)   Ht 5' 10.5" (1.791 m)   Wt 264 lb 8 oz (120 kg)   SpO2 97%   BMI 37.42 kg/m    CC:  Chief Complaint  Patient presents with   Cough    Started Friday/Saturday-Had head cold 2 weeks ago Took Amoxicillin  875 mg Clarise Cruz had BID x 10 day     Subjective:   HPI: Richard Giles is a 62 y.o. male with history of hypertension, type 2 diabetes moderate persistent asthma presenting on 04/28/2022 for Cough (Started Friday/Saturday-Had head cold 2 weeks ago/Took Amoxicillin  875 mg Clarise Cruz had BID x 10 day )   Date of onset:  2 weeks ago Initial symptoms included  nasal congestion Symptoms progressed to cough, wheeze and SOB. Was doing better after Amox until 4 days ago.. worsened .Marland Kitchen Now chest congestion.   No fever, no Face pain, no ear pain,  Has ST from cough. Cough keeping him up  at night.     Sick contacts:  none COVID testing:   none     He has tried to treat with  Amox 875 mg BID x 10 days that his wife had.                                                 Using albuterol 2 puffs inhaled every 4 hours as needed for wheeze Allegra 180 mg daily Symbicort 2 puffs twice daily   Plans to return to see Dr. Melvyn Novas pulmonary as feels more episode in last year.     Has history of asthma or COPD. Non-smoker.   Wt Readings from Last 3 Encounters:  04/28/22 264 lb 8 oz (120 kg)  02/10/22 262 lb 8 oz (119.1 kg)  11/21/21 254 lb 6 oz (115.4 kg)         Relevant past medical, surgical, family and social history reviewed and updated as indicated. Interim medical history since our last visit reviewed. Allergies and medications reviewed and updated. Outpatient Medications Prior to Visit  Medication Sig Dispense Refill   ACCU-CHEK GUIDE test strip TEST BLOOD SUGAR EVERY DAY 100 strip 3   Accu-Chek Softclix  Lancets lancets TEST BLOOD SUGAR ONCE DAILY     acetaminophen (TYLENOL) 500 MG tablet Take 1,000 mg by mouth every 6 (six) hours as needed (pain).     albuterol (VENTOLIN HFA) 108 (90 Base) MCG/ACT inhaler Inhale 2 puffs into the lungs every 4 (four) hours as needed for wheezing or shortness of breath. 1 each 3   allopurinol (ZYLOPRIM) 300 MG tablet TAKE 1 TABLET BY MOUTH EVERY DAY 90 tablet 3   aspirin 81 MG tablet Take 81 mg by mouth daily.     atorvastatin (LIPITOR) 40 MG tablet TAKE 1 TABLET BY MOUTH EVERY DAY 90 tablet 1   Blood Glucose Monitoring Suppl (ACCU-CHEK GUIDE ME) w/Device KIT daily.     budesonide-formoterol (SYMBICORT) 160-4.5 MCG/ACT inhaler INHALE 2 PUFFS INTO THE LUNGS IN THE MORNING AND AT BEDTIME. 10.2 each 11   cetirizine (ZYRTEC) 10 MG tablet Take 1 tablet (10 mg total) by mouth daily. 14 tablet  0   Cholecalciferol (VITAMIN D3 PO) Take 1 tablet by mouth daily.     cyclobenzaprine (FLEXERIL) 10 MG tablet Take 1 tablet (10 mg total) by mouth 3 (three) times daily as needed for muscle spasms. 20 tablet 0   empagliflozin (JARDIANCE) 25 MG TABS tablet Take 1 tablet (25 mg total) by mouth daily before breakfast. 30 tablet 11   EPINEPHrine 0.3 mg/0.3 mL IJ SOAJ injection Inject 0.3 mg into the muscle as needed for anaphylaxis. 1 each 1   famotidine (PEPCID) 20 MG tablet Take 20 mg by mouth at bedtime.     fexofenadine (ALLEGRA) 180 MG tablet Take 180 mg by mouth daily.      FLUoxetine (PROZAC) 20 MG capsule TAKE 1 CAPSULE BY MOUTH EVERY DAY 90 capsule 1   furosemide (LASIX) 20 MG tablet Take 1 tablet (20 mg total) by mouth daily as needed. 90 tablet 1   irbesartan (AVAPRO) 150 MG tablet TAKE 1 TABLET BY MOUTH EVERY DAY 30 tablet 11   ketoconazole (NIZORAL) 2 % cream Apply 1 Application topically daily. 15 g 0   Probiotic Product (PROBIOTIC PO) Take 1 tablet by mouth daily.     Semaglutide, 1 MG/DOSE, 4 MG/3ML SOPN Inject 2 mg as directed once a week. 6 mL 3   sodium chloride  (OCEAN) 0.65 % SOLN nasal spray Place 1 spray into both nostrils as needed for congestion. 15 mL 0   triamcinolone cream (KENALOG) 0.5 % Apply 1 Application topically 2 (two) times daily. 30 g 0   predniSONE (DELTASONE) 20 MG tablet 3 tabs by mouth daily x 3 days, then 2 tabs by mouth daily x 2 days then 1 tab by mouth daily x 2 days (Patient not taking: Reported on 04/28/2022) 15 tablet 0   No facility-administered medications prior to visit.     Per HPI unless specifically indicated in ROS section below Review of Systems  Constitutional:  Negative for fatigue and fever.  HENT:  Negative for ear pain.   Eyes:  Negative for pain.  Respiratory:  Positive for cough and shortness of breath.   Cardiovascular:  Negative for chest pain, palpitations and leg swelling.  Gastrointestinal:  Negative for abdominal pain.  Genitourinary:  Negative for dysuria.  Musculoskeletal:  Negative for arthralgias.  Neurological:  Negative for syncope, light-headedness and headaches.  Psychiatric/Behavioral:  Negative for dysphoric mood.    Objective:  BP 106/60   Pulse 93   Temp (!) 97.4 F (36.3 C) (Temporal)   Ht 5' 10.5" (1.791 m)   Wt 264 lb 8 oz (120 kg)   SpO2 97%   BMI 37.42 kg/m   Wt Readings from Last 3 Encounters:  04/28/22 264 lb 8 oz (120 kg)  02/10/22 262 lb 8 oz (119.1 kg)  11/21/21 254 lb 6 oz (115.4 kg)      Physical Exam Constitutional:      Appearance: He is well-developed.  HENT:     Head: Normocephalic.     Right Ear: Hearing normal.     Left Ear: Hearing normal.     Nose: Nose normal.  Neck:     Thyroid: No thyroid mass or thyromegaly.     Vascular: No carotid bruit.     Trachea: Trachea normal.  Cardiovascular:     Rate and Rhythm: Normal rate and regular rhythm.     Pulses: Normal pulses.     Heart sounds: Heart sounds not distant. No murmur heard.    No friction rub.  No gallop.     Comments: No peripheral edema Pulmonary:     Effort: Pulmonary effort is normal.  No respiratory distress.     Breath sounds: Examination of the right-upper field reveals wheezing. Examination of the left-upper field reveals wheezing. Examination of the right-middle field reveals wheezing. Examination of the left-middle field reveals wheezing. Examination of the right-lower field reveals wheezing. Examination of the left-lower field reveals wheezing. Wheezing and rhonchi present.  Skin:    General: Skin is warm and dry.     Findings: No rash.  Psychiatric:        Speech: Speech normal.        Behavior: Behavior normal.        Thought Content: Thought content normal.       Results for orders placed or performed in visit on 01/30/22  PSA  Result Value Ref Range   PSA 0.77 0.10 - 4.00 ng/mL  Uric acid  Result Value Ref Range   Uric Acid, Serum 5.9 4.0 - 7.8 mg/dL  Microalbumin / creatinine urine ratio  Result Value Ref Range   Microalb, Ur 2.5 (H) 0.0 - 1.9 mg/dL   Creatinine,U 103.0 mg/dL   Microalb Creat Ratio 2.4 0.0 - 30.0 mg/g  Comprehensive metabolic panel  Result Value Ref Range   Sodium 139 135 - 145 mEq/L   Potassium 5.0 3.5 - 5.1 mEq/L   Chloride 104 96 - 112 mEq/L   CO2 28 19 - 32 mEq/L   Glucose, Bld 105 (H) 70 - 99 mg/dL   BUN 37 (H) 6 - 23 mg/dL   Creatinine, Ser 2.21 (H) 0.40 - 1.50 mg/dL   Total Bilirubin 0.6 0.2 - 1.2 mg/dL   Alkaline Phosphatase 114 39 - 117 U/L   AST 15 0 - 37 U/L   ALT 14 0 - 53 U/L   Total Protein 6.4 6.0 - 8.3 g/dL   Albumin 4.1 3.5 - 5.2 g/dL   GFR 31.46 (L) >60.00 mL/min   Calcium 9.6 8.4 - 10.5 mg/dL  Lipid panel  Result Value Ref Range   Cholesterol 117 0 - 200 mg/dL   Triglycerides 129.0 0.0 - 149.0 mg/dL   HDL 35.90 (L) >39.00 mg/dL   VLDL 25.8 0.0 - 40.0 mg/dL   LDL Cholesterol 55 0 - 99 mg/dL   Total CHOL/HDL Ratio 3    NonHDL 81.06   Hemoglobin A1c  Result Value Ref Range   Hgb A1c MFr Bld 6.0 4.6 - 6.5 %    Assessment and Plan  Moderate persistent asthma with exacerbation Assessment &  Plan: Acute flare of chronic moderate persistent asthma.  Initial limited response to amoxicillin 875 mg twice daily x 10 days. Now with significant shortness of breath and wheeze.  Will treat with azithromycin course and prednisone 9-day taper.  He will follow his blood sugar while on prednisone and work on low carbohydrate diet.  Continue albuterol 2 puffs inhaled every 4 hours as needed for wheeze.  Continue Allegra 180 mg p.o. daily, Symbicort 2 puffs p.o. twice daily  If he continues to have frequent flares he will consider returning to see Dr. Melvyn Novas pulmonary for further recommendations.   Other orders -     predniSONE; 3 tabs by mouth daily x 3 days, then 2 tabs by mouth daily x 3 days then 1 tab by mouth daily x 3 days  Dispense: 18 tablet; Refill: 0 -     Azithromycin; 2 tab po x 1 day then 1  tab po daily  Dispense: 6 tablet; Refill: 0    No follow-ups on file.   Eliezer Lofts, MD

## 2022-04-28 NOTE — Assessment & Plan Note (Signed)
Acute flare of chronic moderate persistent asthma.  Initial limited response to amoxicillin 875 mg twice daily x 10 days. Now with significant shortness of breath and wheeze.  Will treat with azithromycin course and prednisone 9-day taper.  He will follow his blood sugar while on prednisone and work on low carbohydrate diet.  Continue albuterol 2 puffs inhaled every 4 hours as needed for wheeze.  Continue Allegra 180 mg p.o. daily, Symbicort 2 puffs p.o. twice daily  If he continues to have frequent flares he will consider returning to see Dr. Melvyn Novas pulmonary for further recommendations.

## 2022-05-08 ENCOUNTER — Encounter: Payer: Self-pay | Admitting: Family Medicine

## 2022-05-08 ENCOUNTER — Other Ambulatory Visit: Payer: Self-pay | Admitting: Family Medicine

## 2022-05-08 MED ORDER — PREDNISONE 20 MG PO TABS: ORAL_TABLET | ORAL | 0 refills | Status: DC

## 2022-05-19 ENCOUNTER — Ambulatory Visit: Payer: 59 | Admitting: Family Medicine

## 2022-05-26 ENCOUNTER — Ambulatory Visit (INDEPENDENT_AMBULATORY_CARE_PROVIDER_SITE_OTHER)
Admission: RE | Admit: 2022-05-26 | Discharge: 2022-05-26 | Disposition: A | Discharge status: Home / Self Care | Payer: 59 | Source: Ambulatory Visit | Attending: Family Medicine | Admitting: Family Medicine

## 2022-05-26 ENCOUNTER — Encounter: Payer: Self-pay | Admitting: Family Medicine

## 2022-05-26 ENCOUNTER — Ambulatory Visit (INDEPENDENT_AMBULATORY_CARE_PROVIDER_SITE_OTHER): Payer: 59 | Admitting: Family Medicine

## 2022-05-26 VITALS — BP 110/70 | HR 92 | Temp 97.9°F | Ht 70.5 in

## 2022-05-26 DIAGNOSIS — R0602 Shortness of breath: Secondary | ICD-10-CM

## 2022-05-26 DIAGNOSIS — R053 Chronic cough: Secondary | ICD-10-CM

## 2022-05-26 DIAGNOSIS — E1159 Type 2 diabetes mellitus with other circulatory complications: Secondary | ICD-10-CM | POA: Diagnosis not present

## 2022-05-26 DIAGNOSIS — J4541 Moderate persistent asthma with (acute) exacerbation: Secondary | ICD-10-CM | POA: Diagnosis not present

## 2022-05-26 LAB — POCT GLYCOSYLATED HEMOGLOBIN (HGB A1C): Hemoglobin A1C: 6.1 % — AB (ref 4.0–5.6)

## 2022-05-26 MED ORDER — METHYLPREDNISOLONE ACETATE 80 MG/ML IJ SUSP
80.0000 mg | Freq: Once | INTRAMUSCULAR | Status: AC
  Administered 2022-05-26: 80 mg via INTRAMUSCULAR

## 2022-05-26 MED ORDER — MONTELUKAST SODIUM 10 MG PO TABS: 10.0000 mg | ORAL_TABLET | Freq: Every day | ORAL | 3 refills | Status: DC

## 2022-05-26 MED ORDER — PREDNISONE 20 MG PO TABS: ORAL_TABLET | ORAL | 0 refills | Status: DC

## 2022-05-26 MED ORDER — ALBUTEROL SULFATE (2.5 MG/3ML) 0.083% IN NEBU: 2.5000 mg | INHALATION_SOLUTION | Freq: Four times a day (QID) | RESPIRATORY_TRACT | 1 refills | Status: AC | PRN

## 2022-05-26 MED ORDER — IPRATROPIUM-ALBUTEROL 0.5-2.5 (3) MG/3ML IN SOLN
3.0000 mL | Freq: Once | RESPIRATORY_TRACT | Status: AC
  Administered 2022-05-26: 3 mL via RESPIRATORY_TRACT

## 2022-05-26 NOTE — Patient Instructions (Addendum)
Start Singulair in addition to taking allegra or Xyzal.  Start prednisone taper tommorow.  Wear mask when outside.  Call if not improving in 2-3 days.

## 2022-05-26 NOTE — Assessment & Plan Note (Signed)
Acute, persistent cough now ongoing greater than 4 weeks. Chest x-ray today showed no cardiopulmonary disease. Symptoms may be continued exacerbation from initial illness now status post amoxicillin and azithromycin course.  No further antibiotics indicated at this point given no sign of focal bacterial infection. Allergies may be triggering symptoms as well. Given Depo-Medrol 80 mg x 1 today, treat with prednisone 10-day taper, longer and slower.  Add Singulair to allergy regimen and changed to Xyzal instead of Allegra. He will call if he is not improving as expected.  He has seen a pulmonologist in the past and can consider returning to them.  He is having difficulty using albuterol inhaler given shortness of breath and cough.  He had significant improvement in wheezing with DuoNeb in office today.  I will send in a prescription for nebulizer with albuterol for him to use with rescue.

## 2022-05-26 NOTE — Progress Notes (Signed)
Patient ID: Richard Giles, male    DOB: 1960-12-05, 62 y.o.   MRN: [id number]  This visit was conducted in person.  BP 110/70   Pulse 92   Temp 97.9 F (36.6 C) (Temporal)   Ht 5' 10.5" (1.791 m)   SpO2 96%   BMI 37.42 kg/m    CC:  Chief Complaint  Patient presents with   Diabetes   Weight Management Screening   Cough   Nasal Congestion    Subjective:   HPI: Richard Giles is a 62 y.o. male presenting on 05/26/2022 for Diabetes, Weight Management Screening, Cough, and Nasal Congestion  Diabetes:   Jardiance 25 mg p.o. daily Semaglutide 2 mg weekly.. has held given feeling bad.   Lab Results  Component Value Date   HGBA1C 6.1 (A) 05/26/2022  Using medications without difficulties: Hypoglycemic episodes: none Hyperglycemic episodes: none Feet problems: none Blood Sugars averaging: eye exam within last year: yes  Weight loss has stopped and weight gain has stabilized out Wt Readings from Last 3 Encounters:  04/28/22 264 lb 8 oz (120 kg)  02/10/22 262 lb 8 oz (119.1 kg)  11/21/21 254 lb 6 oz (115.4 kg)   He has new onset symptoms of cough and nasal congestion in the last month.  Nasal congestion, cough, productive ( yellowish, white) History of moderate persistent asthma On allergy medication, Symbicort 160/4.5 mcg 2 puffs twice daily. Using albuterol as needed... about daily  for wheeze and SOb Treated at last office visit March 5 for moderate persistent asthma exacerbation with prednisone taper, azithromycin course. Amoxicillin prior He reports  symptoms  have improved minimally since March.   No ear pain, no fever, no ST, no face pain.  Still with wheeze and SOB.      Feeds cow.. around hay, outside coaching.   Relevant past medical, surgical, family and social history reviewed and updated as indicated. Interim medical history since our last visit reviewed. Allergies and medications reviewed and updated. Outpatient Medications Prior to Visit  Medication  Sig Dispense Refill   ACCU-CHEK GUIDE test strip TEST BLOOD SUGAR EVERY DAY 100 strip 3   Accu-Chek Softclix Lancets lancets TEST BLOOD SUGAR ONCE DAILY     acetaminophen (TYLENOL) 500 MG tablet Take 1,000 mg by mouth every 6 (six) hours as needed (pain).     albuterol (VENTOLIN HFA) 108 (90 Base) MCG/ACT inhaler Inhale 2 puffs into the lungs every 4 (four) hours as needed for wheezing or shortness of breath. 1 each 3   allopurinol (ZYLOPRIM) 300 MG tablet TAKE 1 TABLET BY MOUTH EVERY DAY 90 tablet 3   aspirin 81 MG tablet Take 81 mg by mouth daily.     atorvastatin (LIPITOR) 40 MG tablet TAKE 1 TABLET BY MOUTH EVERY DAY 90 tablet 1   Blood Glucose Monitoring Suppl (ACCU-CHEK GUIDE ME) w/Device KIT daily.     budesonide-formoterol (SYMBICORT) 160-4.5 MCG/ACT inhaler INHALE 2 PUFFS INTO THE LUNGS IN THE MORNING AND AT BEDTIME. 10.2 each 11   cetirizine (ZYRTEC) 10 MG tablet Take 1 tablet (10 mg total) by mouth daily. 14 tablet 0   Cholecalciferol (VITAMIN D3 PO) Take 1 tablet by mouth daily.     cyclobenzaprine (FLEXERIL) 10 MG tablet Take 1 tablet (10 mg total) by mouth 3 (three) times daily as needed for muscle spasms. 20 tablet 0   empagliflozin (JARDIANCE) 25 MG TABS tablet Take 1 tablet (25 mg total) by mouth daily before breakfast. 30 tablet 11  EPINEPHrine 0.3 mg/0.3 mL IJ SOAJ injection Inject 0.3 mg into the muscle as needed for anaphylaxis. 1 each 1   famotidine (PEPCID) 20 MG tablet Take 20 mg by mouth at bedtime.     fexofenadine (ALLEGRA) 180 MG tablet Take 180 mg by mouth daily.      FLUoxetine (PROZAC) 20 MG capsule TAKE 1 CAPSULE BY MOUTH EVERY DAY 90 capsule 1   furosemide (LASIX) 20 MG tablet Take 1 tablet (20 mg total) by mouth daily as needed. 90 tablet 1   irbesartan (AVAPRO) 150 MG tablet TAKE 1 TABLET BY MOUTH EVERY DAY 30 tablet 11   ketoconazole (NIZORAL) 2 % cream Apply 1 Application topically daily. 15 g 0   Probiotic Product (PROBIOTIC PO) Take 1 tablet by mouth  daily.     Semaglutide, 1 MG/DOSE, 4 MG/3ML SOPN Inject 2 mg as directed once a week. 6 mL 3   sodium chloride (OCEAN) 0.65 % SOLN nasal spray Place 1 spray into both nostrils as needed for congestion. 15 mL 0   triamcinolone cream (KENALOG) 0.5 % Apply 1 Application topically 2 (two) times daily. 30 g 0   azithromycin (ZITHROMAX) 250 MG tablet 2 tab po x 1 day then 1 tab po daily 6 tablet 0   predniSONE (DELTASONE) 20 MG tablet 2 tabs by mouth daily x 5  days, then 1 tabs by mouth daily x 5 days 15 tablet 0   No facility-administered medications prior to visit.     Per HPI unless specifically indicated in ROS section below Review of Systems  Constitutional:  Negative for fatigue and fever.  HENT:  Negative for ear pain.   Eyes:  Negative for pain.  Respiratory:  Negative for cough and shortness of breath.   Cardiovascular:  Negative for chest pain, palpitations and leg swelling.  Gastrointestinal:  Negative for abdominal pain.  Genitourinary:  Negative for dysuria.  Musculoskeletal:  Negative for arthralgias.  Neurological:  Negative for syncope, light-headedness and headaches.  Psychiatric/Behavioral:  Negative for dysphoric mood.    Objective:  BP 110/70   Pulse 92   Temp 97.9 F (36.6 C) (Temporal)   Ht 5' 10.5" (1.791 m)   SpO2 96%   BMI 37.42 kg/m   Wt Readings from Last 3 Encounters:  04/28/22 264 lb 8 oz (120 kg)  02/10/22 262 lb 8 oz (119.1 kg)  11/21/21 254 lb 6 oz (115.4 kg)      Physical Exam Constitutional:      Appearance: He is well-developed.  HENT:     Head: Normocephalic.     Right Ear: Hearing normal.     Left Ear: Hearing normal.     Nose: Nose normal.  Neck:     Thyroid: No thyroid mass or thyromegaly.     Vascular: No carotid bruit.     Trachea: Trachea normal.  Cardiovascular:     Rate and Rhythm: Normal rate and regular rhythm.     Pulses: Normal pulses.     Heart sounds: Heart sounds not distant. No murmur heard.    No friction rub. No  gallop.     Comments: No peripheral edema Pulmonary:     Effort: Pulmonary effort is normal. No respiratory distress.     Breath sounds: Wheezing present.     Comments:  Constant coughing Skin:    General: Skin is warm and dry.     Findings: No rash.  Psychiatric:        Speech: Speech normal.  Behavior: Behavior normal.        Thought Content: Thought content normal.       Results for orders placed or performed in visit on 05/26/22  POCT glycosylated hemoglobin (Hb A1C)  Result Value Ref Range   Hemoglobin A1C 6.1 (A) 4.0 - 5.6 %   HbA1c POC (<> result, manual entry)     HbA1c, POC (prediabetic range)     HbA1c, POC (controlled diabetic range)      Assessment and Plan  Type 2 diabetes mellitus with other circulatory complications Assessment & Plan: Chronic, stable control on current regimen He has not been able to work on healthy eating and has been intermittently compliant with medication given his current respiratory illness. I encouraged him to work on compliance with medication.  Jardiance 25 mg p.o. daily Semaglutide 2 mg weekly  Orders: -     POCT glycosylated hemoglobin (Hb A1C)  Persistent cough for 3 weeks or longer -     DG Chest 2 View; Future -     methylPREDNISolone Acetate -     Ipratropium-Albuterol -     For home use only DME Nebulizer machine  SOB (shortness of breath) -     DG Chest 2 View; Future -     methylPREDNISolone Acetate -     Ipratropium-Albuterol -     For home use only DME Nebulizer machine  Moderate persistent asthma with exacerbation Assessment & Plan: Acute, persistent cough now ongoing greater than 4 weeks. Chest x-ray today showed no cardiopulmonary disease. Symptoms may be continued exacerbation from initial illness now status post amoxicillin and azithromycin course.  No further antibiotics indicated at this point given no sign of focal bacterial infection. Allergies may be triggering symptoms as well. Given  Depo-Medrol 80 mg x 1 today, treat with prednisone 10-day taper, longer and slower.  Add Singulair to allergy regimen and changed to Xyzal instead of Allegra. He will call if he is not improving as expected.  He has seen a pulmonologist in the past and can consider returning to them.  He is having difficulty using albuterol inhaler given shortness of breath and cough.  He had significant improvement in wheezing with DuoNeb in office today.  I will send in a prescription for nebulizer with albuterol for him to use with rescue.  Orders: -     For home use only DME Nebulizer machine  Other orders -     predniSONE; 2 tabs by mouth daily x 5 days, then 1 tabs by mouth daily x 5 days.  Dispense: 15 tablet; Refill: 0 -     Montelukast Sodium; Take 1 tablet (10 mg total) by mouth at bedtime.  Dispense: 30 tablet; Refill: 3 -     Albuterol Sulfate; Take 3 mLs (2.5 mg total) by nebulization every 6 (six) hours as needed for wheezing or shortness of breath.  Dispense: 150 mL; Refill: 1    Return in about 3 months (around 08/25/2022) for diabetes follow up.   Eliezer Lofts, MD

## 2022-05-26 NOTE — Assessment & Plan Note (Signed)
Chronic, stable control on current regimen He has not been able to work on healthy eating and has been intermittently compliant with medication given his current respiratory illness. I encouraged him to work on compliance with medication.  Jardiance 25 mg p.o. daily Semaglutide 2 mg weekly

## 2022-05-27 ENCOUNTER — Encounter: Payer: Self-pay | Admitting: Internal Medicine

## 2022-05-29 ENCOUNTER — Encounter: Payer: Self-pay | Admitting: Family Medicine

## 2022-06-03 NOTE — Progress Notes (Signed)
Subjective:     Patient ID: Richard Giles, male   DOB: 1960-06-21   MRN: [id number]  Brief patient profile:  45 yowm never smoker no previous hx of allergies or asthma Richard onset recurrent bronchitis 2005 never completely better since then until placed on qvar in pulmonary clinic 06/2009 c/w cough variant asthma   History of Present Illness   Jun 24, 2009 cc daily cough x 2005 maybe worse in spring and fall seems to peak after stirs around in am can become severe and lightheaded, also occ vomits from coughing. ventolin may helps some but does not eliminate it. prednisone helps some, just finished prednisone last month and much worse since stopped it. minimal actual sputum production. assoc with mild chronic nasal congestion. rec Prednisone x 12 days  Nexium 40 mg Take one 30-60 min before first meal of the day  Pepcid 20 mg at bedtime  Delsym 2 tsp every 12 hours as needed for cough  Stop symbicort   Jul 08, 2009 2 wk followup. Pt states that cough had improved while on prednisone. When he finished prednisone a few days ago cough started to come back. Cough was prod this am with white/yellow sputum. This has occurred despite compliance with diet and ppi. rec add qvar 80 2 bid   July 30, 2009 3 wk followup. Pt states that his cough has resolved.> rec  no change rx  12/12/10 ov/ Richard Giles cc cough resolved completely on qvar and wants it refilled,   No doe >refill qvar , singulair d/c   07/02/2011 Acute OV  Complains of 3 weeks of initial URI that has progressed with increased prod cough with white/yellow mucus, increased SOB, wheezing, tightness in chest. Worse for last 5 days Was called in steroid pack that he finished this am. Prednisone helped only minimally.  Now coughing up thick yellow mucus.  No hemotpysis or chest pain . No edema.  Was under good control with cough until last 3 days.  Using delsym for cough.  Restarted singulair this week.  Restarted Nexium/Pepcid 2 weeks ago.  Has  been using mint cough drops- " forgot about avoiding mints"  rec Zpack take as directed.  Delsym 2 teaspoons twice daily as needed for cough. Avoid Mint products.  Hydromet 1-2 teaspoons every 4-6 hours as needed for cough, may make you sleepy. Use sugarless candy, water, ice chips to help avoid coughing   09/03/2011 f/u ov/Richard Giles cc Patient states better since last visit. States cough is better.  rec Plan A = Qvar 80 Take 2 puffs first thing in am and then another 2 puffs about 12 hours later and blow it out through the nose. Plan B Only use your albuterol as a rescue medication to be used if you can't catch your breath by resting or doing a relaxed purse lip breathing pattern. The less you use it, the better it will work when you need it.  Only use your allegra as a "rescue medication" for your nasal symptoms Try Nexium 40 (prilosec 20mg  )   Take 30-60 min before first meal of the day and Pepcid 20 mg one bedtime until cough is completely gone for at least a week without the need for cough suppression   09/02/2012 f/u ov/Richard Giles re cough variant asthma/ good control on qvar/ here for yearly f/u/ refills Chief Complaint  Patient presents with   Follow-up    Pt states doing well and denies any co's today.   Refill qvar 80 /  f/u prn   07/11/2014 f/u ov/Richard Giles re: recurrent cough  Chief Complaint  Patient presents with   Acute Visit    Pt c/o "cold" for the past 5 wks- now having increased SOB, acid reflux and cough. Cough is prod with white/blood tinged sputum.    qvar 80 2 puffs each am/  nexium /pepcid whenever cough flared which was very rare until around mid April 2016 while on ACEi felt like caught wife's cold with low grade fever and barking quality cough and rx w/in a week started nex/pepcid and took round of pred plus delsym seen by Dr Dayton Martes 06/25/14 rx zpak then May 16 augmentin > to [facility] 07/10/14 p coughing fit > rib injury on L but neg cxr rx tramadol but not taking aggressively. rec Stop  lisinopril avapro (ibesartan 150 mg) one daily in place of lisinopril 40  stop qvar and start dulera 100 Take 2 puffs first thing in am and then another 2 puffs about 12 hours later.  Take delsym two tsp every 12 hours and supplement if needed with  tramadol 50 mg up to 2 every 4 hours   Once you have eliminated the cough for 3 straight days try reducing the tramadol first,  then the delsym as tolerated.   Finish augmentin  Prednisone 10 mg take  4 each am x 2 days,   2 each am x 2 days,  1 each am x 2 days and stop  Increase nexium to 40 mg Take 30-60 min before first meal of the day      03/17/2021  Re establish ov/Richard Giles re: cough variant asthma   maint on qvar  but having more freq flares  Chief Complaint  Patient presents with   Follow-up    Pt states he had bronchitis in Dec 2022- tx with pred and doxy.   Dyspnea:  working on wt loss, walking neighborhood ok unless hills  = MMRC1 = can walk nl pace, flat grade, can't hurry or go uphills or steps s sob   Cough: none  Sleeping:  Olalere w/u in progress SABA MGQ:QPYP now  02: none  Covid status:  vax x 4 no bivalent  Rec I recommend the bivalent covid vaccine  Plan A = Automatic = Always=    dulera 200 Take 2 puffs first thing in am and then another 2 puffs about 12 hours later if a having any resp symptoms at all.   Plan B = Backup (to supplement plan A, not to replace it) Only use your albuterol inhaler as a rescue medication   Please schedule a follow up visit in 6 months but call sooner if needed    09/15/2021  f/u ov/Richard Giles re: asthma flares with URI  maint on symb 160 2bid  and wants to know if can use prn  Chief Complaint  Patient presents with   Follow-up    Breathing is doing well on Symbicort. He rarely uses albuterol.   Dyspnea:  bike riding/ walking / pickle ball Cough: none  Sleeping: cpap olalere x sev months/ flat bed with sev pillows does fine s resp cc  SABA use: none  02: none  Rec Plan A = Automatic = Always=     symbicort 160 up to 2 puffs every 12 hours and down to 1 puff in am - immediately go back to max dose if having symptoms or needing rescue Plan B = Backup (to supplement plan A, not to replace it) Only use your albuterol  inhaler as a rescue medication  [facility] 2 each [facility] / increased to 2 bid at onset    06/05/2022  f/u ov/Richard Giles re: recurrent cough  maint on symbicort 160 no gerd rx  / finishing prednisone  Chief Complaint  Patient presents with   Follow-up    Pt presents today with on ongoing deep rattling cough ongoing since February. Pt has had 2 rounds of antibiotics and 3 rounds of prednisone.   Dyspnea:  can't separate  Cough: white, thick / no sinus cc  Sleeping: better with cpap  SABA use: neb one tid last used 12 h prior  02: none      No obvious day to day or daytime variability or assoc excess/ purulent sputum or mucus plugs or hemoptysis or cp or chest tightness, subjective wheeze or overt sinus or hb symptoms.   Sleeping fine on cpap  without nocturnal  or early am exacerbation  of respiratory  c/o's or need for noct saba. Also denies any obvious fluctuation of symptoms with weather or environmental changes or other aggravating or alleviating factors except as outlined above   No unusual exposure hx or h/o childhood pna/ asthma or knowledge of premature birth.  Current Allergies, Complete Past Medical History, Past Surgical History, Family History, and Social History were reviewed in [facility] electronic medical record.  ROS  The following are not active complaints unless bolded Hoarseness, sore throat, dysphagia, dental problems, itching, sneezing,  nasal congestion or discharge of excess mucus or purulent secretions, ear ache,   fever, chills, sweats, unintended wt loss or wt gain, classically pleuritic or exertional cp,  orthopnea pnd or arm/hand swelling  or leg swelling, presyncope, palpitations, abdominal pain,  anorexia, nausea, vomiting, diarrhea  or change in bowel habits or change in bladder habits, change in stools or change in urine, dysuria, hematuria,  rash, arthralgias, visual complaints, headache, numbness, weakness or ataxia or problems with walking or coordination,  change in mood or  memory.        Current Meds  Medication Sig   ACCU-CHEK GUIDE test strip TEST BLOOD SUGAR EVERY DAY   Accu-Chek Softclix Lancets lancets TEST BLOOD SUGAR ONCE DAILY   acetaminophen (TYLENOL) 500 MG tablet Take 1,000 mg by mouth every 6 (six) hours as needed (pain).   albuterol (PROVENTIL) (2.5 MG/3ML) 0.083% nebulizer solution Take 3 mLs (2.5 mg total) by nebulization every 6 (six) hours as needed for wheezing or shortness of breath.   albuterol (VENTOLIN HFA) 108 (90 Base) MCG/ACT inhaler Inhale 2 puffs into the lungs every 4 (four) hours as needed for wheezing or shortness of breath.   allopurinol (ZYLOPRIM) 300 MG tablet TAKE 1 TABLET BY MOUTH EVERY DAY   aspirin 81 MG tablet Take 81 mg by mouth daily.   atorvastatin (LIPITOR) 40 MG tablet TAKE 1 TABLET BY MOUTH EVERY DAY   Blood Glucose Monitoring Suppl (ACCU-CHEK GUIDE ME) w/Device KIT daily.   budesonide-formoterol (SYMBICORT) 160-4.5 MCG/ACT inhaler INHALE 2 PUFFS INTO THE LUNGS IN THE MORNING AND AT BEDTIME.   cetirizine (ZYRTEC) 10 MG tablet Take 1 tablet (10 mg total) by mouth daily.   Cholecalciferol (VITAMIN D3 PO) Take 1 tablet by mouth daily.   cyclobenzaprine (FLEXERIL) 10 MG tablet Take 1 tablet (10 mg total) by mouth 3 (three) times daily as needed for muscle spasms.   empagliflozin (JARDIANCE) 25 MG TABS tablet Take 1 tablet (25 mg total) by mouth daily before breakfast.  EPINEPHrine 0.3 mg/0.3 mL IJ SOAJ injection Inject 0.3 mg into the muscle as needed for anaphylaxis.   famotidine (PEPCID) 20 MG tablet Take 20 mg by mouth at bedtime.   fexofenadine (ALLEGRA) 180 MG tablet Take 180 mg by mouth daily.    FLUoxetine (PROZAC) 20 MG capsule  TAKE 1 CAPSULE BY MOUTH EVERY DAY   furosemide (LASIX) 20 MG tablet Take 1 tablet (20 mg total) by mouth daily as needed.   irbesartan (AVAPRO) 150 MG tablet TAKE 1 TABLET BY MOUTH EVERY DAY   ketoconazole (NIZORAL) 2 % cream Apply 1 Application topically daily.   montelukast (SINGULAIR) 10 MG tablet Take 1 tablet (10 mg total) by mouth at bedtime.   predniSONE (DELTASONE) 20 MG tablet 2 tabs by mouth daily x 5 days, then 1 tabs by mouth daily x 5 days.   Probiotic Product (PROBIOTIC PO) Take 1 tablet by mouth daily.   Semaglutide, 1 MG/DOSE, 4 MG/3ML SOPN Inject 2 mg as directed once a week.   sodium chloride (OCEAN) 0.65 % SOLN nasal spray Place 1 spray into both nostrils as needed for congestion.   triamcinolone cream (KENALOG) 0.5 % Apply 1 Application topically 2 (two) times daily.                   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 Dr Stevphen [place]  - Maybe trees > did not rec shots per pt  Chronic cough  - onset 2005 > resolved on ICS September 10, 2009  - Allergy profile sent Jul 08, 2009 >> neg  - Sinus Ct ordered Jul 08, 2009 >> neg  - Qvar trial Jul 08, 2009 >> much better July 30, 2009 with 90% effective hfa - flared on ACEi April 2016 > resolved at f/u off acei 07/25/14          Objective:   Physical Exam   wts   06/05/2022       272 09/15/2021       260   03/17/2021       306  07/11/2014       289   > 07/25/2014 288 > 07/30/2014 286     09/02/12 298 lb (135.172 kg)  03/14/12 284 lb (128.822 kg)  02/03/12 281 lb (127.461 kg)    Vital signs reviewed  06/05/2022  - Note at rest 02 sats  95% on RA   General appearance:    obese wm severe coughing fits/harsh honking     HEENT : Oropharynx  clear     Nasal turbinates nl    NECK :  without  apparent JVD/ palpable Nodes/TM    LUNGS: no acc muscle use,  Nl contour chest with marked pseudowheeze/ transmittedupper airway noises    CV:  RRR  no s3 or murmur or increase in P2, and no edema   ABD:  soft and nontender  with nl inspiratory excursion in the supine position. No bruits or organomegaly appreciated   MS:  Nl gait/ ext warm without deformities Or obvious joint restrictions  calf tenderness, cyanosis or clubbing    SKIN: warm and dry without lesions    NEURO:  alert, approp, nl sensorium with  no motor or cerebellar deficits apparent.     I personally reviewed images and agree with radiology impression as follows:  CXR:   pa and lateral 05/26/22   No active cardiopulmonary disease.    Assessment:

## 2022-06-05 ENCOUNTER — Ambulatory Visit (INDEPENDENT_AMBULATORY_CARE_PROVIDER_SITE_OTHER): Payer: 59 | Admitting: Internal Medicine

## 2022-06-05 ENCOUNTER — Encounter: Payer: Self-pay | Admitting: Internal Medicine

## 2022-06-05 VITALS — BP 122/70 | HR 83 | Ht 71.0 in | Wt 272.0 lb

## 2022-06-05 DIAGNOSIS — J45991 Cough variant asthma: Secondary | ICD-10-CM

## 2022-06-05 MED ORDER — FAMOTIDINE 20 MG PO TABS: ORAL_TABLET | ORAL | 11 refills | Status: DC

## 2022-06-05 MED ORDER — BUDESONIDE-FORMOTEROL FUMARATE 80-4.5 MCG/ACT IN AERO
INHALATION_SPRAY | RESPIRATORY_TRACT | 11 refills | Status: DC
Start: 2022-06-05 — End: 2022-07-23

## 2022-06-05 MED ORDER — PANTOPRAZOLE SODIUM 40 MG PO TBEC
40.0000 mg | DELAYED_RELEASE_TABLET | Freq: Every day | ORAL | 2 refills | Status: DC
Start: 2022-06-05 — End: 2022-07-02

## 2022-06-05 MED ORDER — METHYLPREDNISOLONE ACETATE 80 MG/ML IJ SUSP
120.0000 mg | Freq: Once | INTRAMUSCULAR | Status: AC
Start: 2022-06-05 — End: 2022-06-05
  Administered 2022-06-05: 120 mg via INTRAMUSCULAR

## 2022-06-05 MED ORDER — HYDROCODONE BIT-HOMATROP MBR 5-1.5 MG/5ML PO SOLN: 5.0000 mL | Freq: Four times a day (QID) | ORAL | 0 refills | Status: DC | PRN

## 2022-06-05 NOTE — Patient Instructions (Addendum)
Depomedrol 120 mg IM today  Plan A = Automatic = Always=   Symbicort 80 Take 2 puffs first thing in am and then another 2 puffs about 12 hours later.   Work on inhaler technique:  relax and gently blow all the way out then take a nice smooth full deep breath back in, triggering the inhaler at same time you start breathing in.  Hold breath in for at least  5 seconds if you can. Blow out symbicort  thru nose. Rinse and gargle with water when done.  If mouth or throat bother you at all,  try brushing teeth/gums/tongue with arm and hammer toothpaste/ make a slurry and gargle and spit out.      Plan B = Backup (to supplement plan A, not to replace it) Only use your albuterol inhaler as a rescue medication to be used if you can't catch your breath by resting or doing a relaxed purse lip breathing pattern.  - The less you use it, the better it will work when you need it. - Ok to use the inhaler up to 4 hours as needed  For cough/congestion > mucinex dm 1200 mg every 12 hours and supplement and use flutter as much as possible and if still can't stop coughing take hydrocodone 1-2 tsp up to every 4 hours   Pantoprazole (protonix) 40 mg   Take  30-60 min before first meal of the day and Pepcid (famotidine)  20 mg after supper until return to office - this is the best way to tell whether stomach acid is contributing to your problem.    Please schedule a follow up office visit in 2 weeks, sooner if needed  with all medications /inhalers/ solutions in hand so we can verify exactly what you are taking. This includes all medications from all doctors and over the counters

## 2022-06-05 NOTE — Assessment & Plan Note (Addendum)
0nset around 2005 / never smoker> allergy eval by [place] neg x Bees  - 07/11/2014 p extensive coaching HFA effectiveness =    90% rec try dulera 100 2bid - 07/26/14 cough resolved off acei > retry qvar 80 bid  - 07/31/14 > cough flared so rec dulera 100 2bid  - Sinus CT 08/10/2014 > Minor mucosal thickening in the BILATERAL maxillary sinuses without air-fluid level. - 03/17/2021 changed qvar 80 to dulera 200 2bid due to increase in freq of flares - 09/15/2021  After extensive coaching inhaler device,  effectiveness =    90% so rec symb 160 up to 2bid prn flare  - 06/05/2022 reduced symbiocrt to 80 2bid and max gerd rx for flare  Of the three most common causes of  Sub-acute / recurrent or chronic cough, only one (GERD)  can actually contribute to/ trigger  the other two (asthma and post nasal drip syndrome)  and perpetuate the cylce of cough.  While not intuitively obvious, many patients with chronic low grade reflux do not cough until there is a primary insult that disturbs the protective epithelial barrier and exposes sensitive nerve endings.   This is typically viral but can due to PNDS and  either may apply here.   The point is that once this occurs, it is difficult to eliminate the cycle  using anything but a maximally effective acid suppression regimen at least in the short run, accompanied by an appropriate diet to address non acid GERD and control / eliminate the cough itself for at least 3 days with flutter valve and  hydromet >>> also depomedrol 120 mg IM  in case of component of Th-2 driven upper or lower airways inflammation (if cough responds short term only to relapse before return while will on full rx for uacs (as above), then  that would point to allergic rhinitis/ asthma or eos bronchitis as alternative dx)   F/u 2 weeks with all meds in hand using a trust but verify approach to confirm accurate Medication  Reconciliation The principal here is that until we are certain that the  patients are  doing what we've asked, it makes no sense to ask them to do more.          Each maintenance medication was reviewed in detail including emphasizing most importantly the difference between maintenance and prns and under what circumstances the prns are to be triggered using an action plan format where appropriate.  Total time for H and P, chart review, counseling, reviewing hfa/flutter/ neb device(s) and generating customized AVS unique to this office visit / same day charting > 40 min for   refractory respiratory  symptoms of uncertain etiology

## 2022-06-08 ENCOUNTER — Ambulatory Visit: Payer: 59 | Admitting: Internal Medicine

## 2022-06-10 ENCOUNTER — Telehealth: Payer: Self-pay | Admitting: Internal Medicine

## 2022-06-10 MED ORDER — HYDROCODONE BIT-HOMATROP MBR 5-1.5 MG/5ML PO SOLN: 5.0000 mL | Freq: Four times a day (QID) | ORAL | 0 refills | Status: DC | PRN

## 2022-06-10 NOTE — Telephone Encounter (Signed)
Done - the goal was though to take as much as needed for 3 days to stop all coughing then get off the hydrocodone so no further refills on this and if not bette need to move up with f/u with all meds in hand

## 2022-06-10 NOTE — Telephone Encounter (Signed)
PT wife calling for more Hydrocodone syrup for her husband. Pharm needs [place] to call in, she said.   Pharm is [facility] in [place].  Her # [phone]

## 2022-06-10 NOTE — Telephone Encounter (Signed)
Called patient and he is asking for a refill on his hydrocodone cough syrup.  Please advise sir

## 2022-06-10 NOTE — Telephone Encounter (Signed)
Called and left detailed message that Dr Sherene Sires sent in medication. Nothing further needed

## 2022-07-02 ENCOUNTER — Encounter: Payer: Self-pay | Admitting: Internal Medicine

## 2022-07-02 ENCOUNTER — Ambulatory Visit (INDEPENDENT_AMBULATORY_CARE_PROVIDER_SITE_OTHER): Payer: 59 | Admitting: Internal Medicine

## 2022-07-02 VITALS — BP 111/78 | HR 91 | Ht 71.0 in | Wt 275.0 lb

## 2022-07-02 DIAGNOSIS — J45991 Cough variant asthma: Secondary | ICD-10-CM

## 2022-07-02 MED ORDER — PANTOPRAZOLE SODIUM 40 MG PO TBEC: 40.0000 mg | DELAYED_RELEASE_TABLET | Freq: Every day | ORAL | 2 refills | Status: AC

## 2022-07-02 NOTE — Assessment & Plan Note (Signed)
0nset around 2005 / never smoker> allergy eval by [place] neg x Bees  - 07/11/2014 p extensive coaching HFA effectiveness =    90% rec try dulera 100 2bid - 07/26/14 cough resolved off acei > retry qvar 80 bid  - 07/31/14 > cough flared so rec dulera 100 2bid  - Sinus CT 08/10/2014 > Minor mucosal thickening in the BILATERAL maxillary sinuses without air-fluid level. - 03/17/2021 changed qvar 80 to dulera 200 2bid due to increase in freq of flares - 09/15/2021  After extensive coaching inhaler device,  effectiveness =    90% so rec symb 160 up to 2bid prn flare  - 06/05/2022 reduced symbiocrt to 80 2bid and max gerd rx for flare -06/05/2022 added flutter valve  >>>  07/02/2022 reported satisfied with control so rec leave on symb 80 as maint plus gerd rx and add prn 1st gen H1 blockers per guidelines     Thinks he's finally turned the corner so advise on maint rx x 3 m same and see if has breakthru on the lower dose of symbicort which irritates the upper airway less but which he needs to use consistently, rinse and gargle p and add arm and hammer toothpast if any irritation.  F/u 3 m         Each maintenance medication was reviewed in detail including emphasizing most importantly the difference between maintenance and prns and under what circumstances the prns are to be triggered using an action plan format where appropriate.  Total time for H and P, chart review, counseling, reviewing hfa  device(s) and generating customized AVS unique to this office visit / same day charting = 30 min

## 2022-07-02 NOTE — Patient Instructions (Addendum)
Stay on Pantoprazole (protonix) 40 mg   Take  30-60 min before first meal of the day and Pepcid (famotidine)  20 mg after supper until return to office - this is the best way to tell whether stomach acid is contributing to your problem.    For cough IF NEEDED > mucinex dm 1200 mg every 12 hours with flutter  For drainage that doesn't respond to zyrtec (cetirizine) > IF NEEDED   try  CHLORPHENIRAMINE  4 mg  ("Allergy Relief" 4mg   at [facility] should be easiest to find in the blue box usually on bottom shelf)  take one every 4 hours as needed - extremely effective and inexpensive over the counter- may cause drowsiness so start with just a dose or two an hour before bedtime and see how you tolerate it before trying in daytime.    Please schedule a follow up visit in 3 months but call sooner if needed

## 2022-07-02 NOTE — Progress Notes (Signed)
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 ventolin may helps some but does not eliminate it. prednisone helps some, just finished prednisone last month and much worse since stopped it. minimal actual sputum production. assoc with mild chronic nasal congestion. rec Prednisone x 12 days  Nexium 40 mg Take one 30-60 min before first meal of the day  Pepcid 20 mg at bedtime  Delsym 2 tsp every 12 hours as needed for cough  Stop symbicort   Jul 08, 2009 2 wk followup. Pt states that cough had improved while on prednisone. When he finished prednisone a few days ago cough started to come back. Cough was prod this am with white/yellow sputum. This has occurred despite compliance with diet and ppi. rec add qvar 80 2 bid    09/03/2011 f/u ov/Shane Badeaux cc Patient states better since last visit. States cough is better.  rec Plan A = Qvar 80 Take 2 puffs first thing in am and then another 2 puffs about 12 hours later and blow it out through the nose. Plan B Only use your albuterol as a rescue medication to be used if you can't catch your breath by resting or doing a relaxed purse lip breathing pattern. The less you use it, the better it will work when you need it.  Only use your allegra as a "rescue medication" for your nasal symptoms Try Nexium 40 (prilosec 20mg  )   Take 30-60 min before first meal of the day and Pepcid 20 mg one bedtime until cough is completely gone for at least a week without the need for cough suppression   09/02/2012  f/u ov/Alija Riano re cough variant asthma/ good control on qvar/ here for yearly f/u/ refills Chief Complaint  Patient presents with   Follow-up    Pt states doing well and denies any co's today.   Refill qvar 80 / f/u prn   07/11/2014 f/u ov/Emali Heyward re: recurrent cough  Chief Complaint  Patient presents with   Acute Visit    Pt c/o "cold" for the past 5 wks- now having increased SOB, acid reflux and cough. Cough is prod with white/blood tinged sputum.    qvar 80 2 puffs each am/  nexium /pepcid whenever cough flared which was very rare until around mid April 2016 while on ACEi felt like caught wife's cold with low grade fever and barking quality cough and rx w/in a week started nex/pepcid and took round of pred plus delsym seen by Dr Dayton Martes 06/25/14 rx zpak then May 16 augmentin > to [facility] 07/10/14 p coughing fit > rib injury on L but neg cxr rx tramadol but not taking aggressively. rec Stop lisinopril avapro (ibesartan 150 mg) one daily in place of lisinopril 40  stop qvar and start dulera 100 Take 2 puffs first thing in am and then another 2 puffs about 12 hours later.  Take delsym two tsp every 12 hours and supplement if needed with  tramadol 50 mg up  to 2 every 4 hours   Once you have eliminated the cough for 3 straight days try reducing the tramadol first,  then the delsym as tolerated.   Finish augmentin  Prednisone 10 mg take  4 each am x 2 days,   2 each am x 2 days,  1 each am x 2 days and stop  Increase nexium to 40 mg Take 30-60 min before first meal of the day      03/17/2021  Re establish ov/Dezire Turk re: cough variant asthma   maint on qvar  but having more freq flares  Chief Complaint  Patient presents with   Follow-up    Pt states he had bronchitis in Dec 2022- tx with pred and doxy.   Dyspnea:  working on wt loss, walking neighborhood ok unless hills  = [place]MR[place] = can walk nl pace, flat grade, can't hurry or go uphills or steps s sob   Cough: none  Sleeping:  [place] w/u in progress SABA  ZOX:WRUE now  02: none  Covid status:  vax x 4 no bivalent  Rec I recommend the bivalent covid vaccine  Plan A = Automatic = Always=    dulera 200 Take 2 puffs first thing in am and then another 2 puffs about 12 hours later if a having any resp symptoms at all.   Plan B = Backup (to supplement plan A, not to replace it) Only use your albuterol inhaler as a rescue medication   Please schedule a follow up visit in 6 months but call sooner if needed    06/05/2022  f/u ov/[place] office/Shaivi Rothschild re: recurrent cough  maint on symbicort 160 no gerd rx  / finishing prednisone  Chief Complaint  Patient presents with   Follow-up    Pt presents today with on ongoing deep rattling cough ongoing since February. Pt has had 2 rounds of antibiotics and 3 rounds of prednisone.   Dyspnea:  can't separate  Cough: white, thick / no sinus cc  Sleeping: better with cpap  SABA use: neb one tid last used 12 h prior  02: none  Rec Depomedrol 120 mg IM today Plan A = Automatic = Always=   Symbicort 80 Take 2 puffs first thing in am and then another 2 puffs about 12 hours later.   Work on inhaler technique:  Plan B = Backup (to supplement plan A, not to replace it) Only use your albuterol inhaler as a rescue medication For cough/congestion > mucinex dm 1200 mg every 12 hours and supplement and use flutter as much as possible and if still can't stop coughing take hydrocodone 1-2 tsp up to every 4 hours  Pantoprazole (protonix) 40 mg   Take  30-60 min before first meal of the day and Pepcid (famotidine)  20 mg after supper until return to office Please schedule a follow up office visit in 2 weeks, sooner if needed  with all medications /inhalers/ solutions in hand     07/02/2022  f/u ov/[place] office/Joyel Chenette re: cough variant asthma maint on symbicort   did not bring inhalers  Chief Complaint  Patient presents with   Follow-up    Pt f/u states that he is doing much better than LOV  Dyspnea:  Not limited by  breathing from desired activities   Cough: some throat clearing  Sleeping: on cpap helps cough - recliner has helped in past  SABA use: rarely  02: none  No obvious day to day or daytime variability or assoc  excess/ purulent sputum or mucus plugs or hemoptysis or cp or chest tightness, subjective wheeze or overt sinus or hb symptoms.   Sleeping as above  without nocturnal  or early am exacerbation  of respiratory  c/o's or need for noct saba. Also denies any obvious fluctuation of symptoms with weather or environmental changes or other aggravating or alleviating factors except as outlined above   No unusual exposure hx or h/o childhood pna/ asthma or knowledge of premature birth.  Current Allergies, Complete Past Medical History, Past Surgical History, Family History, and Social History were reviewed in [employer] record.  ROS  The following are not active complaints unless bolded Hoarseness, sore throat, dysphagia, dental problems, itching, sneezing,  nasal congestion or discharge of excess mucus or purulent secretions, ear ache,   fever, chills, sweats, unintended wt loss or wt gain, classically pleuritic or exertional cp,  orthopnea pnd or arm/hand swelling  or leg swelling, presyncope, palpitations, abdominal pain, anorexia, nausea, vomiting, diarrhea  or change in bowel habits or change in bladder habits, change in stools or change in urine, dysuria, hematuria,  rash, arthralgias, visual complaints, headache, numbness, weakness or ataxia or problems with walking or coordination,  change in mood or  memory.        Current Meds  Medication Sig   ACCU-CHEK GUIDE test strip TEST BLOOD SUGAR EVERY DAY   Accu-Chek Softclix Lancets lancets TEST BLOOD SUGAR ONCE DAILY   acetaminophen (TYLENOL) 500 MG tablet Take 1,000 mg by mouth every 6 (six) hours as needed (pain).   albuterol (PROVENTIL) (2.5 MG/3ML) 0.083% nebulizer solution Take 3 mLs (2.5 mg total) by nebulization  every 6 (six) hours as needed for wheezing or shortness of breath.   albuterol (VENTOLIN HFA) 108 (90 Base) MCG/ACT inhaler Inhale 2 puffs into the lungs every 4 (four) hours as needed for wheezing or shortness of breath.   allopurinol (ZYLOPRIM) 300 MG tablet TAKE 1 TABLET BY MOUTH EVERY DAY   aspirin 81 MG tablet Take 81 mg by mouth daily.   atorvastatin (LIPITOR) 40 MG tablet TAKE 1 TABLET BY MOUTH EVERY DAY   Blood Glucose Monitoring Suppl (ACCU-CHEK GUIDE ME) w/Device KIT daily.   budesonide-formoterol (SYMBICORT) 80-4.5 MCG/ACT inhaler Take 2 puffs first thing in am and then another 2 puffs about 12 hours later.   cetirizine (ZYRTEC) 10 MG tablet Take 1 tablet (10 mg total) by mouth daily.   Cholecalciferol (VITAMIN D3 PO) Take 1 tablet by mouth daily.   cyclobenzaprine (FLEXERIL) 10 MG tablet Take 1 tablet (10 mg total) by mouth 3 (three) times daily as needed for muscle spasms.   empagliflozin (JARDIANCE) 25 MG TABS tablet Take 1 tablet (25 mg total) by mouth daily before breakfast.   EPINEPHrine 0.3 mg/0.3 mL IJ SOAJ injection Inject 0.3 mg into the muscle as needed for anaphylaxis.   famotidine (PEPCID) 20 MG tablet One after supper   fexofenadine (ALLEGRA) 180 MG tablet Take 180 mg by mouth daily.    FLUoxetine (PROZAC) 20 MG capsule TAKE 1 CAPSULE BY MOUTH EVERY DAY   furosemide (LASIX) 20 MG tablet Take 1 tablet (20 mg total) by mouth daily as needed.   irbesartan (AVAPRO) 150 MG tablet TAKE 1 TABLET BY MOUTH EVERY DAY   ketoconazole (NIZORAL) 2 % cream Apply 1 Application topically daily.   montelukast (SINGULAIR) 10 MG tablet Take 1 tablet (10 mg total) by mouth at bedtime.   pantoprazole (PROTONIX) 40 MG tablet Take 1 tablet (40  mg total) by mouth daily. Take 30-60 min before first meal of the day   Probiotic Product (PROBIOTIC PO) Take 1 tablet by mouth daily.   Semaglutide, 1 MG/DOSE, 4 MG/3ML SOPN Inject 2 mg as directed once a week.   sodium chloride (OCEAN) 0.65 % SOLN  nasal spray Place 1 spray into both nostrils as needed for congestion.   triamcinolone cream (KENALOG) 0.5 % Apply 1 Application topically 2 (two) times daily.              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 Dr Stevphen [place]  - Maybe trees > did not rec shots per pt  Chronic cough  - onset 2005 > resolved on ICS September 10, 2009  - Allergy profile sent Jul 08, 2009 >> neg  - Sinus Ct ordered Jul 08, 2009 >> neg  - Qvar trial Jul 08, 2009 >> much better July 30, 2009 with 90% effective hfa - flared on ACEi April 2016 > resolved at f/u off acei 07/25/14          Objective:   Physical Exam   wts   07/02/2022         275  06/05/2022       272 09/15/2021       260   03/17/2021       306  07/11/2014       289   > 07/25/2014 288 > 07/30/2014 286     09/02/12 298 lb (135.172 kg)  03/14/12 284 lb (128.822 kg)  02/03/12 281 lb (127.461 kg)    Vital signs reviewed  07/02/2022  - Note at rest 02 sats  96% on RA   General appearance:    mod obese (by BMI) pleasant amb wm nad     HEENT : Oropharynx  clear/ min cobblestoning/ min watery pnd     Nasal turbinates nl    NECK :  without  apparent JVD/ palpable Nodes/TM    LUNGS: no acc muscle use,  Nl contour chest which is clear to A and P bilaterally without cough on insp or exp maneuvers   CV:  RRR  no s3 or murmur or increase in P2, and no edema   ABD:  quite obese  but soft and nontender   MS:  Nl gait/ ext warm without deformities Or obvious joint restrictions  calf tenderness, cyanosis or clubbing    SKIN: warm and dry without lesions    NEURO:  alert, approp, nl sensorium with  no motor or cerebellar deficits apparent.         Assessment:

## 2022-07-08 ENCOUNTER — Ambulatory Visit: Payer: 59 | Admitting: Internal Medicine

## 2022-07-22 ENCOUNTER — Encounter: Payer: Self-pay | Admitting: Internal Medicine

## 2022-07-23 ENCOUNTER — Other Ambulatory Visit: Payer: Self-pay

## 2022-07-23 MED ORDER — BUDESONIDE-FORMOTEROL FUMARATE 80-4.5 MCG/ACT IN AERO: INHALATION_SPRAY | RESPIRATORY_TRACT | 11 refills | Status: DC

## 2022-07-23 NOTE — Telephone Encounter (Signed)
Can't do the hydrocoone s ov as it is a controlled drug - best option until can get to ov is delsym 2 tsp bid and supplement with tessalon 200 q 6 h prn

## 2022-07-28 ENCOUNTER — Other Ambulatory Visit: Payer: Self-pay | Admitting: Family Medicine

## 2022-08-12 ENCOUNTER — Other Ambulatory Visit: Payer: Self-pay | Admitting: Family Medicine

## 2022-08-18 ENCOUNTER — Ambulatory Visit (INDEPENDENT_AMBULATORY_CARE_PROVIDER_SITE_OTHER): Payer: 59 | Admitting: Family Medicine

## 2022-08-18 ENCOUNTER — Encounter: Payer: Self-pay | Admitting: Family Medicine

## 2022-08-18 VITALS — BP 126/74 | HR 84 | Ht 71.0 in | Wt 277.2 lb

## 2022-08-18 DIAGNOSIS — H8113 Benign paroxysmal vertigo, bilateral: Secondary | ICD-10-CM

## 2022-08-18 DIAGNOSIS — H8110 Benign paroxysmal vertigo, unspecified ear: Secondary | ICD-10-CM | POA: Insufficient documentation

## 2022-08-18 MED ORDER — MECLIZINE HCL 25 MG PO TABS: 25.0000 mg | ORAL_TABLET | Freq: Three times a day (TID) | ORAL | 0 refills | Status: AC | PRN

## 2022-08-18 MED ORDER — FLUTICASONE PROPIONATE 50 MCG/ACT NA SUSP: 2.0000 | Freq: Every day | NASAL | 1 refills | Status: DC

## 2022-08-18 NOTE — Assessment & Plan Note (Signed)
Intermittent dizziness with position change for 1 day   (improved now)  Started when he lay flat, also some ear pressure and congestion Normal neuro exam (nystagmus was noted) Prescription meclizine given to use prn and update in several days Also flonase Advised to change position slowly  See AVS ER precautions noted - aware of signs and symptoms of CVA Will update  Consider PT if needed if not improving

## 2022-08-18 NOTE — Patient Instructions (Addendum)
If symptoms get severe or if you develop stroke symptoms - call 911   Try the meclizine for dizziness as needed - it will sedate   Move slowly  Change positions slowly / especially rolling over in bed   Let us know if this does not improve in the next 2-3 days    Stay out of heat  Stay hydrated

## 2022-08-18 NOTE — Progress Notes (Signed)
Subjective:    Patient ID: Richard Giles, male    DOB: 1960-10-16, 62 y.o.   MRN: [id number]  HPI  Wt Readings from Last 3 Encounters:  08/18/22 277 lb 3.2 oz (125.7 kg)  07/02/22 275 lb (124.7 kg)  06/05/22 272 lb (123.4 kg)   38.66 kg/m  Vitals:   08/18/22 1242  BP: 126/74  Pulse: 84  SpO2: 95%    62 yo pt of Dr Ermalene Searing  Has history of CKD and DM2 and HTN and DM and cough variant asthma  Presents with dizziness   Started having dizzy spells yesterday,  has had 3 since then. Room feels like its spinning. Sometimes he is unable to keep his eyes open during dizzy spell, unsure if related to ear infection.  Nausea but not vomiting   Happened yesterday afternoon  Then again 3-4 am (rolled over)   Right ear  - felt funny/ like something in it  A little congestion  No fever   Took a dramamine earlier today  [place] on a ladder (inside and air conditioning)   Has had this a few times in the past    Patient Active Problem List   Diagnosis Date Noted   BPV (benign positional vertigo) 08/18/2022   Moderate persistent asthma with exacerbation 11/19/2020   CKD (chronic kidney disease), stage III (HCC) 02/20/2018   Dysplastic nevus of trunk 02/04/2018    Class: History of   RLS (restless legs syndrome) 12/17/2015   Class 2 severe obesity due to excess calories with serious comorbidity and body mass index (BMI) of 35.0 to 35.9 in adult (HCC) 12/17/2015   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 Stevphen Rochester : Maybe trees Did not receive shots per patient   Chronic bronchitis (HCC)    Chronic cough onset 2005/ resolved on ICS July 19,2011   Sinus CT negative 07/08/2009 negative   Chronic kidney disease    per pt 01-04-2015--autoimmune disease per pt   COPD (chronic obstructive pulmonary disease) (HCC)    per pt 01-04-15   Diabetes mellitus without complication (HCC)    Dysplastic nevus of trunk 02/04/2018   GERD (gastroesophageal reflux disease)    Hyperlipidemia    Hypertension    Restless leg syndrome    Sleep apnea    does not have to use cpap   Past Surgical History:  Procedure Laterality Date   bialteral inguinal hernia  1967   COLONOSCOPY  01/14/2015   FOOT SURGERY  09/03/2003   Left foot osteophye removal (Dr. August Saucer)   POLYPECTOMY     SEPTOPLASTY  1983   at 62 years of age   Social History   Tobacco Use   Smoking status: Never   Smokeless tobacco: Former    Quit date: 02/23/1993  Vaping Use   Vaping Use: Never used  Substance Use Topics   Alcohol use: No   Drug use: No   Family History  Problem Relation Age of Onset  Hypertension Father    Colon polyps Father    Diabetes Maternal Grandmother    Parkinson's disease Mother    Depression Neg Hx    Drug abuse Neg Hx    Stroke Neg Hx    Colon cancer Neg Hx    Stomach cancer Neg Hx    Esophageal cancer Neg Hx    Rectal cancer Neg Hx    Allergies  Allergen Reactions   Bee Venom Anaphylaxis   Hornet Venom Anaphylaxis   Lisinopril Cough    Other reaction(s): Cough Other reaction(s): Cough   Codeine Diarrhea, Nausea And Vomiting and Rash   Current Outpatient Medications on File Prior to Visit  Medication Sig Dispense Refill   ACCU-CHEK GUIDE test strip TEST BLOOD SUGAR EVERY DAY 100 strip 3   Accu-Chek Softclix Lancets lancets TEST BLOOD SUGAR ONCE DAILY     allopurinol (ZYLOPRIM) 300 MG tablet TAKE 1 TABLET BY MOUTH EVERY DAY 90 tablet 3   aspirin 81 MG  tablet Take 81 mg by mouth daily.     atorvastatin (LIPITOR) 40 MG tablet TAKE 1 TABLET BY MOUTH EVERY DAY 90 tablet 1   Blood Glucose Monitoring Suppl (ACCU-CHEK GUIDE ME) w/Device KIT daily.     budesonide-formoterol (SYMBICORT) 80-4.5 MCG/ACT inhaler Take 2 puffs first thing in am and then another 2 puffs about 12 hours later.Take 2 puffs first thing in am and then another 2 puffs about 12 hours later. 1 each 11   cetirizine (ZYRTEC) 10 MG tablet Take 1 tablet (10 mg total) by mouth daily. 14 tablet 0   Cholecalciferol (VITAMIN D3 PO) Take 1 tablet by mouth daily.     cyclobenzaprine (FLEXERIL) 10 MG tablet Take 1 tablet (10 mg total) by mouth 3 (three) times daily as needed for muscle spasms. 20 tablet 0   empagliflozin (JARDIANCE) 25 MG TABS tablet Take 1 tablet (25 mg total) by mouth daily before breakfast. 30 tablet 11   famotidine (PEPCID) 20 MG tablet One after supper 30 tablet 11   fexofenadine (ALLEGRA) 180 MG tablet Take 180 mg by mouth daily.      FLUoxetine (PROZAC) 20 MG capsule TAKE 1 CAPSULE BY MOUTH EVERY DAY 90 capsule 1   furosemide (LASIX) 20 MG tablet Take 1 tablet (20 mg total) by mouth daily as needed. 90 tablet 1   irbesartan (AVAPRO) 150 MG tablet TAKE 1 TABLET BY MOUTH EVERY DAY 30 tablet 11   montelukast (SINGULAIR) 10 MG tablet TAKE 1 TABLET BY MOUTH EVERYDAY AT BEDTIME 90 tablet 1   pantoprazole (PROTONIX) 40 MG tablet Take 1 tablet (40 mg total) by mouth daily. Take 30-60 min before first meal of the day 30 tablet 2   Probiotic Product (PROBIOTIC PO) Take 1 tablet by mouth daily.     Semaglutide, 1 MG/DOSE, 4 MG/3ML SOPN Inject 2 mg as directed once a week. 6 mL 3   acetaminophen (TYLENOL) 500 MG tablet Take 1,000 mg by mouth every 6 (six) hours as needed (pain). (Patient not taking: Reported on 08/18/2022)     albuterol (PROVENTIL) (2.5 MG/3ML) 0.083% nebulizer solution Take 3 mLs (2.5 mg total) by nebulization every 6 (six) hours as needed for wheezing or shortness  of breath. (Patient not taking: Reported on 08/18/2022) 150 mL 1   albuterol (VENTOLIN HFA) 108 (90 Base) MCG/ACT inhaler Inhale 2 puffs into the lungs every 4 (four) hours as needed for wheezing or shortness of breath. (Patient not taking: Reported on 08/18/2022) 1 each 3  EPINEPHrine 0.3 mg/0.3 mL IJ SOAJ injection Inject 0.3 mg into the muscle as needed for anaphylaxis. (Patient not taking: Reported on 08/18/2022) 1 each 1   ketoconazole (NIZORAL) 2 % cream Apply 1 Application topically daily. (Patient not taking: Reported on 08/18/2022) 15 g 0   sodium chloride (OCEAN) 0.65 % SOLN nasal spray Place 1 spray into both nostrils as needed for congestion. (Patient not taking: Reported on 08/18/2022) 15 mL 0   triamcinolone cream (KENALOG) 0.5 % Apply 1 Application topically 2 (two) times daily. (Patient not taking: Reported on 08/18/2022) 30 g 0   No current facility-administered medications on file prior to visit.    Review of Systems  Constitutional:  Negative for activity change, appetite change, fatigue, fever and unexpected weight change.  HENT:  Negative for congestion, rhinorrhea, sore throat and trouble swallowing.   Eyes:  Negative for pain, redness, itching and visual disturbance.  Respiratory:  Negative for cough, chest tightness, shortness of breath and wheezing.   Cardiovascular:  Negative for chest pain and palpitations.  Gastrointestinal:  Positive for nausea. Negative for abdominal pain, blood in stool, constipation, diarrhea and vomiting.  Endocrine: Negative for cold intolerance, heat intolerance, polydipsia and polyuria.  Genitourinary:  Negative for difficulty urinating, dysuria, frequency and urgency.  Musculoskeletal:  Negative for arthralgias, joint swelling and myalgias.  Skin:  Negative for pallor and rash.  Neurological:  Positive for dizziness. Negative for tremors, weakness, numbness and headaches.  Hematological:  Negative for adenopathy. Does not bruise/bleed easily.   Psychiatric/Behavioral:  Negative for decreased concentration and dysphoric mood. The patient is not nervous/anxious.        Objective:   Physical Exam Constitutional:      General: He is not in acute distress.    Appearance: Normal appearance. He is well-developed. He is obese. He is not ill-appearing or diaphoretic.  HENT:     Head: Normocephalic and atraumatic.     Right Ear: External ear normal.     Left Ear: External ear normal.     Nose: Nose normal.     Mouth/Throat:     Pharynx: No oropharyngeal exudate.  Eyes:     General: No scleral icterus.       Right eye: No discharge.        Left eye: No discharge.     Conjunctiva/sclera: Conjunctivae normal.     Pupils: Pupils are equal, round, and reactive to light.     Comments: 2-3 beats or horiz nystagmus bilaterally  Neck:     Thyroid: No thyromegaly.     Vascular: No carotid bruit or JVD.     Trachea: No tracheal deviation.  Cardiovascular:     Rate and Rhythm: Normal rate and regular rhythm.     Heart sounds: Normal heart sounds. No murmur heard. Pulmonary:     Effort: Pulmonary effort is normal. No respiratory distress.     Breath sounds: Normal breath sounds. No wheezing or rales.  Abdominal:     General: Bowel sounds are normal. There is no distension.     Palpations: Abdomen is soft. There is no mass.     Tenderness: There is no abdominal tenderness.  Musculoskeletal:        General: No tenderness.     Cervical back: Full passive range of motion without pain, normal range of motion and neck supple.  Lymphadenopathy:     Cervical: No cervical adenopathy.  Skin:    General: Skin is warm and dry.  Coloration: Skin is not pale.     Findings: No rash.  Neurological:     Mental Status: He is alert and oriented to person, place, and time.     Cranial Nerves: No cranial nerve deficit.     Sensory: No sensory deficit.     Motor: No weakness, tremor, atrophy or abnormal muscle tone.     Coordination:  Coordination normal.     Gait: Gait normal.     Deep Tendon Reflexes: Reflexes are normal and symmetric. Reflexes normal.     Comments: No focal cerebellar signs   Psychiatric:        Behavior: Behavior normal.        Thought Content: Thought content normal.           Assessment & Plan:   Problem List Items Addressed This Visit       Nervous and Auditory   BPV (benign positional vertigo) - Primary    Intermittent dizziness with position change for 1 day   (improved now)  Started when he lay flat, also some ear pressure and congestion Normal neuro exam (nystagmus was noted) Prescription meclizine given to use prn and update in several days Also flonase Advised to change position slowly  See AVS ER precautions noted - aware of signs and symptoms of CVA Will update  Consider PT if needed if not improving

## 2022-08-27 ENCOUNTER — Other Ambulatory Visit: Payer: Self-pay | Admitting: Family Medicine

## 2022-09-10 ENCOUNTER — Other Ambulatory Visit: Payer: Self-pay | Admitting: Family Medicine

## 2022-09-12 ENCOUNTER — Other Ambulatory Visit: Payer: Self-pay | Admitting: Family Medicine

## 2022-09-24 NOTE — Progress Notes (Unsigned)
Subjective:     Patient ID: Richard Giles, male   DOB: 1960/08/19   MRN: [id number]  Brief patient profile:  51 yowm never smoker no previous hx of allergies or asthma new onset recurrent bronchitis 2005 never completely better since then until placed on qvar in pulmonary clinic 06/2009 c/w cough variant asthma   Allergy eval by Dr Jethro Bolus at [place] [facility] no shots    History of Present Illness   Jun 24, 2009 cc daily cough x 2005 maybe worse in spring and fall seems to peak after stirs around in am can become severe and lightheaded, also occ vomits from coughing. ventolin may helps some but does not eliminate it. prednisone helps some, just finished prednisone last month and much worse since stopped it. minimal actual sputum production. assoc with mild chronic nasal congestion. rec Prednisone x 12 days  Nexium 40 mg Take one 30-60 min before first meal of the day  Pepcid 20 mg at bedtime  Delsym 2 tsp every 12 hours as needed for cough  Stop symbicort   Jul 08, 2009 2 wk followup. Pt states that cough had improved while on prednisone. When he finished prednisone a few days ago cough started to come back. Cough was prod this am with white/yellow sputum. This has occurred despite compliance with diet and ppi. rec add qvar 80 2 bid    09/03/2011 f/u ov/ cc Patient states better since last visit. States cough is better.  rec Plan A = Qvar 80 Take 2 puffs first thing in am and then another 2 puffs about 12 hours later and blow it out through the nose. Plan B Only use your albuterol as a rescue medication to be used if you can't catch your breath by resting or doing a relaxed purse lip breathing pattern. The less you use it, the better it will work when you need it.  Only use your allegra as a "rescue medication" for your nasal symptoms Try Nexium 40 (prilosec 20mg  )   Take 30-60 min before first meal of the day and Pepcid 20 mg one bedtime until cough is completely gone for at  least a week without the need for cough suppression   09/02/2012 f/u ov/ re cough variant asthma/ good control on qvar/ here for yearly f/u/ refills Chief Complaint  Patient presents with   Follow-up    Pt states doing well and denies any co's today.   Refill qvar 80 / f/u prn   07/11/2014 f/u ov/ re: recurrent cough  Chief Complaint  Patient presents with   Acute Visit    Pt c/o "cold" for the past 5 wks- now having increased SOB, acid reflux and cough. Cough is prod with white/blood tinged sputum.    qvar 80 2 puffs each am/  nexium /pepcid whenever cough flared which was very rare until around mid April 2016 while on ACEi felt like caught wife's cold with low grade fever and barking quality cough and rx w/in a week started nex/pepcid and took round of pred plus delsym seen by Dr Dayton Martes 06/25/14 rx zpak then May 16 augmentin > to [facility] 07/10/14 p coughing fit > rib injury on L but neg cxr rx tramadol but not taking aggressively. rec Stop lisinopril avapro (ibesartan 150 mg) one daily in place of lisinopril 40  stop qvar and start dulera 100 Take 2 puffs first thing in am and then another 2 puffs about 12 hours later.  Take delsym two tsp  every 12 hours and supplement if needed with  tramadol 50 mg up to 2 every 4 hours   Once you have eliminated the cough for 3 straight days try reducing the tramadol first,  then the delsym as tolerated.   Finish augmentin  Prednisone 10 mg take  4 each am x 2 days,   2 each am x 2 days,  1 each am x 2 days and stop  Increase nexium to 40 mg Take 30-60 min before first meal of the day      03/17/2021  Re establish ov/ re: cough variant asthma   maint on qvar  but having more freq flares  Chief Complaint  Patient presents with   Follow-up    Pt states he had bronchitis in Dec 2022- tx with pred and doxy.   Dyspnea:  working on wt loss, walking neighborhood ok unless hills  = MMRC1 = can walk nl pace, flat grade, can't hurry or go uphills or  steps s sob   Cough: none  Sleeping:  [place] w/u in progress SABA ZOX:WRUE now  02: none  Covid status:  vax x 4 no bivalent  Rec I recommend the bivalent covid vaccine  Plan A = Automatic = Always=    dulera 200 Take 2 puffs first thing in am and then another 2 puffs about 12 hours later if a having any resp symptoms at all.   Plan B = Backup (to supplement plan A, not to replace it) Only use your albuterol inhaler as a rescue medication   Please schedule a follow up visit in 6 months but call sooner if needed    06/05/2022  f/u ov/[place] office/ re: recurrent cough  maint on symbicort 160 no gerd rx  / finishing prednisone  Chief Complaint  Patient presents with   Follow-up    Pt presents today with on ongoing deep rattling cough ongoing since February. Pt has had 2 rounds of antibiotics and 3 rounds of prednisone.   Dyspnea:  can't separate  Cough: white, thick / no sinus cc  Sleeping: better with cpap  SABA use: neb one tid last used 12 h prior  02: none  Rec Depomedrol 120 mg IM today Plan A = Automatic = Always=   Symbicort 80 Take 2 puffs first thing in am and then another 2 puffs about 12 hours later.   Work on inhaler technique:  Plan B = Backup (to supplement plan A, not to replace it) Only use your albuterol inhaler as a rescue medication For cough/congestion > mucinex dm 1200 mg every 12 hours and supplement and use flutter as much as possible and if still can't stop coughing take hydrocodone 1-2 tsp up to every 4 hours  Pantoprazole (protonix) 40 mg   Take  30-60 min before first meal of the day and Pepcid (famotidine)  20 mg after supper until return to office Please schedule a follow up office visit in 2 weeks, sooner if needed  with all medications /inhalers/ solutions in hand     07/02/2022  f/u ov/[place] office/ re: cough variant asthma maint on symbicort  did not bring inhalers  Chief Complaint  Patient presents with   Follow-up    Pt f/u states  that he is doing much better than LOV  Dyspnea:  Not limited by breathing from desired activities   Cough: some throat clearing  Sleeping: on cpap helps cough - recliner has helped in past  SABA use: rarely  02:  none  Rec Stay on Pantoprazole (protonix) 40 mg   Take  30-60 min before first meal of the day and Pepcid (famotidine)  20 mg after supper until return to office   For cough IF NEEDED > mucinex dm 1200 mg every 12 hours with flutter For drainage that doesn't respond to zyrtec (cetirizine) > IF NEEDED   try  CHLORPHENIRAMINE  4 mg      09/25/2022  f/u ov/[place] office/ re: ? Cough varaint asthma maint on symbicort 80 2bid with  flare 06/2022 rec delsym and tessalon 200 but did not have the tessalon Chief Complaint  Patient presents with   Cough variant asthma  Dyspnea:  rides bike never aerobics  Cough: gone x sev months p 4-5 days of fathers hydromet  Tends to be worse daytime despite zyrtec  Sleeping: flat bed / cpap / 2 pillows cough worse off it  SABA use: no need  02: none  More sneezing with outdoor mowing    No obvious day to day or daytime variability or assoc excess/ purulent sputum or mucus plugs or hemoptysis or cp or chest tightness, subjective wheeze or overt  hb symptoms.   *** without nocturnal  or early am exacerbation  of respiratory  c/o's or need for noct saba. Also denies any obvious fluctuation of symptoms with weather or environmental changes or other aggravating or alleviating factors except as outlined above   No unusual exposure hx or h/o childhood pna/ asthma or knowledge of premature birth.  Current Allergies, Complete Past Medical History, Past Surgical History, Family History, and Social History were reviewed in [employer] record.  ROS  The following are not active complaints unless bolded Hoarseness, sore throat, dysphagia, dental problems, itching, sneezing,  nasal congestion or discharge of excess mucus or purulent  secretions, ear ache,   fever, chills, sweats, unintended wt loss or wt gain, classically pleuritic or exertional cp,  orthopnea pnd or arm/hand swelling  or leg swelling, presyncope, palpitations, abdominal pain, anorexia, nausea, vomiting, diarrhea  or change in bowel habits or change in bladder habits, change in stools or change in urine, dysuria, hematuria,  rash, arthralgias, visual complaints, headache, numbness, weakness or ataxia or problems with walking or coordination,  change in mood or  memory.        Current Meds  Medication Sig   ACCU-CHEK GUIDE test strip TEST BLOOD SUGAR EVERY DAY   Accu-Chek Softclix Lancets lancets TEST BLOOD SUGAR ONCE DAILY   acetaminophen (TYLENOL) 500 MG tablet Take 1,000 mg by mouth every 6 (six) hours as needed (pain).   albuterol (PROVENTIL) (2.5 MG/3ML) 0.083% nebulizer solution Take 3 mLs (2.5 mg total) by nebulization every 6 (six) hours as needed for wheezing or shortness of breath.   albuterol (VENTOLIN HFA) 108 (90 Base) MCG/ACT inhaler Inhale 2 puffs into the lungs every 4 (four) hours as needed for wheezing or shortness of breath.   allopurinol (ZYLOPRIM) 300 MG tablet TAKE 1 TABLET BY MOUTH EVERY DAY   aspirin 81 MG tablet Take 81 mg by mouth daily.   atorvastatin (LIPITOR) 40 MG tablet TAKE 1 TABLET BY MOUTH EVERY DAY   Blood Glucose Monitoring Suppl (ACCU-CHEK GUIDE ME) w/Device KIT daily.   budesonide-formoterol (SYMBICORT) 80-4.5 MCG/ACT inhaler Take 2 puffs first thing in am and then another 2 puffs about 12 hours later.Take 2 puffs first thing in am and then another 2 puffs about 12 hours later.   cetirizine (ZYRTEC) 10 MG tablet Take  1 tablet (10 mg total) by mouth daily.   Cholecalciferol (VITAMIN D3 PO) Take 1 tablet by mouth daily.   cyclobenzaprine (FLEXERIL) 10 MG tablet Take 1 tablet (10 mg total) by mouth 3 (three) times daily as needed for muscle spasms.   empagliflozin (JARDIANCE) 25 MG TABS tablet Take 1 tablet (25 mg total) by  mouth daily before breakfast.   EPINEPHrine 0.3 mg/0.3 mL IJ SOAJ injection Inject 0.3 mg into the muscle as needed for anaphylaxis.   famotidine (PEPCID) 20 MG tablet One after supper   fexofenadine (ALLEGRA) 180 MG tablet Take 180 mg by mouth daily.    FLUoxetine (PROZAC) 20 MG capsule TAKE 1 CAPSULE BY MOUTH EVERY DAY   fluticasone (FLONASE) 50 MCG/ACT nasal spray SPRAY 2 SPRAYS INTO EACH NOSTRIL EVERY DAY   furosemide (LASIX) 20 MG tablet TAKE 1 TABLET BY MOUTH EVERY DAY AS NEEDED   irbesartan (AVAPRO) 150 MG tablet TAKE 1 TABLET BY MOUTH EVERY DAY   ketoconazole (NIZORAL) 2 % cream Apply 1 Application topically daily.   meclizine (ANTIVERT) 25 MG tablet Take 1 tablet (25 mg total) by mouth 3 (three) times daily as needed for dizziness. Caution of sedation   montelukast (SINGULAIR) 10 MG tablet TAKE 1 TABLET BY MOUTH EVERYDAY AT BEDTIME   pantoprazole (PROTONIX) 40 MG tablet Take 1 tablet (40 mg total) by mouth daily. Take 30-60 min before first meal of the day   Probiotic Product (PROBIOTIC PO) Take 1 tablet by mouth daily.   Semaglutide, 1 MG/DOSE, 4 MG/3ML SOPN Inject 2 mg as directed once a week.   sodium chloride (OCEAN) 0.65 % SOLN nasal spray Place 1 spray into both nostrils as needed for congestion.   triamcinolone cream (KENALOG) 0.5 % Apply 1 Application topically 2 (two) times daily.          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 Dr Stevphen [place]  - Maybe trees > did not rec shots per pt  Chronic cough  - onset 2005 > resolved on ICS September 10, 2009  - Allergy profile sent Jul 08, 2009 >> neg  - Sinus Ct ordered Jul 08, 2009 >> neg  - Qvar trial Jul 08, 2009 >> much better July 30, 2009 with 90% effective hfa - flared on ACEi April 2016 > resolved at f/u off acei 07/25/14          Objective:   Physical Exam   wts   09/25/2022         ***  07/02/2022         275  06/05/2022       272 09/15/2021       260   03/17/2021       306  07/11/2014       289   > 07/25/2014 288 >  07/30/2014 286     09/02/12 298 lb (135.172 kg)  03/14/12 284 lb (128.822 kg)  02/03/12 281 lb (127.461 kg)    Vital signs reviewed  09/25/2022  - Note at rest 02 sats  94% on RA   General appearance:    mod obese  pleasant amb wm nad   Nl exam           Assessment:

## 2022-09-25 ENCOUNTER — Ambulatory Visit: Payer: 59 | Admitting: Internal Medicine

## 2022-09-25 ENCOUNTER — Encounter: Payer: Self-pay | Admitting: Internal Medicine

## 2022-09-25 VITALS — BP 128/72 | HR 84 | Ht 71.0 in | Wt 281.0 lb

## 2022-09-25 DIAGNOSIS — J45991 Cough variant asthma: Secondary | ICD-10-CM

## 2022-09-25 MED ORDER — HYDROCODONE BIT-HOMATROP MBR 5-1.5 MG/5ML PO SOLN: 5.0000 mL | Freq: Four times a day (QID) | ORAL | 0 refills | Status: DC | PRN

## 2022-09-25 MED ORDER — BENZONATATE 200 MG PO CAPS: 200.0000 mg | ORAL_CAPSULE | Freq: Three times a day (TID) | ORAL | 11 refills | Status: DC | PRN

## 2022-09-25 NOTE — Patient Instructions (Addendum)
For cough/ congestion >  Mucinex dm  up to maximum of  1200 mg every 12 hours and use the flutter valve as much as you can  and supplement with Tessalon 200 mg every 4-6 hours as needed and hydromet is last resort   Ok to try off symbicort  80 and use it for flares up 2 puffs every 12 hours sa needed    Please schedule a follow up visit in 3 months but call sooner if needed  with all medications /inhalers/ solutions in hand so we can verify exactly what you are taking. This includes all medications from all doctors and over the counters

## 2022-09-26 NOTE — Assessment & Plan Note (Addendum)
0nset around 2005 / never smoker> allergy eval by [place] neg x [facility]  - 07/11/2014 p extensive coaching HFA effectiveness =    90% rec try dulera 100 2bid - 07/26/14 cough resolved off acei > retry qvar 80 bid  - 07/31/14 > cough flared so rec dulera 100 2bid  - Sinus CT 08/10/2014 > Minor mucosal thickening in the BILATERAL maxillary sinuses without air-fluid level. - 03/17/2021 changed qvar 80 to dulera 200 2bid due to increase in freq of flares - 09/15/2021    rec symb 160 up to 2bid prn flare  - 06/05/2022 reduced symbiocrt to 80 2bid and max gerd rx for flare -06/05/2022 added flutter valve - 07/02/2022 reported satisfied with control so leave on symb 80 as maint plus gerd rx and add prn 1st gen H1 blockers per guidelines  f/u in 3 m - 09/25/2022  After extensive coaching inhaler device,  effectiveness =    90%   I think the asthmatic component is very mild and the UACS component stronger which means the ICS may do more harm than good if used 2bid so fine with me to try symbicort 80 prn Based on two studies from NEJM  378; 20 p 1865 (2018) and 380 : p2020-30 (2019) in pts with mild asthma it is reasonable to use low dose symbicort eg 80 2bid "prn" flare in this setting but I emphasized this was only shown with symbicort and takes advantage of the rapid onset of action but is not the same as "rescue therapy" but can be stopped once the acute symptoms have resolved and the need for rescue has been minimized (< 2 x weekly)    For the recurrent cyclical cough : Of the three most common causes of  Sub-acute / recurrent or chronic cough, only one (GERD)  can actually contribute to/ trigger  the other two (asthma and post nasal drip syndrome)  and perpetuate the cylce of cough.  While not intuitively obvious, many patients with chronic low grade reflux do not cough until there is a primary insult that disturbs the protective epithelial barrier and exposes sensitive nerve endings.   This is typically viral but can  due to PNDS and  either may apply here.    >>>The point is that once this occurs, it is difficult to eliminate the cycle  using anything but a maximally effective acid suppression regimen at least in the short run, accompanied by an appropriate diet to address non acid GERD and control / eliminate the cough itself for at least 3 days with mucinex dm max dose/ flutter/ supplement with tessalon 200 and as last resort hydromet with all refill requiring a return ov to me or one or our NPs          Each maintenance medication was reviewed in detail including emphasizing most importantly the difference between maintenance and prns and under what circumstances the prns are to be triggered using an action plan format where appropriate.  Total time for H and P, chart review, counseling, reviewing hfa/flutter device(s) and generating customized AVS unique to this office visit / same day charting = 31 min

## 2022-09-28 ENCOUNTER — Encounter: Payer: Self-pay | Admitting: *Deleted

## 2022-09-28 ENCOUNTER — Encounter: Payer: Self-pay | Admitting: Family Medicine

## 2022-09-28 ENCOUNTER — Ambulatory Visit (INDEPENDENT_AMBULATORY_CARE_PROVIDER_SITE_OTHER): Payer: 59 | Admitting: Family Medicine

## 2022-09-28 VITALS — BP 118/62 | HR 78 | Temp 97.6°F | Ht 71.0 in | Wt 276.2 lb

## 2022-09-28 DIAGNOSIS — H00039 Abscess of eyelid unspecified eye, unspecified eyelid: Secondary | ICD-10-CM | POA: Insufficient documentation

## 2022-09-28 MED ORDER — TRAMADOL HCL 50 MG PO TABS
50.0000 mg | ORAL_TABLET | Freq: Two times a day (BID) | ORAL | 0 refills | Status: AC | PRN
Start: 2022-09-28 — End: 2022-10-03

## 2022-09-28 MED ORDER — AMOXICILLIN-POT CLAVULANATE 875-125 MG PO TABS
1.0000 | ORAL_TABLET | Freq: Two times a day (BID) | ORAL | 0 refills | Status: DC
Start: 2022-09-28 — End: 2022-10-02

## 2022-09-28 NOTE — Assessment & Plan Note (Signed)
Right eyelid = swollen /red and very painful  May have started with chalazion  No pain with eye movement and no conjunctival change  He does also have inflamed papule in right nostril and a small area of erythema and induration on left post neck ( ? Insect bite) -unsure if related No signs of zoster  Suspect pre septal cellulitis but want to rule out other  Prescription augmentin sent  Continue cleaning / warm compresses  Call back and Er precautions noted in detail today  - for worse pain/vision change/ fever Urgent oph referral done

## 2022-09-28 NOTE — Progress Notes (Signed)
Subjective:    Patient ID: Richard Giles, male    DOB: [birth year], 62 y.o.   MRN: [id number]  HPI  Wt Readings from Last 3 Encounters:  09/28/22 276 lb 4 oz (125.3 kg)  09/25/22 281 lb (127.5 kg)  08/18/22 277 lb 3.2 oz (125.7 kg)   38.53 kg/m  Vitals:   09/28/22 1224  BP: 118/62  Pulse: 78  Temp: 97.6 F (36.4 C)  SpO2: 97%    62 yo pt of Dr Ermalene Searing presents with right eye swelling   He has history of CKD and environmenal allergies and DM2  Started with a stye ?  on right eyelid he thinks -Friday   Also had a sore ear last week/better now  Nose is sore (? Pimple in nostril)  Top lip os sore to the touch   Last night was sore   Used warm compresses  Used ocu soft wipe (antibacterial)  Washed lid with baby shampoo  Taking tylenol   Normally wears contact lenses He took them out   Some drainage ? Due to pressure     Eyeball itself feels ok  Able to move it  A bit more sensitive to light    Has had shingles before and had immunizations also   No fever  Last night felt cold but temp was under 98    Patient Active Problem List   Diagnosis Date Noted   Cellulitis of eyelid 09/28/2022   BPV (benign positional vertigo) 08/18/2022   Moderate persistent asthma with exacerbation 11/19/2020   CKD (chronic kidney disease), stage III (HCC) 02/20/2018   Dysplastic nevus of trunk 02/04/2018    Class: History of   RLS (restless legs syndrome) 12/17/2015   Class 2 severe obesity due to excess calories with serious comorbidity and body mass index (BMI) of 35.0 to 35.9 in adult (HCC) 12/17/2015   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 Stevphen [place] : Maybe trees Did not receive shots per patient   Chronic bronchitis (HCC)    Chronic cough onset 2005/ resolved on ICS July 19,2011   Sinus CT negative 07/08/2009 negative   Chronic kidney disease    per pt 01-04-2015--autoimmune disease per pt   COPD (chronic obstructive pulmonary disease) (HCC)    per pt 01-04-15   Diabetes mellitus without complication (HCC)    Dysplastic nevus of trunk 02/04/2018   GERD (gastroesophageal reflux disease)    Hyperlipidemia    Hypertension    Restless leg syndrome    Sleep apnea    does not have to use cpap   Past Surgical History:  Procedure Laterality Date   bialteral inguinal hernia  1967   COLONOSCOPY  01/14/2015   FOOT SURGERY  09/03/2003   Left foot osteophye removal (Dr. August Saucer)   POLYPECTOMY     SEPTOPLASTY  1983   at 61 years of age   Social History   Tobacco Use   Smoking status: Never   Smokeless tobacco: Former  Quit date: 02/23/1993  Vaping Use   Vaping status: Never Used  Substance Use Topics   Alcohol use: No   Drug use: No   Family History  Problem Relation Age of Onset   Hypertension Father    Colon polyps Father    Diabetes Maternal Grandmother    Parkinson's disease Mother    Depression Neg Hx    Drug abuse Neg Hx    Stroke Neg Hx    Colon cancer Neg Hx    Stomach cancer Neg Hx    Esophageal cancer Neg Hx    Rectal cancer Neg Hx    Allergies  Allergen Reactions   Bee Venom Anaphylaxis   Hornet Venom Anaphylaxis   Lisinopril Cough    Other reaction(s): Cough Other reaction(s): Cough   Codeine Diarrhea, Nausea And Vomiting and Rash   Current Outpatient Medications on File Prior to Visit  Medication Sig Dispense Refill   ACCU-CHEK GUIDE test strip TEST BLOOD SUGAR EVERY DAY 100 strip 3   Accu-Chek Softclix Lancets lancets  TEST BLOOD SUGAR ONCE DAILY     acetaminophen (TYLENOL) 500 MG tablet Take 1,000 mg by mouth every 6 (six) hours as needed (pain).     albuterol (PROVENTIL) (2.5 MG/3ML) 0.083% nebulizer solution Take 3 mLs (2.5 mg total) by nebulization every 6 (six) hours as needed for wheezing or shortness of breath. 150 mL 1   albuterol (VENTOLIN HFA) 108 (90 Base) MCG/ACT inhaler Inhale 2 puffs into the lungs every 4 (four) hours as needed for wheezing or shortness of breath. 1 each 3   allopurinol (ZYLOPRIM) 300 MG tablet TAKE 1 TABLET BY MOUTH EVERY DAY 90 tablet 3   aspirin 81 MG tablet Take 81 mg by mouth daily.     atorvastatin (LIPITOR) 40 MG tablet TAKE 1 TABLET BY MOUTH EVERY DAY 90 tablet 1   benzonatate (TESSALON) 200 MG capsule Take 1 capsule (200 mg total) by mouth 3 (three) times daily as needed for cough. 30 capsule 11   Blood Glucose Monitoring Suppl (ACCU-CHEK GUIDE ME) w/Device KIT daily.     budesonide-formoterol (SYMBICORT) 80-4.5 MCG/ACT inhaler Take 2 puffs first thing in am and then another 2 puffs about 12 hours later.Take 2 puffs first thing in am and then another 2 puffs about 12 hours later. 1 each 11   cetirizine (ZYRTEC) 10 MG tablet Take 1 tablet (10 mg total) by mouth daily. 14 tablet 0   Cholecalciferol (VITAMIN D3 PO) Take 1 tablet by mouth daily.     cyclobenzaprine (FLEXERIL) 10 MG tablet Take 1 tablet (10 mg total) by mouth 3 (three) times daily as needed for muscle spasms. 20 tablet 0   empagliflozin (JARDIANCE) 25 MG TABS tablet Take 1 tablet (25 mg total) by mouth daily before breakfast. 30 tablet 11   EPINEPHrine 0.3 mg/0.3 mL IJ SOAJ injection Inject 0.3 mg into the muscle as needed for anaphylaxis. 1 each 1   famotidine (PEPCID) 20 MG tablet One after supper 30 tablet 11   fexofenadine (ALLEGRA) 180 MG tablet Take 180 mg by mouth daily.      FLUoxetine (PROZAC) 20 MG capsule TAKE 1 CAPSULE BY MOUTH EVERY DAY 90 capsule 1   fluticasone (FLONASE) 50 MCG/ACT nasal spray  SPRAY 2 SPRAYS INTO EACH NOSTRIL EVERY DAY 48 mL 0   furosemide (LASIX) 20 MG tablet TAKE 1 TABLET BY MOUTH EVERY DAY AS NEEDED 90 tablet 1   HYDROcodone bit-homatropine (HYCODAN) 5-1.5 MG/5ML syrup Take 5 mLs  by mouth every 6 (six) hours as needed for cough. 240 mL 0   irbesartan (AVAPRO) 150 MG tablet TAKE 1 TABLET BY MOUTH EVERY DAY 30 tablet 11   ketoconazole (NIZORAL) 2 % cream Apply 1 Application topically daily. 15 g 0   meclizine (ANTIVERT) 25 MG tablet Take 1 tablet (25 mg total) by mouth 3 (three) times daily as needed for dizziness. Caution of sedation 30 tablet 0   montelukast (SINGULAIR) 10 MG tablet TAKE 1 TABLET BY MOUTH EVERYDAY AT BEDTIME 90 tablet 1   pantoprazole (PROTONIX) 40 MG tablet Take 1 tablet (40 mg total) by mouth daily. Take 30-60 min before first meal of the day 30 tablet 2   Probiotic Product (PROBIOTIC PO) Take 1 tablet by mouth daily.     Semaglutide, 1 MG/DOSE, 4 MG/3ML SOPN Inject 2 mg as directed once a week. 6 mL 3   sodium chloride (OCEAN) 0.65 % SOLN nasal spray Place 1 spray into both nostrils as needed for congestion. 15 mL 0   triamcinolone cream (KENALOG) 0.5 % Apply 1 Application topically 2 (two) times daily. 30 g 0   No current facility-administered medications on file prior to visit.    Review of Systems  Constitutional:  Negative for activity change, appetite change, fatigue, fever and unexpected weight change.  HENT:  Negative for congestion, rhinorrhea, sore throat and trouble swallowing.        Right nostril is painful  Eyes:  Positive for photophobia. Negative for pain, redness, itching and visual disturbance.       Right eyelid pain and swelling  Some tearing   Respiratory:  Negative for cough, chest tightness, shortness of breath and wheezing.   Cardiovascular:  Negative for chest pain and palpitations.  Gastrointestinal:  Negative for abdominal pain, blood in stool, constipation, diarrhea and nausea.  Endocrine: Negative for cold  intolerance, heat intolerance, polydipsia and polyuria.  Genitourinary:  Negative for difficulty urinating, dysuria, frequency and urgency.  Musculoskeletal:  Negative for arthralgias, joint swelling and myalgias.  Skin:  Negative for pallor and rash.  Neurological:  Negative for dizziness, tremors, weakness, numbness and headaches.  Hematological:  Negative for adenopathy. Does not bruise/bleed easily.  Psychiatric/Behavioral:  Negative for decreased concentration and dysphoric mood. The patient is not nervous/anxious.        Objective:   Physical Exam Constitutional:      General: He is not in acute distress.    Appearance: Normal appearance. He is obese. He is not ill-appearing or diaphoretic.  HENT:     Head: Normocephalic and atraumatic.     Right Ear: Tympanic membrane, ear canal and external ear normal.     Left Ear: Tympanic membrane, ear canal and external ear normal.     Nose:     Comments: Small papule in superior right nostril    Mouth/Throat:     Pharynx: Oropharynx is clear.  Eyes:     Conjunctiva/sclera: Conjunctivae normal.     Comments: Right eye Very swollen upper lid/ erythema and tenderness No active drainage No fb noted  EOMs normal  Some tearing  Vision grossly wnl   Cardiovascular:     Rate and Rhythm: Normal rate and regular rhythm.  Pulmonary:     Effort: Pulmonary effort is normal. No respiratory distress.  Musculoskeletal:     Cervical back: Neck supple.  Lymphadenopathy:     Cervical: No cervical adenopathy.  Skin:    Findings: Erythema present. No rash.  Comments: 1.5 cm area of erythema and induration with scab on left posterior neck Non tender  Not fluctuant   Right upper eyelid is erythematous   Neurological:     Mental Status: He is alert.     Cranial Nerves: No cranial nerve deficit.  Psychiatric:        Mood and Affect: Mood normal.           Assessment & Plan:   Problem List Items Addressed This Visit       Other    Cellulitis of eyelid - Primary    Right eyelid = swollen /red and very painful  May have started with chalazion  No pain with eye movement and no conjunctival change  He does also have inflamed papule in right nostril and a small area of erythema and induration on left post neck ( ? Insect bite) -unsure if related No signs of zoster  Suspect pre septal cellulitis but want to rule out other  Prescription augmentin sent  Continue cleaning / warm compresses  Call back and Er precautions noted in detail today  - for worse pain/vision change/ fever Urgent oph referral done         Relevant Orders   Ambulatory referral to Ophthalmology

## 2022-09-28 NOTE — Patient Instructions (Signed)
Keep inflammed area clean  Baby shampoo Warm compresses  Avoid contact lenses   Take the generic augmentin as directed   If worse- go to the ER  Especially if vision change   I placed an urgent ophthalmology referral

## 2022-10-01 ENCOUNTER — Encounter: Payer: Self-pay | Admitting: Family Medicine

## 2022-10-01 ENCOUNTER — Other Ambulatory Visit: Payer: Self-pay | Admitting: Family Medicine

## 2022-10-01 ENCOUNTER — Ambulatory Visit (INDEPENDENT_AMBULATORY_CARE_PROVIDER_SITE_OTHER): Payer: 59 | Admitting: Family Medicine

## 2022-10-01 VITALS — BP 126/80 | HR 87 | Temp 97.4°F | Ht 71.0 in | Wt 274.1 lb

## 2022-10-01 DIAGNOSIS — T7840XD Allergy, unspecified, subsequent encounter: Secondary | ICD-10-CM

## 2022-10-01 DIAGNOSIS — L989 Disorder of the skin and subcutaneous tissue, unspecified: Secondary | ICD-10-CM | POA: Diagnosis not present

## 2022-10-01 DIAGNOSIS — H00039 Abscess of eyelid unspecified eye, unspecified eyelid: Secondary | ICD-10-CM | POA: Diagnosis not present

## 2022-10-01 MED ORDER — SEMAGLUTIDE (1 MG/DOSE) 4 MG/3ML ~~LOC~~ SOPN: 2.0000 mg | PEN_INJECTOR | SUBCUTANEOUS | 3 refills | Status: DC

## 2022-10-01 MED ORDER — METHYLPREDNISOLONE ACETATE 80 MG/ML IJ SUSP
80.0000 mg | Freq: Once | INTRAMUSCULAR | Status: AC
Start: 2022-10-01 — End: 2022-10-01
  Administered 2022-10-01: 80 mg via INTRAMUSCULAR

## 2022-10-01 MED ORDER — PREDNISONE 20 MG PO TABS: ORAL_TABLET | ORAL | 0 refills | Status: DC

## 2022-10-01 MED ORDER — CEFTRIAXONE SODIUM 1 G IJ SOLR
1.0000 g | Freq: Once | INTRAMUSCULAR | Status: AC
Start: 2022-10-01 — End: 2022-10-01
  Administered 2022-10-01: 1 g via INTRAMUSCULAR

## 2022-10-01 NOTE — Telephone Encounter (Signed)
Alternative Requested:THIS NEEDS TO BE WRITTEN FOR THE 2MG  PEN.

## 2022-10-01 NOTE — Progress Notes (Signed)
After obtaining consent, and per orders of Dr. Ermalene Searing, injection of Ceftriaxone 1g (left thigh) and Methylprednisone  80mg  (right thigh) given by Valentino Nose. Patient instructed to remain in clinic for 20 minutes afterwards, and to report any adverse reaction to me immediately. Patient tolerated injection well.

## 2022-10-01 NOTE — Progress Notes (Signed)
Patient ID: Richard Giles, male    DOB: Dec 07, 1960, 62 y.o.   MRN: [id number]  This visit was conducted in person.  BP 126/80   Pulse 87   Temp (!) 97.4 F (36.3 C) (Temporal)   Ht 5\' 11"  (1.803 m)   Wt 274 lb 2 oz (124.3 kg)   SpO2 95%   BMI 38.23 kg/m    CC:  Chief Complaint  Patient presents with   Cellulitis    Here for cellulitis of R eyelid f/u. Now, c/o upper lip swelling and pain and in anterior throat.     Subjective:   HPI: LENDELL Giles is a 62 y.o. male presenting on 10/01/2022 for Cellulitis (Here for cellulitis of R eyelid f/u. Now, c/o upper lip swelling and pain and in anterior throat. )   Reviewed recent office visit note for cellulitis of right eyelid. 09/28/2022 Dr. Milinda Antis. Started on Augmentin.  Recommended warm compresses.  Vicodin for pain Referred urgently to ophthalmology.  In last 2 days redness and spreading to upper lip and nose  and right upper face.  Now glands are sore  in neck.   Ophthalmologist felt possible allergic reaction to Augmentin.... noted more facial swelling after starting almost immediately.   Started on doxycycline 100 mg BID .Marland Kitchen Has 2 days worth.. has noted less swelling in eye.   No fever, no chills.   No SOB, no trouble swallowing. He has history of diabetes, well-controlled Lab Results  Component Value Date   HGBA1C 6.1 (A) 05/26/2022        Relevant past medical, surgical, family and social history reviewed and updated as indicated. Interim medical history since our last visit reviewed. Allergies and medications reviewed and updated. Outpatient Medications Prior to Visit  Medication Sig Dispense Refill   ACCU-CHEK GUIDE test strip TEST BLOOD SUGAR EVERY DAY 100 strip 3   Accu-Chek Softclix Lancets lancets TEST BLOOD SUGAR ONCE DAILY     acetaminophen (TYLENOL) 500 MG tablet Take 1,000 mg by mouth every 6 (six) hours as needed (pain).     albuterol (PROVENTIL) (2.5 MG/3ML) 0.083% nebulizer solution Take 3 mLs (2.5 mg  total) by nebulization every 6 (six) hours as needed for wheezing or shortness of breath. 150 mL 1   albuterol (VENTOLIN HFA) 108 (90 Base) MCG/ACT inhaler Inhale 2 puffs into the lungs every 4 (four) hours as needed for wheezing or shortness of breath. 1 each 3   allopurinol (ZYLOPRIM) 300 MG tablet TAKE 1 TABLET BY MOUTH EVERY DAY 90 tablet 3   amoxicillin-clavulanate (AUGMENTIN) 875-125 MG tablet Take 1 tablet by mouth 2 (two) times daily. 14 tablet 0   aspirin 81 MG tablet Take 81 mg by mouth daily.     atorvastatin (LIPITOR) 40 MG tablet TAKE 1 TABLET BY MOUTH EVERY DAY 90 tablet 1   benzonatate (TESSALON) 200 MG capsule Take 1 capsule (200 mg total) by mouth 3 (three) times daily as needed for cough. 30 capsule 11   Blood Glucose Monitoring Suppl (ACCU-CHEK GUIDE ME) w/Device KIT daily.     budesonide-formoterol (SYMBICORT) 80-4.5 MCG/ACT inhaler Take 2 puffs first thing in am and then another 2 puffs about 12 hours later.Take 2 puffs first thing in am and then another 2 puffs about 12 hours later. 1 each 11   cetirizine (ZYRTEC) 10 MG tablet Take 1 tablet (10 mg total) by mouth daily. 14 tablet 0   Cholecalciferol (VITAMIN D3 PO) Take 1 tablet by mouth daily.  cyclobenzaprine (FLEXERIL) 10 MG tablet Take 1 tablet (10 mg total) by mouth 3 (three) times daily as needed for muscle spasms. 20 tablet 0   empagliflozin (JARDIANCE) 25 MG TABS tablet Take 1 tablet (25 mg total) by mouth daily before breakfast. 30 tablet 11   EPINEPHrine 0.3 mg/0.3 mL IJ SOAJ injection Inject 0.3 mg into the muscle as needed for anaphylaxis. 1 each 1   famotidine (PEPCID) 20 MG tablet One after supper 30 tablet 11   fexofenadine (ALLEGRA) 180 MG tablet Take 180 mg by mouth daily.      FLUoxetine (PROZAC) 20 MG capsule TAKE 1 CAPSULE BY MOUTH EVERY DAY 90 capsule 1   fluticasone (FLONASE) 50 MCG/ACT nasal spray SPRAY 2 SPRAYS INTO EACH NOSTRIL EVERY DAY 48 mL 0   furosemide (LASIX) 20 MG tablet TAKE 1 TABLET BY  MOUTH EVERY DAY AS NEEDED 90 tablet 1   HYDROcodone bit-homatropine (HYCODAN) 5-1.5 MG/5ML syrup Take 5 mLs by mouth every 6 (six) hours as needed for cough. 240 mL 0   irbesartan (AVAPRO) 150 MG tablet TAKE 1 TABLET BY MOUTH EVERY DAY 30 tablet 11   ketoconazole (NIZORAL) 2 % cream Apply 1 Application topically daily. 15 g 0   meclizine (ANTIVERT) 25 MG tablet Take 1 tablet (25 mg total) by mouth 3 (three) times daily as needed for dizziness. Caution of sedation 30 tablet 0   montelukast (SINGULAIR) 10 MG tablet TAKE 1 TABLET BY MOUTH EVERYDAY AT BEDTIME 90 tablet 1   pantoprazole (PROTONIX) 40 MG tablet Take 1 tablet (40 mg total) by mouth daily. Take 30-60 min before first meal of the day 30 tablet 2   Probiotic Product (PROBIOTIC PO) Take 1 tablet by mouth daily.     sodium chloride (OCEAN) 0.65 % SOLN nasal spray Place 1 spray into both nostrils as needed for congestion. 15 mL 0   traMADol (ULTRAM) 50 MG tablet Take 1 tablet (50 mg total) by mouth every 12 (twelve) hours as needed for up to 5 days for severe pain. 10 tablet 0   triamcinolone cream (KENALOG) 0.5 % Apply 1 Application topically 2 (two) times daily. 30 g 0   Semaglutide, 1 MG/DOSE, 4 MG/3ML SOPN Inject 2 mg as directed once a week. 6 mL 3   No facility-administered medications prior to visit.     Per HPI unless specifically indicated in ROS section below Review of Systems  Constitutional:  Negative for fatigue and fever.  HENT:  Positive for facial swelling. Negative for ear pain.   Eyes:  Negative for pain.  Respiratory:  Negative for cough and shortness of breath.   Cardiovascular:  Negative for chest pain, palpitations and leg swelling.  Gastrointestinal:  Negative for abdominal pain.  Genitourinary:  Negative for dysuria.  Musculoskeletal:  Negative for arthralgias.  Neurological:  Negative for syncope, light-headedness and headaches.  Psychiatric/Behavioral:  Negative for dysphoric mood.    Objective:  BP 126/80    Pulse 87   Temp (!) 97.4 F (36.3 C) (Temporal)   Ht 5\' 11"  (1.803 m)   Wt 274 lb 2 oz (124.3 kg)   SpO2 95%   BMI 38.23 kg/m   Wt Readings from Last 3 Encounters:  10/01/22 274 lb 2 oz (124.3 kg)  09/28/22 276 lb 4 oz (125.3 kg)  09/25/22 281 lb (127.5 kg)      Physical Exam Constitutional:      Appearance: He is well-developed. He is not ill-appearing.  HENT:  Head: Normocephalic. Right periorbital erythema present.     Jaw: Swelling present.     Right Ear: Hearing, tympanic membrane, ear canal and external ear normal.     Left Ear: Hearing, tympanic membrane, ear canal and external ear normal.     Nose: Nose normal. No congestion.     Comments: Nasal swelling and swelling of upper lip. See photos Neck:     Thyroid: No thyroid mass or thyromegaly.     Vascular: No carotid bruit.     Trachea: Trachea normal.  Cardiovascular:     Rate and Rhythm: Normal rate and regular rhythm.     Pulses: Normal pulses.     Heart sounds: Heart sounds not distant. No murmur heard.    No friction rub. No gallop.     Comments: No peripheral edema Pulmonary:     Effort: Pulmonary effort is normal. No respiratory distress.     Breath sounds: Normal breath sounds.  Skin:    General: Skin is warm and dry.     Findings: Lesion present. No rash.     Comments: Left posterior neck.  Neurological:     Mental Status: He is alert.  Psychiatric:        Speech: Speech normal.        Behavior: Behavior normal.        Thought Content: Thought content normal.            Results for orders placed or performed in visit on 05/26/22  HM DIABETES EYE EXAM  Result Value Ref Range   HM Diabetic Eye Exam No Retinopathy No Retinopathy    Assessment and Plan  Cellulitis of eyelid -     methylPREDNISolone Acetate -     cefTRIAXone Sodium  Allergic reaction, subsequent encounter -     methylPREDNISolone Acetate -     cefTRIAXone Sodium  Skin lesion of neck -     WOUND CULTURE  Other  orders -     predniSONE; 3 tabs by mouth daily x 3 days, then 2 tabs by mouth daily x 2 days then 1 tab by mouth daily x 2 days  Dispense: 15 tablet; Refill: 0 -     Semaglutide (1 MG/DOSE); Inject 2 mg as directed once a week.  Dispense: 6 mL; Refill: 3   Concerning for spread of cellulitis from right eyelid.  Possible MRSA especially given additional skin lesion on posterior neck and possibly in right ear canal. On doxycycline 100 mg p.o. twice daily which will cover MRSA. Will culture skin lesion on neck.  Given new onset upper lip swelling and oral swelling without shortness of breath, will treat with Depo-Medrol 80 mg x 1.  Will start prednisone taper tomorrow.  Some concern for possible allergic reaction to Augmentin so patient has held.  He has tolerated amoxicillin in the past.  Give a ceftriaxone injection 1 g x 1 today in office.  Hold ARB given possible allergic reaction/angioedema.  Given diabetes, recommended following blood sugars closely.  Refilled semaglutide.   Pt does have epi pen available as bee sting history. Close follow-up tomorrow.  Return and ER precautions provided.  Return in about 1 day (around 10/02/2022) for  follow up cellultitis.Marland Kitchen okay to add on/double book if needed.Kerby Nora, MD

## 2022-10-01 NOTE — Patient Instructions (Addendum)
Continue warm compresses.  Hold irbesartan temporarily.  Start prednisone tomorrow... what blood sugars closely.  Continue doxycycline 100 mg  twice daily. Will

## 2022-10-02 ENCOUNTER — Encounter: Payer: Self-pay | Admitting: Family Medicine

## 2022-10-02 ENCOUNTER — Ambulatory Visit (INDEPENDENT_AMBULATORY_CARE_PROVIDER_SITE_OTHER): Payer: 59 | Admitting: Family Medicine

## 2022-10-02 VITALS — BP 110/68 | HR 95 | Temp 97.7°F | Ht 71.0 in | Wt 280.2 lb

## 2022-10-02 DIAGNOSIS — H00039 Abscess of eyelid unspecified eye, unspecified eyelid: Secondary | ICD-10-CM

## 2022-10-02 NOTE — Progress Notes (Signed)
Patient ID: Richard Giles, male    DOB: [birth year], 62 y.o.   MRN: [id number]  This visit was conducted in person.  BP 110/68 (BP Location: Left Arm, Patient Position: Sitting, Cuff Size: Large)   Pulse 95   Temp 97.7 F (36.5 C) (Temporal)   Ht 5\' 11"  (1.803 m)   Wt 280 lb 4 oz (127.1 kg)   SpO2 96%   BMI 39.09 kg/m    CC:  Chief Complaint  Patient presents with   Cellulitis    Follow up cellulitis of eyelid    Subjective:   HPI: Richard Giles is a 62 y.o. male presenting on 10/02/2022 for Cellulitis (Follow up cellulitis of eyelid)  1 day follow-up following worsening cellulitis of right eyelid with possible associated allergic reaction from Augmentin.  Yesterday he was given an injection of Depo-Medrol 80 mg x 1.  He was also given ceftriaxone 1 g to broaden antibiotic coverage while recommended continuing doxycycline course for full 10 days. Prednisone taper was also provided for him to start this morning but he actually started it prior to bedtime last night. He did have a follow-up appoint with his ophthalmologist today who felt he was improving and recommended continuation of the doxycycline.  In office today he reports significant decrease in swelling in his eye nose right face and upper lip.  No shortness of breath no tongue swelling or oral swelling. No fever. He is tolerating the antibiotics well.      Relevant past medical, surgical, family and social history reviewed and updated as indicated. Interim medical history since our last visit reviewed. Allergies and medications reviewed and updated. Outpatient Medications Prior to Visit  Medication Sig Dispense Refill   ACCU-CHEK GUIDE test strip TEST BLOOD SUGAR EVERY DAY 100 strip 3   Accu-Chek Softclix Lancets lancets TEST BLOOD SUGAR ONCE DAILY     acetaminophen (TYLENOL) 500 MG tablet Take 1,000 mg by mouth every 6 (six) hours as needed (pain).     albuterol (PROVENTIL) (2.5 MG/3ML) 0.083% nebulizer solution Take  3 mLs (2.5 mg total) by nebulization every 6 (six) hours as needed for wheezing or shortness of breath. 150 mL 1   albuterol (VENTOLIN HFA) 108 (90 Base) MCG/ACT inhaler Inhale 2 puffs into the lungs every 4 (four) hours as needed for wheezing or shortness of breath. 1 each 3   allopurinol (ZYLOPRIM) 300 MG tablet TAKE 1 TABLET BY MOUTH EVERY DAY 90 tablet 3   aspirin 81 MG tablet Take 81 mg by mouth daily.     atorvastatin (LIPITOR) 40 MG tablet TAKE 1 TABLET BY MOUTH EVERY DAY 90 tablet 1   benzonatate (TESSALON) 200 MG capsule Take 1 capsule (200 mg total) by mouth 3 (three) times daily as needed for cough. 30 capsule 11   Blood Glucose Monitoring Suppl (ACCU-CHEK GUIDE ME) w/Device KIT daily.     budesonide-formoterol (SYMBICORT) 80-4.5 MCG/ACT inhaler Take 2 puffs first thing in am and then another 2 puffs about 12 hours later.Take 2 puffs first thing in am and then another 2 puffs about 12 hours later. 1 each 11   cetirizine (ZYRTEC) 10 MG tablet Take 1 tablet (10 mg total) by mouth daily. 14 tablet 0   Cholecalciferol (VITAMIN D3 PO) Take 1 tablet by mouth daily.     cyclobenzaprine (FLEXERIL) 10 MG tablet Take 1 tablet (10 mg total) by mouth 3 (three) times daily as needed for muscle spasms. 20 tablet 0   empagliflozin (  JARDIANCE) 25 MG TABS tablet Take 1 tablet (25 mg total) by mouth daily before breakfast. 30 tablet 11   EPINEPHrine 0.3 mg/0.3 mL IJ SOAJ injection Inject 0.3 mg into the muscle as needed for anaphylaxis. 1 each 1   famotidine (PEPCID) 20 MG tablet One after supper 30 tablet 11   fexofenadine (ALLEGRA) 180 MG tablet Take 180 mg by mouth daily.      FLUoxetine (PROZAC) 20 MG capsule TAKE 1 CAPSULE BY MOUTH EVERY DAY 90 capsule 1   fluticasone (FLONASE) 50 MCG/ACT nasal spray SPRAY 2 SPRAYS INTO EACH NOSTRIL EVERY DAY 48 mL 0   furosemide (LASIX) 20 MG tablet TAKE 1 TABLET BY MOUTH EVERY DAY AS NEEDED 90 tablet 1   HYDROcodone bit-homatropine (HYCODAN) 5-1.5 MG/5ML syrup Take  5 mLs by mouth every 6 (six) hours as needed for cough. 240 mL 0   irbesartan (AVAPRO) 150 MG tablet TAKE 1 TABLET BY MOUTH EVERY DAY 30 tablet 11   ketoconazole (NIZORAL) 2 % cream Apply 1 Application topically daily. 15 g 0   meclizine (ANTIVERT) 25 MG tablet Take 1 tablet (25 mg total) by mouth 3 (three) times daily as needed for dizziness. Caution of sedation 30 tablet 0   montelukast (SINGULAIR) 10 MG tablet TAKE 1 TABLET BY MOUTH EVERYDAY AT BEDTIME 90 tablet 1   pantoprazole (PROTONIX) 40 MG tablet Take 1 tablet (40 mg total) by mouth daily. Take 30-60 min before first meal of the day 30 tablet 2   predniSONE (DELTASONE) 20 MG tablet 3 tabs by mouth daily x 3 days, then 2 tabs by mouth daily x 2 days then 1 tab by mouth daily x 2 days 15 tablet 0   Probiotic Product (PROBIOTIC PO) Take 1 tablet by mouth daily.     Semaglutide, 2 MG/DOSE, (OZEMPIC, 2 MG/DOSE,) 8 MG/3ML SOPN Inject 2 mg into the skin once a week. 9 mL 3   sodium chloride (OCEAN) 0.65 % SOLN nasal spray Place 1 spray into both nostrils as needed for congestion. 15 mL 0   traMADol (ULTRAM) 50 MG tablet Take 1 tablet (50 mg total) by mouth every 12 (twelve) hours as needed for up to 5 days for severe pain. 10 tablet 0   triamcinolone cream (KENALOG) 0.5 % Apply 1 Application topically 2 (two) times daily. 30 g 0   amoxicillin-clavulanate (AUGMENTIN) 875-125 MG tablet Take 1 tablet by mouth 2 (two) times daily. 14 tablet 0   No facility-administered medications prior to visit.     Per HPI unless specifically indicated in ROS section below Review of Systems  Constitutional:  Negative for fatigue and fever.  HENT:  Negative for ear pain.   Eyes:  Negative for pain.  Respiratory:  Negative for cough and shortness of breath.   Cardiovascular:  Negative for chest pain, palpitations and leg swelling.  Gastrointestinal:  Negative for abdominal pain.  Genitourinary:  Negative for dysuria.  Musculoskeletal:  Negative for  arthralgias.  Neurological:  Negative for syncope, light-headedness and headaches.  Psychiatric/Behavioral:  Negative for dysphoric mood.    Objective:  BP 110/68 (BP Location: Left Arm, Patient Position: Sitting, Cuff Size: Large)   Pulse 95   Temp 97.7 F (36.5 C) (Temporal)   Ht 5\' 11"  (1.803 m)   Wt 280 lb 4 oz (127.1 kg)   SpO2 96%   BMI 39.09 kg/m   Wt Readings from Last 3 Encounters:  10/02/22 280 lb 4 oz (127.1 kg)  10/01/22 274 lb  2 oz (124.3 kg)  09/28/22 276 lb 4 oz (125.3 kg)      Physical Exam Constitutional:      Appearance: He is well-developed.  HENT:     Head: Normocephalic.     Right Ear: Hearing normal.     Left Ear: Hearing normal.     Nose: Nose normal.  Neck:     Thyroid: No thyroid mass or thyromegaly.     Vascular: No carotid bruit.     Trachea: Trachea normal.  Cardiovascular:     Rate and Rhythm: Normal rate and regular rhythm.     Pulses: Normal pulses.     Heart sounds: Heart sounds not distant. No murmur heard.    No friction rub. No gallop.     Comments: No peripheral edema Pulmonary:     Effort: Pulmonary effort is normal. No respiratory distress.     Breath sounds: Normal breath sounds.  Skin:    General: Skin is warm and dry.     Findings: No rash.  Psychiatric:        Speech: Speech normal.        Behavior: Behavior normal.        Thought Content: Thought content normal.       Results for orders placed or performed in visit on 05/26/22  HM DIABETES EYE EXAM  Result Value Ref Range   HM Diabetic Eye Exam No Retinopathy No Retinopathy    Assessment and Plan  Cellulitis of eyelid Assessment & Plan: Acute, significant improvement with treatment with steroids and broadening antibiotic regimen.  At this point in time there is no clear indication for additional ceftriaxone injection.  He will continue doxycycline 100 mg p.o. twice daily for the full 10 days.  We are awaiting wound culture of the area on his neck to return to rule  out MRSA.  Continue prednisone taper for possible allergic involvement.  Remain off of irbesartan given possible associated angioedema.  Did recommend to him to restart Iver Sartain for trial once symptoms are resolved completely.  Return and ER precautions provided.     No follow-ups on file.   Kerby Nora, MD

## 2022-10-02 NOTE — Assessment & Plan Note (Signed)
Acute, significant improvement with treatment with steroids and broadening antibiotic regimen.  At this point in time there is no clear indication for additional ceftriaxone injection.  He will continue doxycycline 100 mg p.o. twice daily for the full 10 days.  We are awaiting wound culture of the area on his neck to return to rule out MRSA.  Continue prednisone taper for possible allergic involvement.  Remain off of irbesartan given possible associated angioedema.  Did recommend to him to restart Iver Sartain for trial once symptoms are resolved completely.  Return and ER precautions provided.

## 2022-10-11 ENCOUNTER — Other Ambulatory Visit: Payer: Self-pay | Admitting: Family Medicine

## 2022-10-15 ENCOUNTER — Other Ambulatory Visit: Payer: Self-pay | Admitting: Family Medicine

## 2022-12-24 ENCOUNTER — Other Ambulatory Visit (HOSPITAL_COMMUNITY): Payer: Self-pay

## 2022-12-28 ENCOUNTER — Ambulatory Visit: Payer: 59 | Admitting: Internal Medicine

## 2023-01-13 ENCOUNTER — Other Ambulatory Visit: Payer: Self-pay | Admitting: Family Medicine

## 2023-01-23 ENCOUNTER — Other Ambulatory Visit: Payer: Self-pay | Admitting: Family Medicine

## 2023-01-24 ENCOUNTER — Encounter: Payer: Self-pay | Admitting: Family Medicine

## 2023-01-25 ENCOUNTER — Other Ambulatory Visit: Payer: Self-pay | Admitting: Family Medicine

## 2023-01-25 NOTE — Telephone Encounter (Signed)
Please schedule CPE with fasting labs prior with Dr. Ermalene Searing after 02/11/2023.

## 2023-01-26 NOTE — Telephone Encounter (Signed)
Spoke with patient. He stated that he will callback later today to schedule.

## 2023-02-06 ENCOUNTER — Other Ambulatory Visit: Payer: Self-pay | Admitting: Family Medicine

## 2023-02-12 ENCOUNTER — Telehealth: Payer: Self-pay | Admitting: *Deleted

## 2023-02-12 ENCOUNTER — Other Ambulatory Visit: Payer: Self-pay | Admitting: Family Medicine

## 2023-02-12 DIAGNOSIS — Z125 Encounter for screening for malignant neoplasm of prostate: Secondary | ICD-10-CM

## 2023-02-12 DIAGNOSIS — M109 Gout, unspecified: Secondary | ICD-10-CM

## 2023-02-12 DIAGNOSIS — E1159 Type 2 diabetes mellitus with other circulatory complications: Secondary | ICD-10-CM

## 2023-02-12 DIAGNOSIS — E1169 Type 2 diabetes mellitus with other specified complication: Secondary | ICD-10-CM

## 2023-02-12 NOTE — Telephone Encounter (Signed)
Copied from CRM [id number]. Topic: Clinical - Request for Lab/Test Order >> Feb 12, 2023  2:43 PM Corin V wrote: Reason for CRM: Patient has physical scheduled 03/04/22. He is wanting to know if he will need labs done before then.

## 2023-02-12 NOTE — Telephone Encounter (Signed)
Spoke with Richard Giles and schedule pre-physical labs.  Future lab orders in Epic.

## 2023-02-21 ENCOUNTER — Other Ambulatory Visit: Payer: Self-pay | Admitting: Family Medicine

## 2023-03-04 ENCOUNTER — Other Ambulatory Visit (INDEPENDENT_AMBULATORY_CARE_PROVIDER_SITE_OTHER): Payer: BC Managed Care – PPO

## 2023-03-04 DIAGNOSIS — E1159 Type 2 diabetes mellitus with other circulatory complications: Secondary | ICD-10-CM

## 2023-03-04 DIAGNOSIS — Z125 Encounter for screening for malignant neoplasm of prostate: Secondary | ICD-10-CM

## 2023-03-04 DIAGNOSIS — E1169 Type 2 diabetes mellitus with other specified complication: Secondary | ICD-10-CM | POA: Diagnosis not present

## 2023-03-04 DIAGNOSIS — M109 Gout, unspecified: Secondary | ICD-10-CM

## 2023-03-04 DIAGNOSIS — E785 Hyperlipidemia, unspecified: Secondary | ICD-10-CM | POA: Diagnosis not present

## 2023-03-04 LAB — COMPREHENSIVE METABOLIC PANEL
ALT: 12 U/L (ref 0–53)
AST: 14 U/L (ref 0–37)
Albumin: 4 g/dL (ref 3.5–5.2)
Alkaline Phosphatase: 126 U/L — ABNORMAL HIGH (ref 39–117)
BUN: 32 mg/dL — ABNORMAL HIGH (ref 6–23)
CO2: 25 meq/L (ref 19–32)
Calcium: 9.2 mg/dL (ref 8.4–10.5)
Chloride: 105 meq/L (ref 96–112)
Creatinine, Ser: 2.22 mg/dL — ABNORMAL HIGH (ref 0.40–1.50)
GFR: 31.06 mL/min — ABNORMAL LOW (ref >60.00)
Glucose, Bld: 117 mg/dL — ABNORMAL HIGH (ref 70–99)
Potassium: 4.5 meq/L (ref 3.5–5.1)
Sodium: 139 meq/L (ref 135–145)
Total Bilirubin: 0.6 mg/dL (ref 0.2–1.2)
Total Protein: 6.3 g/dL (ref 6.0–8.3)

## 2023-03-04 LAB — LIPID PANEL
Cholesterol: 101 mg/dL (ref 0–200)
HDL: 31.7 mg/dL — ABNORMAL LOW (ref >39.00)
LDL Cholesterol: 48 mg/dL (ref 0–99)
NonHDL: 69.51
Total CHOL/HDL Ratio: 3
Triglycerides: 107 mg/dL (ref 0.0–149.0)
VLDL: 21.4 mg/dL (ref 0.0–40.0)

## 2023-03-04 LAB — HEMOGLOBIN A1C: Hgb A1c MFr Bld: 6.3 % (ref 4.6–6.5)

## 2023-03-04 LAB — MICROALBUMIN / CREATININE URINE RATIO
Creatinine,U: 63.9 mg/dL
Microalb Creat Ratio: 1.8 mg/g (ref 0.0–30.0)
Microalb, Ur: 1.2 mg/dL (ref 0.0–1.9)

## 2023-03-04 LAB — URIC ACID: Uric Acid, Serum: 7.9 mg/dL — ABNORMAL HIGH (ref 4.0–7.8)

## 2023-03-04 LAB — PSA: PSA: 0.61 ng/mL (ref 0.10–4.00)

## 2023-03-04 NOTE — Progress Notes (Signed)
 No critical labs need to be addressed urgently. We will discuss labs in detail at upcoming office visit.

## 2023-03-05 ENCOUNTER — Encounter: Payer: BLUE CROSS/BLUE SHIELD | Admitting: Family Medicine

## 2023-03-05 ENCOUNTER — Ambulatory Visit (INDEPENDENT_AMBULATORY_CARE_PROVIDER_SITE_OTHER): Payer: BLUE CROSS/BLUE SHIELD | Admitting: Family Medicine

## 2023-03-05 VITALS — BP 110/60 | HR 90 | Temp 97.4°F | Ht 70.25 in | Wt 278.1 lb

## 2023-03-05 DIAGNOSIS — Z7984 Long term (current) use of oral hypoglycemic drugs: Secondary | ICD-10-CM

## 2023-03-05 DIAGNOSIS — Z0000 Encounter for general adult medical examination without abnormal findings: Secondary | ICD-10-CM

## 2023-03-05 DIAGNOSIS — E785 Hyperlipidemia, unspecified: Secondary | ICD-10-CM

## 2023-03-05 DIAGNOSIS — I152 Hypertension secondary to endocrine disorders: Secondary | ICD-10-CM

## 2023-03-05 DIAGNOSIS — E1159 Type 2 diabetes mellitus with other circulatory complications: Secondary | ICD-10-CM

## 2023-03-05 DIAGNOSIS — N1831 Chronic kidney disease, stage 3a: Secondary | ICD-10-CM

## 2023-03-05 DIAGNOSIS — E66812 Obesity, class 2: Secondary | ICD-10-CM

## 2023-03-05 DIAGNOSIS — M109 Gout, unspecified: Secondary | ICD-10-CM

## 2023-03-05 DIAGNOSIS — E1169 Type 2 diabetes mellitus with other specified complication: Secondary | ICD-10-CM

## 2023-03-05 DIAGNOSIS — Z6835 Body mass index (BMI) 35.0-35.9, adult: Secondary | ICD-10-CM

## 2023-03-05 LAB — HM DIABETES FOOT EXAM

## 2023-03-05 NOTE — Assessment & Plan Note (Signed)
Chronic, well-controlled atorvastatin 40 mg daily

## 2023-03-05 NOTE — Assessment & Plan Note (Signed)
chronic, stable, followed by renal.

## 2023-03-05 NOTE — Progress Notes (Signed)
 Patient ID: Richard Giles, male    DOB: May 22, 1960, 63 y.o.   MRN: [id number]  This visit was conducted in person.  BP 110/60 (BP Location: Left Arm, Patient Position: Sitting, Cuff Size: Large)   Pulse 90   Temp (!) 97.4 F (36.3 C) (Temporal)   Ht 5' 10.25 (1.784 m)   Wt 278 lb 2 oz (126.2 kg)   SpO2 99%   BMI 39.62 kg/m    CC:  Chief Complaint  Patient presents with   Annual Exam    Subjective:   HPI: Richard Giles is a 63 y.o. male presenting on 03/05/2023 for Annual Exam  The patient presents for complete physical and review of chronic health problems. 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  On semaglutide  2 mg weekly    Wt Readings from Last 3 Encounters:  03/05/23 278 lb 2 oz (126.2 kg)  10/02/22 280 lb 4 oz (127.1 kg)  10/01/22 274 lb 2 oz (124.3 kg)  Body mass index is 39.62 kg/m.   Hypertension:  Well-controlled Coreg  6.25 mg p.o. twice daily Irbesartan  500 mg p.o. daily Amlodipine  5 mg daily BP Readings from Last 3 Encounters:  03/05/23 110/60  10/02/22 110/68  10/01/22 126/80  Using medication without problems or lightheadedness:  none Chest pain with exertion: none Edema: none Short of breath:none Average home BPs: Other issues:    No recent gout flares.. on allopurinol  300 mg daily.  Elevated Cholesterol: Well-controlled on atorvastatin  40 mg daily Lab Results  Component Value Date   CHOL 101 03/04/2023   HDL 31.70 (L) 03/04/2023   LDLCALC 48 03/04/2023   LDLDIRECT 85.0 08/12/2020   TRIG 107.0 03/04/2023   CHOLHDL 3 03/04/2023  Using medications without problems: none Muscle aches: none Diet compliance: low carb Exercise:  none.. baseball starting soon Other complaints:  Diabetes: Well-controlled on semaglutide  following weight loss Lab Results  Component Value Date   HGBA1C 6.3 03/04/2023  Using medications without difficulties: Hypoglycemic  episodes: Hyperglycemic episodes: Feet problems: no ulcer Blood Sugars averaging: eye exam within last year: due      Relevant past medical, surgical, family and social history reviewed and updated as indicated. Interim medical history since our last visit reviewed. Allergies and medications reviewed and updated. Outpatient Medications Prior to Visit  Medication Sig Dispense Refill   ACCU-CHEK GUIDE test strip TEST BLOOD SUGAR EVERY DAY 100 strip 3   Accu-Chek Softclix Lancets lancets TEST BLOOD SUGAR ONCE DAILY     acetaminophen  (TYLENOL ) 500 MG tablet Take 1,000 mg by mouth every 6 (six) hours as needed (pain).     albuterol  (PROVENTIL ) (2.5 MG/3ML) 0.083% nebulizer solution Take 3 mLs (2.5 mg total) by nebulization every 6 (six) hours as needed for wheezing or shortness of breath. 150 mL 1   albuterol  (VENTOLIN  HFA) 108 (90 Base) MCG/ACT inhaler Inhale 2 puffs into the lungs every 4 (four) hours as needed for wheezing or shortness of breath. 1 each 3   allopurinol  (ZYLOPRIM ) 300 MG tablet TAKE 1 TABLET BY MOUTH EVERY DAY 90 tablet 3   aspirin 81 MG tablet Take 81 mg by mouth daily.     atorvastatin  (LIPITOR) 40 MG tablet TAKE 1 TABLET BY MOUTH EVERY DAY 90 tablet 0   Blood Glucose Monitoring Suppl (ACCU-CHEK GUIDE ME) w/Device KIT daily.     budesonide -formoterol  (SYMBICORT ) 80-4.5 MCG/ACT inhaler Take 2 puffs first thing in am and then  another 2 puffs about 12 hours later.Take 2 puffs first thing in am and then another 2 puffs about 12 hours later. 1 each 11   cetirizine  (ZYRTEC ) 10 MG tablet Take 1 tablet (10 mg total) by mouth daily. 14 tablet 0   Cholecalciferol (VITAMIN D3 PO) Take 1 tablet by mouth daily.     cyclobenzaprine  (FLEXERIL ) 10 MG tablet Take 1 tablet (10 mg total) by mouth 3 (three) times daily as needed for muscle spasms. 20 tablet 0   empagliflozin  (JARDIANCE ) 25 MG TABS tablet TAKE 1 TABLET BY MOUTH DAILY BEFORE BREAKFAST. (Patient not taking: Reported on 03/05/2023)  90 tablet 0   EPINEPHrine  0.3 mg/0.3 mL IJ SOAJ injection Inject 0.3 mg into the muscle as needed for anaphylaxis. 1 each 1   famotidine  (PEPCID ) 20 MG tablet One after supper 30 tablet 11   fexofenadine (ALLEGRA) 180 MG tablet Take 180 mg by mouth daily.      FLUoxetine  (PROZAC ) 20 MG capsule TAKE 1 CAPSULE BY MOUTH EVERY DAY 90 capsule 0   fluticasone  (FLONASE ) 50 MCG/ACT nasal spray SPRAY 2 SPRAYS INTO EACH NOSTRIL EVERY DAY 48 mL 0   furosemide  (LASIX ) 20 MG tablet TAKE 1 TABLET BY MOUTH EVERY DAY AS NEEDED 90 tablet 0   irbesartan  (AVAPRO ) 150 MG tablet TAKE 1 TABLET BY MOUTH EVERY DAY 90 tablet 3   meclizine  (ANTIVERT ) 25 MG tablet Take 1 tablet (25 mg total) by mouth 3 (three) times daily as needed for dizziness. Caution of sedation 30 tablet 0   montelukast  (SINGULAIR ) 10 MG tablet TAKE 1 TABLET BY MOUTH EVERYDAY AT BEDTIME 90 tablet 0   pantoprazole  (PROTONIX ) 40 MG tablet Take 1 tablet (40 mg total) by mouth daily. Take 30-60 min before first meal of the day 30 tablet 2   Probiotic Product (PROBIOTIC PO) Take 1 tablet by mouth daily.     Semaglutide , 2 MG/DOSE, (OZEMPIC , 2 MG/DOSE,) 8 MG/3ML SOPN Inject 2 mg into the skin once a week. 9 mL 3   benzonatate  (TESSALON ) 200 MG capsule Take 1 capsule (200 mg total) by mouth 3 (three) times daily as needed for cough. 30 capsule 11   HYDROcodone  bit-homatropine (HYCODAN) 5-1.5 MG/5ML syrup Take 5 mLs by mouth every 6 (six) hours as needed for cough. 240 mL 0   ketoconazole  (NIZORAL ) 2 % cream Apply 1 Application topically daily. 15 g 0   predniSONE  (DELTASONE ) 20 MG tablet 3 tabs by mouth daily x 3 days, then 2 tabs by mouth daily x 2 days then 1 tab by mouth daily x 2 days 15 tablet 0   sodium chloride  (OCEAN) 0.65 % SOLN nasal spray Place 1 spray into both nostrils as needed for congestion. 15 mL 0   triamcinolone  cream (KENALOG ) 0.5 % Apply 1 Application topically 2 (two) times daily. 30 g 0   No facility-administered medications prior to  visit.     Per HPI unless specifically indicated in ROS section below Review of Systems  Constitutional:  Negative for fatigue and fever.  HENT:  Negative for ear pain.   Eyes:  Negative for pain.  Respiratory:  Negative for cough and shortness of breath.   Cardiovascular:  Negative for chest pain, palpitations and leg swelling.  Gastrointestinal:  Negative for abdominal pain.  Genitourinary:  Negative for dysuria.  Musculoskeletal:  Negative for arthralgias.  Neurological:  Negative for syncope, light-headedness and headaches.  Psychiatric/Behavioral:  Negative for dysphoric mood.    Objective:  BP 110/60 (BP  Location: Left Arm, Patient Position: Sitting, Cuff Size: Large)   Pulse 90   Temp (!) 97.4 F (36.3 C) (Temporal)   Ht 5' 10.25 (1.784 m)   Wt 278 lb 2 oz (126.2 kg)   SpO2 99%   BMI 39.62 kg/m   Wt Readings from Last 3 Encounters:  03/05/23 278 lb 2 oz (126.2 kg)  10/02/22 280 lb 4 oz (127.1 kg)  10/01/22 274 lb 2 oz (124.3 kg)      Physical Exam Constitutional:      General: He is not in acute distress.    Appearance: Normal appearance. He is well-developed. He is not ill-appearing or toxic-appearing.  HENT:     Head: Normocephalic and atraumatic.     Right Ear: Hearing, tympanic membrane, ear canal and external ear normal.     Left Ear: Hearing, tympanic membrane, ear canal and external ear normal.     Nose: Nose normal.     Mouth/Throat:     Pharynx: Uvula midline.  Eyes:     General: Lids are normal. Lids are everted, no foreign bodies appreciated.     Conjunctiva/sclera: Conjunctivae normal.     Pupils: Pupils are equal, round, and reactive to light.  Neck:     Thyroid: No thyroid mass or thyromegaly.     Vascular: No carotid bruit.     Trachea: Trachea and phonation normal.  Cardiovascular:     Rate and Rhythm: Normal rate and regular rhythm.     Pulses: Normal pulses.     Heart sounds: S1 normal and S2 normal. No murmur heard.    No gallop.   Pulmonary:     Breath sounds: Normal breath sounds. No wheezing, rhonchi or rales.  Abdominal:     General: Bowel sounds are normal.     Palpations: Abdomen is soft.     Tenderness: There is no abdominal tenderness. There is no guarding or rebound.     Hernia: No hernia is present.  Musculoskeletal:     Cervical back: Normal range of motion and neck supple.  Lymphadenopathy:     Cervical: No cervical adenopathy.  Skin:    General: Skin is warm and dry.     Findings: No rash.  Neurological:     Mental Status: He is alert.     Cranial Nerves: No cranial nerve deficit.     Sensory: No sensory deficit.     Gait: Gait normal.     Deep Tendon Reflexes: Reflexes are normal and symmetric.  Psychiatric:        Speech: Speech normal.        Behavior: Behavior normal.        Judgment: Judgment normal.    Diabetic foot exam: Normal inspection No skin breakdown No calluses  Normal DP pulses Normal sensation to light touch and monofilament Nails normal     Results for orders placed or performed in visit on 03/05/23  HM DIABETES FOOT EXAM   Collection Time: 03/05/23 12:00 AM  Result Value Ref Range   HM Diabetic Foot Exam done      COVID 19 screen:  No recent travel or known exposure to COVID19 The patient denies respiratory symptoms of COVID 19 at this time. The importance of social distancing was discussed today.   Assessment and Plan The patient's preventative maintenance and recommended screening tests for an annual wellness exam were reviewed in full today. Brought up to date unless services declined.  Counselled on the importance of diet,  exercise, and its role in overall health and mortality. The patient's FH and SH was reviewed, including their home life, tobacco status, and drug and alcohol status.   Due for yearly eye exam Vaccines: Up-to-date with Shingrix, flu and accinated for COVID.[name] consider PNA 23 given asthma. Prostate Cancer Screen:  Lab Results   Component Value Date   PSA 0.61 03/04/2023   PSA 0.77 01/30/2022   PSA 0.40 08/12/2020  Colon Cancer Screen: Colonoscopy October 11, 2018 repeat due in 5 years      Smoking Status: Former  chewing tobacco ETOH/ drug use: Limited/none  Hep C:  done  HIV screen:     Problem List Items Addressed This Visit     CKD (chronic kidney disease), stage III (HCC)    chronic, stable, followed by renal.      Class 2 severe obesity due to excess calories with serious comorbidity and body mass index (BMI) of 35.0 to 35.9 in adult [facility])   Encouraged exercise, weight loss, healthy eating habits. Current dose of semaglutide       GOUT    Chronic, some increase in uric acid. Work on low uric acid diet.   No  recent flares   Allopurinol  300 mg daily.      Hyperlipidemia associated with type 2 diabetes mellitus (HCC)   Chronic, well-controlled atorvastatin  40 mg daily      Hypertension associated with diabetes (HCC)   Stable, chronic.  Continue current medication.  Coreg  6.25 mg p.o. twice daily Irbesartan  500 mg p.o. daily Amlodipine  5 mg daily      Type 2 diabetes mellitus with other circulatory complications (HCC)   Chronic, stable control on current regimen He has not been able to work on healthy eating and has been intermittently compliant with medication given his current respiratory illness. I encouraged him to work on compliance with medication.  Jardiance  25 mg p.o. daily.. was to costly...  may need to  switch back to invokana Semaglutide  2 mg weekly      Other Visit Diagnoses       Routine general medical examination at a health care facility    -  Primary           Greig Ring, MD

## 2023-03-05 NOTE — Assessment & Plan Note (Addendum)
 Chronic, stable control on current regimen He has not been able to work on healthy eating and has been intermittently compliant with medication given his current respiratory illness. I encouraged him to work on compliance with medication.  Jardiance  25 mg p.o. daily.. was to costly...  may need to  switch back to invokana Semaglutide  2 mg weekly

## 2023-03-05 NOTE — Assessment & Plan Note (Signed)
Stable, chronic.  Continue current medication.  Coreg 6.25 mg p.o. twice daily Irbesartan 500 mg p.o. daily Amlodipine 5 mg daily

## 2023-03-05 NOTE — Assessment & Plan Note (Addendum)
 Chronic, some increase in uric acid. Work on low uric acid diet.   No  recent flares   Allopurinol 300 mg daily.

## 2023-03-05 NOTE — Patient Instructions (Signed)
Work on low uric acid diet. 

## 2023-03-05 NOTE — Assessment & Plan Note (Signed)
Encouraged exercise, weight loss, healthy eating habits. Current dose of semaglutide

## 2023-03-09 ENCOUNTER — Encounter: Payer: Self-pay | Admitting: Family Medicine

## 2023-03-11 ENCOUNTER — Other Ambulatory Visit (HOSPITAL_COMMUNITY): Payer: Self-pay

## 2023-03-11 ENCOUNTER — Telehealth: Payer: Self-pay | Admitting: Pharmacist

## 2023-03-11 NOTE — Telephone Encounter (Signed)
Pharmacy Patient Advocate Encounter   Received notification from Physician's Office that prior authorization for Ozempic (2 MG/DOSE) 8MG /3ML pen-injectors is required/requested.   Insurance verification completed.   The patient is insured through [facility] .   Per test claim: PA required; PA submitted to above mentioned insurance via CoverMyMeds Key/confirmation #/EOC [id number] Status is pending

## 2023-03-16 ENCOUNTER — Other Ambulatory Visit (HOSPITAL_COMMUNITY): Payer: Self-pay

## 2023-03-16 ENCOUNTER — Encounter: Payer: Self-pay | Admitting: *Deleted

## 2023-03-16 NOTE — Telephone Encounter (Signed)
Pharmacy Patient Advocate Encounter  Received notification from [facility] that Prior Authorization for Ozempic (2 MG/DOSE) 8MG /3ML pen-injectors has been APPROVED from 03/11/2023 to 03/10/2024   PA #/Case ID/Reference #: [id number]

## 2023-03-18 ENCOUNTER — Other Ambulatory Visit (HOSPITAL_COMMUNITY): Payer: Self-pay

## 2023-04-21 ENCOUNTER — Other Ambulatory Visit: Payer: Self-pay | Admitting: Family Medicine

## 2023-05-08 ENCOUNTER — Other Ambulatory Visit: Payer: Self-pay | Admitting: Family Medicine

## 2023-05-19 ENCOUNTER — Other Ambulatory Visit: Payer: Self-pay | Admitting: Family Medicine

## 2023-05-20 ENCOUNTER — Other Ambulatory Visit: Payer: Self-pay | Admitting: Internal Medicine

## 2023-05-28 ENCOUNTER — Encounter: Payer: Self-pay | Admitting: Family Medicine

## 2023-05-28 ENCOUNTER — Ambulatory Visit (INDEPENDENT_AMBULATORY_CARE_PROVIDER_SITE_OTHER): Admitting: Family Medicine

## 2023-05-28 ENCOUNTER — Ambulatory Visit: Payer: Self-pay

## 2023-05-28 VITALS — BP 90/60 | HR 91 | Temp 97.4°F | Ht 70.25 in | Wt 267.4 lb

## 2023-05-28 DIAGNOSIS — L03311 Cellulitis of abdominal wall: Secondary | ICD-10-CM | POA: Diagnosis not present

## 2023-05-28 DIAGNOSIS — Z7985 Long-term (current) use of injectable non-insulin antidiabetic drugs: Secondary | ICD-10-CM | POA: Diagnosis not present

## 2023-05-28 DIAGNOSIS — E1159 Type 2 diabetes mellitus with other circulatory complications: Secondary | ICD-10-CM | POA: Diagnosis not present

## 2023-05-28 MED ORDER — CEFTRIAXONE SODIUM 1 G IJ SOLR
1.0000 g | Freq: Once | INTRAMUSCULAR | Status: AC
  Administered 2023-05-28: 1 g via INTRAMUSCULAR

## 2023-05-28 MED ORDER — DOXYCYCLINE HYCLATE 100 MG PO TABS: 100.0000 mg | ORAL_TABLET | Freq: Two times a day (BID) | ORAL | 0 refills | Status: DC

## 2023-05-28 NOTE — Patient Instructions (Signed)
 Complete antibiotics.  Apply warm compresses 3-4 times dialy.  If redness is spreading past black line or fever on antibiotics or unable to tolerated oral antibitoics... go to ER.  Clean personal items as discussed.

## 2023-05-28 NOTE — Addendum Note (Signed)
 Addended by: Damita Lack on: 05/28/2023 09:02 AM   Modules accepted: Orders

## 2023-05-28 NOTE — Assessment & Plan Note (Signed)
 Acute, patient with personal history of MRSA, diabetic.  Patient nontoxic , afebrile with normal vital signs. (Low blood pressure is normal for him) Likely bacterial infection possible MRSA.  No area of fluctuance, no indication for incision and drainage at this time. Will give Rocephin 1 g x 1 in office today and have patient start doxycycline 100 mg twice daily x 10 days. Start warm compresses 3-4 times a day as needed. Used sharpie to mark edges of cellulitis, patient instructed to be seen over the weekend if redness passes the red line, or if fever on the antibiotics or if not tolerating the antibiotics.  Discussed likelihood of MRSA infection, likely introduced with Ozempic injection. He will follow his blood sugar closely at home while cellulitis ongoing. He will sanitize his personal items in home environment.  He will change to antibacterial soap and use this long-term.  Return and ER precautions provided. Close follow-up early next week for reassessment.

## 2023-05-28 NOTE — Telephone Encounter (Signed)
 Copied from CRM [id number]. Topic: Clinical - Red Word Triage >> May 28, 2023  7:52 AM [place] A wrote: [facility] that prompted transfer to Nurse Triage: Patient states he has bump or ingrown hair that is red and hard and growing. Very painful to touch   Requested Prescriptions    Chief Complaint: Skin bump that looks infected to abdomen. Red, tender. Symptoms: Above Frequency: 1 week Pertinent Negatives: Patient denies fever Disposition: [] ED /[] Urgent Care (no appt availability in office) / [x] Appointment(In office/virtual)/ []  [facility]/ [] Home Care/ [] Refused Recommended Disposition /[] [place] [facility]/ []  Follow-up with PCP Additional Notes: Agrees with appointment.  Reason for Disposition  Boil > 2 inches across (> 5 cm; larger than a golf ball or ping pong ball)  Answer Assessment - Initial Assessment Questions 1. APPEARANCE of BOIL: "What does the boil look like?"      Lump 2. LOCATION: "Where is the boil located?"      Abdomen on left 3. NUMBER: "How many boils are there?"      1 4. SIZE: "How big is the boil?" (e.g., inches, cm; compare to size of a coin or other object)     Half dollar size 5. ONSET: "When did the boil start?"     This week 6. PAIN: "Is there any pain?" If Yes, ask: "How bad is the pain?"   (Scale 1-10; or mild, moderate, severe)     Moderate 7. FEVER: "Do you have a fever?" If Yes, ask: "What is it, how was it measured, and when did it start?"      No 8. SOURCE: "Have you been around anyone with boils or other Staph infections?" "Have you ever had boils before?"     No 9. OTHER SYMPTOMS: "Do you have any other symptoms?" (e.g., shaking chills, weakness, rash elsewhere on body)     No 10. PREGNANCY: "Is there any chance you are pregnant?" "When was your last menstrual period?"       N/A  Protocols used: Boil (Skin Abscess)-A-AH

## 2023-05-28 NOTE — Progress Notes (Signed)
 Patient ID: Richard Giles, male    DOB: Jan 31, 1961, 63 y.o.   MRN: [id number]  This visit was conducted in person.  BP 90/60 (BP Location: Left Arm, Patient Position: Sitting, Cuff Size: Large)   Pulse 91   Temp (!) 97.4 F (36.3 C) (Temporal)   Ht 5' 10.25" (1.784 m)   Wt 267 lb 6 oz (121.3 kg)   SpO2 97%   BMI 38.09 kg/m    CC:  Chief Complaint  Patient presents with   Insect Bite    Stomach    Subjective:   HPI: Richard Giles is a 63 y.o. male presenting on 05/28/2023 for Insect Bite (Stomach)  New onset skin lesion on abdomen... he reports he first noted  1 week ago... looked like ingrown hair.. small bump.  Increasing in size, now redness around the area.  No drainage.  Firm around central pustule.   No known Bites. He does do shot for ozempic at abdomen.   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   No fever, no flu like symptoms... feeling tired lately.   FBS not checked lately. Lab Results  Component Value Date   HGBA1C 6.3 03/04/2023   BP Readings from Last 3 Encounters:  05/28/23 90/60  03/05/23 110/60  10/02/22 110/68        Relevant past medical, surgical, family and social history reviewed and updated as indicated. Interim medical history since our last visit reviewed. Allergies and medications reviewed and updated. Outpatient Medications Prior to Visit  Medication Sig Dispense Refill   ACCU-CHEK GUIDE test strip TEST BLOOD SUGAR EVERY DAY 100 strip 3   Accu-Chek Softclix Lancets lancets TEST BLOOD SUGAR ONCE DAILY     acetaminophen (TYLENOL) 500 MG tablet Take 1,000 mg by mouth every 6 (six) hours as needed (pain).     albuterol (PROVENTIL) (2.5 MG/3ML) 0.083% nebulizer solution Take 3 mLs (2.5 mg total) by nebulization every 6 (six) hours as needed for wheezing or shortness of breath. 150 mL 1   albuterol (VENTOLIN HFA) 108 (90 Base) MCG/ACT inhaler Inhale 2 puffs into the lungs every 4 (four) hours as needed for wheezing or shortness of breath. 1 each 3    allopurinol (ZYLOPRIM) 300 MG tablet TAKE 1 TABLET BY MOUTH EVERY DAY 90 tablet 3   aspirin 81 MG tablet Take 81 mg by mouth daily.     atorvastatin (LIPITOR) 40 MG tablet TAKE 1 TABLET BY MOUTH EVERY DAY 90 tablet 3   Blood Glucose Monitoring Suppl (ACCU-CHEK GUIDE ME) w/Device KIT daily.     budesonide-formoterol (SYMBICORT) 80-4.5 MCG/ACT inhaler Take 2 puffs first thing in am and then another 2 puffs about 12 hours later.Take 2 puffs first thing in am and then another 2 puffs about 12 hours later. 1 each 11   cetirizine (ZYRTEC) 10 MG tablet Take 1 tablet (10 mg total) by mouth daily. 14 tablet 0   Cholecalciferol (VITAMIN D3 PO) Take 1 tablet by mouth daily.     cyclobenzaprine (FLEXERIL) 10 MG tablet Take 1 tablet (10 mg total) by mouth 3 (three) times daily as needed for muscle spasms. 20 tablet 0   empagliflozin (JARDIANCE) 25 MG TABS tablet TAKE 1 TABLET BY MOUTH DAILY BEFORE BREAKFAST. 90 tablet 0   EPINEPHrine 0.3 mg/0.3 mL IJ SOAJ injection Inject 0.3 mg into the muscle as needed for anaphylaxis. 1 each 1   famotidine (PEPCID) 20 MG tablet TAKE 1 TABLET BY MOUTH EVERY DAY AFTER SUPPER 90  tablet 3   fexofenadine (ALLEGRA) 180 MG tablet Take 180 mg by mouth daily.      FLUoxetine (PROZAC) 20 MG capsule TAKE 1 CAPSULE BY MOUTH EVERY DAY 90 capsule 1   fluticasone (FLONASE) 50 MCG/ACT nasal spray SPRAY 2 SPRAYS INTO EACH NOSTRIL EVERY DAY 48 mL 0   furosemide (LASIX) 20 MG tablet TAKE 1 TABLET BY MOUTH EVERY DAY AS NEEDED 90 tablet 1   irbesartan (AVAPRO) 150 MG tablet TAKE 1 TABLET BY MOUTH EVERY DAY 90 tablet 3   meclizine (ANTIVERT) 25 MG tablet Take 1 tablet (25 mg total) by mouth 3 (three) times daily as needed for dizziness. Caution of sedation 30 tablet 0   montelukast (SINGULAIR) 10 MG tablet TAKE 1 TABLET BY MOUTH EVERYDAY AT BEDTIME 90 tablet 3   pantoprazole (PROTONIX) 40 MG tablet Take 1 tablet (40 mg total) by mouth daily. Take 30-60 min before first meal of the day 30 tablet 2    Probiotic Product (PROBIOTIC PO) Take 1 tablet by mouth daily.     Semaglutide, 2 MG/DOSE, (OZEMPIC, 2 MG/DOSE,) 8 MG/3ML SOPN Inject 2 mg into the skin once a week. 9 mL 3   No facility-administered medications prior to visit.     Per HPI unless specifically indicated in ROS section below Review of Systems  Constitutional:  Positive for fatigue. Negative for fever.  HENT:  Negative for ear pain.   Eyes:  Negative for pain.  Respiratory:  Negative for cough and shortness of breath.   Cardiovascular:  Negative for chest pain, palpitations and leg swelling.  Gastrointestinal:  Negative for abdominal pain.  Genitourinary:  Negative for dysuria.  Musculoskeletal:  Negative for arthralgias.  Neurological:  Negative for syncope, light-headedness and headaches.  Psychiatric/Behavioral:  Negative for dysphoric mood.    Objective:  BP 90/60 (BP Location: Left Arm, Patient Position: Sitting, Cuff Size: Large)   Pulse 91   Temp (!) 97.4 F (36.3 C) (Temporal)   Ht 5' 10.25" (1.784 m)   Wt 267 lb 6 oz (121.3 kg)   SpO2 97%   BMI 38.09 kg/m   Wt Readings from Last 3 Encounters:  05/28/23 267 lb 6 oz (121.3 kg)  03/05/23 278 lb 2 oz (126.2 kg)  10/02/22 280 lb 4 oz (127.1 kg)      Physical Exam Constitutional:      Appearance: He is well-developed.  HENT:     Head: Normocephalic.     Right Ear: Hearing normal.     Left Ear: Hearing normal.     Nose: Nose normal.  Neck:     Thyroid: No thyroid mass or thyromegaly.     Vascular: No carotid bruit.     Trachea: Trachea normal.  Cardiovascular:     Rate and Rhythm: Normal rate and regular rhythm.     Pulses: Normal pulses.     Heart sounds: Heart sounds not distant. No murmur heard.    No friction rub. No gallop.     Comments: No peripheral edema Pulmonary:     Effort: Pulmonary effort is normal. No respiratory distress.     Breath sounds: Normal breath sounds.  Skin:    General: Skin is warm and dry.     Findings: Rash  present.          Comments:  14 x 12 cm erythema  on left lower abdomen with central induration x 6 cm diameter, small central pustule unroofed and cultured. No fluctuance or evidence of  need for drainage.  Psychiatric:        Speech: Speech normal.        Behavior: Behavior normal.        Thought Content: Thought content normal.       Results for orders placed or performed in visit on 03/05/23  HM DIABETES FOOT EXAM   Collection Time: 03/05/23 12:00 AM  Result Value Ref Range   HM Diabetic Foot Exam done     Assessment and Plan  Abdominal wall cellulitis Assessment & Plan: Acute, patient with personal history of MRSA, diabetic.  Patient nontoxic , afebrile with normal vital signs. (Low blood pressure is normal for him) Likely bacterial infection possible MRSA.  No area of fluctuance, no indication for incision and drainage at this time. Will give Rocephin 1 g x 1 in office today and have patient start doxycycline 100 mg twice daily x 10 days. Start warm compresses 3-4 times a day as needed. Used sharpie to mark edges of cellulitis, patient instructed to be seen over the weekend if redness passes the red line, or if fever on the antibiotics or if not tolerating the antibiotics.  Discussed likelihood of MRSA infection, likely introduced with Ozempic injection. He will follow his blood sugar closely at home while cellulitis ongoing. He will sanitize his personal items in home environment.  He will change to antibacterial soap and use this long-term.  Return and ER precautions provided. Close follow-up early next week for reassessment.  Orders: -     WOUND CULTURE  Type 2 diabetes mellitus with other circulatory complications (HCC) Assessment & Plan: Chronic, well-controlled on Ozempic.  Use other location for Ozempic injections other than abdomen.  Follow blood sugars closely while cellulitis is ongoing.   Other orders -     Doxycycline Hyclate; Take 1 tablet (100 mg  total) by mouth 2 (two) times daily.  Dispense: 20 tablet; Refill: 0    Return in about 4 days (around 06/01/2023) for  follow up cellultitis.   Kerby Nora, MD

## 2023-05-28 NOTE — Assessment & Plan Note (Addendum)
 Chronic, well-controlled on Ozempic.  Use other location for Ozempic injections other than abdomen.  Follow blood sugars closely while cellulitis is ongoing.

## 2023-05-31 ENCOUNTER — Encounter: Payer: Self-pay | Admitting: Family Medicine

## 2023-05-31 LAB — WOUND CULTURE
MICRO NUMBER:: 16290618
SPECIMEN QUALITY:: ADEQUATE

## 2023-06-01 ENCOUNTER — Encounter: Payer: Self-pay | Admitting: Family Medicine

## 2023-06-01 ENCOUNTER — Telehealth: Payer: Self-pay | Admitting: *Deleted

## 2023-06-01 ENCOUNTER — Ambulatory Visit (INDEPENDENT_AMBULATORY_CARE_PROVIDER_SITE_OTHER): Admitting: Family Medicine

## 2023-06-01 VITALS — BP 100/60 | HR 90 | Temp 98.1°F | Ht 70.25 in | Wt 267.2 lb

## 2023-06-01 DIAGNOSIS — B9562 Methicillin resistant Staphylococcus aureus infection as the cause of diseases classified elsewhere: Secondary | ICD-10-CM | POA: Diagnosis not present

## 2023-06-01 DIAGNOSIS — Z7985 Long-term (current) use of injectable non-insulin antidiabetic drugs: Secondary | ICD-10-CM

## 2023-06-01 DIAGNOSIS — L0390 Cellulitis, unspecified: Secondary | ICD-10-CM

## 2023-06-01 DIAGNOSIS — E1159 Type 2 diabetes mellitus with other circulatory complications: Secondary | ICD-10-CM | POA: Diagnosis not present

## 2023-06-01 MED ORDER — SULFAMETHOXAZOLE-TRIMETHOPRIM 800-160 MG PO TABS: 2.0000 | ORAL_TABLET | Freq: Two times a day (BID) | ORAL | 0 refills | Status: DC

## 2023-06-01 NOTE — Assessment & Plan Note (Signed)
 Chronic, well-controlled on Ozempic.  Use other location for Ozempic injections other than abdomen.  Follow blood sugars closely while cellulitis is ongoing.

## 2023-06-01 NOTE — Patient Instructions (Addendum)
 Increase priolsec to 2 tabs daily and consider PEPCID AC 2 times daily as needed.  Stop doxycycline.. change to Bactrim DS 2 twice daily.  Continue warm compresses after removing packing .  GO to ER if redness spreading or fever on antibiotics or if unable to keep down antibiotics.

## 2023-06-01 NOTE — Assessment & Plan Note (Addendum)
 Acute, minimal improvement with 4 days of doxycycline.  Now also with new lesion near umbilicus.  Originally given ceftriaxone but culture grew MRSA. Given possible doxycycline resistance will change antibiotics to Bactrim double strength 2 tablets twice daily for 10 days. Area of fluctuance noted centrally today so this area will be incised and drained.  No fluctuance noted at skin lesion near umbilicus. If patient continues to have nausea and vomiting and if not keeping down antibiotics he will go to the emergency room for IV antibiotics.  Close follow-up in 2 days for reassessment.

## 2023-06-01 NOTE — Progress Notes (Addendum)
 Patient ID: Richard Giles, male    DOB: 1960-08-12, 63 y.o.   MRN: [id number]  This visit was conducted in person.  BP 100/60 (BP Location: Left Arm, Patient Position: Sitting, Cuff Size: Large)   Pulse 90   Temp 98.1 F (36.7 C) (Temporal)   Ht 5' 10.25" (1.784 m)   Wt 267 lb 4 oz (121.2 kg)   SpO2 98%   BMI 38.07 kg/m    CC:  Chief Complaint  Patient presents with   Cellulitis    Follow up     Subjective:   HPI: Richard Giles is a 63 y.o. male presenting on 06/01/2023 for Cellulitis (Follow up/)   Presents for 4-day follow-up of abdominal wall cellulitis.  Treated with ceftriaxone 1 g injection at day of service.  On day 4 of 10 of doxycycline. Culture returned showing MRSA.  He reports less redness. Has noted new skin lesion. Has seen some discharge, clear whitish  Has noted new skin lesion at umbilicus  No fever.   Doing warm compresses and hearting pad.   Has noted some nausea, heartburn... threw up this AM.. on OTC prilosec 20 mg daily.... feel like it happened after tangerines.  No abdominal pain.  He has noted some diarrhea.     BP Readings from Last 3 Encounters:  06/01/23 100/60  05/28/23 90/60  03/05/23 110/60         Relevant past medical, surgical, family and social history reviewed and updated as indicated. Interim medical history since our last visit reviewed. Allergies and medications reviewed and updated. Outpatient Medications Prior to Visit  Medication Sig Dispense Refill   ACCU-CHEK GUIDE test strip TEST BLOOD SUGAR EVERY DAY 100 strip 3   Accu-Chek Softclix Lancets lancets TEST BLOOD SUGAR ONCE DAILY     acetaminophen (TYLENOL) 500 MG tablet Take 1,000 mg by mouth every 6 (six) hours as needed (pain).     albuterol (PROVENTIL) (2.5 MG/3ML) 0.083% nebulizer solution Take 3 mLs (2.5 mg total) by nebulization every 6 (six) hours as needed for wheezing or shortness of breath. 150 mL 1   albuterol (VENTOLIN HFA) 108 (90 Base) MCG/ACT inhaler  Inhale 2 puffs into the lungs every 4 (four) hours as needed for wheezing or shortness of breath. 1 each 3   allopurinol (ZYLOPRIM) 300 MG tablet TAKE 1 TABLET BY MOUTH EVERY DAY 90 tablet 3   aspirin 81 MG tablet Take 81 mg by mouth daily.     atorvastatin (LIPITOR) 40 MG tablet TAKE 1 TABLET BY MOUTH EVERY DAY 90 tablet 3   Blood Glucose Monitoring Suppl (ACCU-CHEK GUIDE ME) w/Device KIT daily.     budesonide-formoterol (SYMBICORT) 80-4.5 MCG/ACT inhaler Take 2 puffs first thing in am and then another 2 puffs about 12 hours later.Take 2 puffs first thing in am and then another 2 puffs about 12 hours later. 1 each 11   cetirizine (ZYRTEC) 10 MG tablet Take 1 tablet (10 mg total) by mouth daily. 14 tablet 0   Cholecalciferol (VITAMIN D3 PO) Take 1 tablet by mouth daily.     cyclobenzaprine (FLEXERIL) 10 MG tablet Take 1 tablet (10 mg total) by mouth 3 (three) times daily as needed for muscle spasms. 20 tablet 0   empagliflozin (JARDIANCE) 25 MG TABS tablet TAKE 1 TABLET BY MOUTH DAILY BEFORE BREAKFAST. 90 tablet 0   EPINEPHrine 0.3 mg/0.3 mL IJ SOAJ injection Inject 0.3 mg into the muscle as needed for anaphylaxis. 1 each 1  famotidine (PEPCID) 20 MG tablet TAKE 1 TABLET BY MOUTH EVERY DAY AFTER SUPPER 90 tablet 3   fexofenadine (ALLEGRA) 180 MG tablet Take 180 mg by mouth daily.      FLUoxetine (PROZAC) 20 MG capsule TAKE 1 CAPSULE BY MOUTH EVERY DAY 90 capsule 1   fluticasone (FLONASE) 50 MCG/ACT nasal spray SPRAY 2 SPRAYS INTO EACH NOSTRIL EVERY DAY 48 mL 0   furosemide (LASIX) 20 MG tablet TAKE 1 TABLET BY MOUTH EVERY DAY AS NEEDED 90 tablet 1   irbesartan (AVAPRO) 150 MG tablet TAKE 1 TABLET BY MOUTH EVERY DAY 90 tablet 3   meclizine (ANTIVERT) 25 MG tablet Take 1 tablet (25 mg total) by mouth 3 (three) times daily as needed for dizziness. Caution of sedation 30 tablet 0   montelukast (SINGULAIR) 10 MG tablet TAKE 1 TABLET BY MOUTH EVERYDAY AT BEDTIME 90 tablet 3   pantoprazole (PROTONIX) 40  MG tablet Take 1 tablet (40 mg total) by mouth daily. Take 30-60 min before first meal of the day 30 tablet 2   Probiotic Product (PROBIOTIC PO) Take 1 tablet by mouth daily.     Semaglutide, 2 MG/DOSE, (OZEMPIC, 2 MG/DOSE,) 8 MG/3ML SOPN Inject 2 mg into the skin once a week. 9 mL 3   doxycycline (VIBRA-TABS) 100 MG tablet Take 1 tablet (100 mg total) by mouth 2 (two) times daily. 20 tablet 0   No facility-administered medications prior to visit.     Per HPI unless specifically indicated in ROS section below Review of Systems  Constitutional:  Negative for fatigue and fever.  HENT:  Negative for ear pain.   Eyes:  Negative for pain.  Respiratory:  Negative for cough and shortness of breath.   Cardiovascular:  Negative for chest pain, palpitations and leg swelling.  Gastrointestinal:  Positive for nausea and vomiting. Negative for abdominal pain.  Genitourinary:  Negative for dysuria.  Musculoskeletal:  Negative for arthralgias.  Neurological:  Negative for syncope, light-headedness and headaches.  Psychiatric/Behavioral:  Negative for dysphoric mood.    Objective:  BP 100/60 (BP Location: Left Arm, Patient Position: Sitting, Cuff Size: Large)   Pulse 90   Temp 98.1 F (36.7 C) (Temporal)   Ht 5' 10.25" (1.784 m)   Wt 267 lb 4 oz (121.2 kg)   SpO2 98%   BMI 38.07 kg/m   Wt Readings from Last 3 Encounters:  06/01/23 267 lb 4 oz (121.2 kg)  05/28/23 267 lb 6 oz (121.3 kg)  03/05/23 278 lb 2 oz (126.2 kg)      Physical Exam Vitals reviewed.  Constitutional:      Appearance: He is well-developed.  HENT:     Head: Normocephalic.     Right Ear: Hearing normal.     Left Ear: Hearing normal.     Nose: Nose normal.  Neck:     Thyroid: No thyroid mass or thyromegaly.     Vascular: No carotid bruit.     Trachea: Trachea normal.  Cardiovascular:     Rate and Rhythm: Normal rate and regular rhythm.     Pulses: Normal pulses.     Heart sounds: Heart sounds not distant. No  murmur heard.    No friction rub. No gallop.     Comments: No peripheral edema Pulmonary:     Effort: Pulmonary effort is normal. No respiratory distress.     Breath sounds: Normal breath sounds.  Skin:    General: Skin is warm and dry.  Findings: No rash.  Psychiatric:        Speech: Speech normal.        Behavior: Behavior normal.        Thought Content: Thought content normal.        Results for orders placed or performed in visit on 05/28/23  WOUND CULTURE   Collection Time: 05/28/23  9:07 AM   Specimen: Wound  Result Value Ref Range   MICRO NUMBER: [id number]    SPECIMEN QUALITY: Adequate    SOURCE: ABDOMEN    STATUS: FINAL    GRAM STAIN:      No white blood cells seen No epithelial cells seen Few Gram positive cocci in pairs   ISOLATE 1: methicillin resistant Staphylococcus aureus (A)       Susceptibility   Methicillin resistant staphylococcus aureus - AEROBIC CULT, GRAM STAIN POSITIVE 1    VANCOMYCIN 1 Sensitive     CIPROFLOXACIN >=8 Resistant     CLINDAMYCIN <=0.25 Sensitive     LEVOFLOXACIN >=8 Resistant     ERYTHROMYCIN <=0.25 Sensitive     GENTAMICIN <=0.5 Sensitive     OXACILLIN* NR Resistant      * Oxacillin-resistant staphylococci are resistant to all currently available beta-lactam antimicrobial agents with the possible exception of ceftaroline.     TETRACYCLINE <=1 Sensitive     TRIMETH/SULFA <=10 Sensitive     MOXIFLOXACIN* 2 Resistant      * Oxacillin-resistant staphylococci are resistant to all currently available beta-lactam antimicrobial agents with the possible exception of ceftaroline. Legend: S = Susceptible  I = Intermediate R = Resistant  NS = Not susceptible SDD = Susceptible Dose Dependent * = Not Tested  NR = Not Reported **NN = See Therapy Comments     Assessment and Plan  MRSA cellulitis Assessment & Plan: Acute, minimal improvement with 4 days of doxycycline.  Now also with new lesion near umbilicus.  Originally given  ceftriaxone but culture grew MRSA. Given possible doxycycline resistance will change antibiotics to Bactrim double strength 2 tablets twice daily for 10 days. Area of fluctuance noted centrally today so this area will be incised and drained.  No fluctuance noted at skin lesion near umbilicus. If patient continues to have nausea and vomiting and if not keeping down antibiotics he will go to the emergency room for IV antibiotics.  Close follow-up in 2 days for reassessment.    Type 2 diabetes mellitus with other circulatory complications [facility]) Assessment & Plan: Chronic, well-controlled on Ozempic.  Use other location for Ozempic injections other than abdomen.  Follow blood sugars closely while cellulitis is ongoing.   Other orders -     Sulfamethoxazole-Trimethoprim; Take 2 tablets by mouth 2 (two) times daily.  Dispense: 40 tablet; Refill: 0    Return in about 2 days (around 06/03/2023) for  follow up cellultitis.   I&D  Meds, vitals, and allergies reviewed.   Indication: suspect abscess  Pt complaints of: erythema, pain, swelling  Location: abdominal wall  Size: see photo  Informed consent obtained.  Pt aware of risks not limited to but including infection, bleeding, damage to near by organs.  Prep: etoh/betadine  Anesthesia: 1%lidocaine with epi, good effect  Incision made with #15 blade  Wound explored, no loculations found  Wound packed with iodoform gauze  Tolerated well  Routine postprocedure instructions d/w pt- remove packing in 24-48h, keep area clean and bandaged, follow up if concerns/spreading erythema/pain.  Kerby Nora, MD

## 2023-06-01 NOTE — Telephone Encounter (Signed)
 David notified by telephone to schedule follow up in 2 days per Dr. Ermalene Searing.  He will call back to schedule.

## 2023-06-01 NOTE — Addendum Note (Signed)
 Addended by: Kerby Nora E on: 06/01/2023 12:56 PM   Modules accepted: Level of Service

## 2023-06-01 NOTE — Telephone Encounter (Signed)
-----   Message from Richard Giles sent at 06/01/2023 12:54 PM EDT ----- Can you let patient know that he needs to follow-up in 2 days for reassessment, ideally in person or at least virtual appointment if significant improvement.

## 2023-06-02 NOTE — Progress Notes (Signed)
 LMOM

## 2023-06-04 ENCOUNTER — Encounter: Payer: Self-pay | Admitting: Family Medicine

## 2023-06-04 ENCOUNTER — Ambulatory Visit: Admitting: Family Medicine

## 2023-06-04 VITALS — BP 110/60 | HR 79 | Temp 97.3°F | Ht 70.25 in | Wt 268.5 lb

## 2023-06-04 DIAGNOSIS — Z7984 Long term (current) use of oral hypoglycemic drugs: Secondary | ICD-10-CM

## 2023-06-04 DIAGNOSIS — E1159 Type 2 diabetes mellitus with other circulatory complications: Secondary | ICD-10-CM

## 2023-06-04 DIAGNOSIS — L03311 Cellulitis of abdominal wall: Secondary | ICD-10-CM

## 2023-06-04 DIAGNOSIS — Z22322 Carrier or suspected carrier of Methicillin resistant Staphylococcus aureus: Secondary | ICD-10-CM

## 2023-06-04 NOTE — Progress Notes (Signed)
 Patient ID: Richard Giles, male    DOB: 06-04-1960, 63 y.o.   MRN: [id number]  This visit was conducted in person.  BP 110/60 (BP Location: Left Arm, Patient Position: Sitting, Cuff Size: Large)   Pulse 79   Temp (!) 97.3 F (36.3 C) (Temporal)   Ht 5' 10.25" (1.784 m)   Wt 268 lb 8 oz (121.8 kg)   SpO2 97%   BMI 38.25 kg/m    CC:  Chief Complaint  Patient presents with   Cellulitis    Follow up I & D    Subjective:   HPI: Richard Giles is a 63 y.o. male presenting on 06/04/2023 for Cellulitis (Follow up I & D)    Acute MRSA cellulitis on anterior abdominal wall: Patient reports significant improvement in the last 2 days since incision and drainage and change of antibiotic to Bactrim double strength 2 tablets twice daily. He denies fever.  He still has some mild nausea but no vomiting.  He denies flulike symptoms. He feels the redness has decreased some but he still has firm indurated tissue centrally.      Relevant past medical, surgical, family and social history reviewed and updated as indicated. Interim medical history since our last visit reviewed. Allergies and medications reviewed and updated. Outpatient Medications Prior to Visit  Medication Sig Dispense Refill   ACCU-CHEK GUIDE test strip TEST BLOOD SUGAR EVERY DAY 100 strip 3   Accu-Chek Softclix Lancets lancets TEST BLOOD SUGAR ONCE DAILY     acetaminophen (TYLENOL) 500 MG tablet Take 1,000 mg by mouth every 6 (six) hours as needed (pain).     albuterol (PROVENTIL) (2.5 MG/3ML) 0.083% nebulizer solution Take 3 mLs (2.5 mg total) by nebulization every 6 (six) hours as needed for wheezing or shortness of breath. 150 mL 1   albuterol (VENTOLIN HFA) 108 (90 Base) MCG/ACT inhaler Inhale 2 puffs into the lungs every 4 (four) hours as needed for wheezing or shortness of breath. 1 each 3   allopurinol (ZYLOPRIM) 300 MG tablet TAKE 1 TABLET BY MOUTH EVERY DAY 90 tablet 3   aspirin 81 MG tablet Take 81 mg by mouth daily.      atorvastatin (LIPITOR) 40 MG tablet TAKE 1 TABLET BY MOUTH EVERY DAY 90 tablet 3   Blood Glucose Monitoring Suppl (ACCU-CHEK GUIDE ME) w/Device KIT daily.     budesonide-formoterol (SYMBICORT) 80-4.5 MCG/ACT inhaler Take 2 puffs first thing in am and then another 2 puffs about 12 hours later.Take 2 puffs first thing in am and then another 2 puffs about 12 hours later. 1 each 11   cetirizine (ZYRTEC) 10 MG tablet Take 1 tablet (10 mg total) by mouth daily. 14 tablet 0   Cholecalciferol (VITAMIN D3 PO) Take 1 tablet by mouth daily.     cyclobenzaprine (FLEXERIL) 10 MG tablet Take 1 tablet (10 mg total) by mouth 3 (three) times daily as needed for muscle spasms. 20 tablet 0   empagliflozin (JARDIANCE) 25 MG TABS tablet TAKE 1 TABLET BY MOUTH DAILY BEFORE BREAKFAST. 90 tablet 0   EPINEPHrine 0.3 mg/0.3 mL IJ SOAJ injection Inject 0.3 mg into the muscle as needed for anaphylaxis. 1 each 1   famotidine (PEPCID) 20 MG tablet TAKE 1 TABLET BY MOUTH EVERY DAY AFTER SUPPER 90 tablet 3   fexofenadine (ALLEGRA) 180 MG tablet Take 180 mg by mouth daily.      FLUoxetine (PROZAC) 20 MG capsule TAKE 1 CAPSULE BY MOUTH EVERY DAY 90  capsule 1   fluticasone (FLONASE) 50 MCG/ACT nasal spray SPRAY 2 SPRAYS INTO EACH NOSTRIL EVERY DAY 48 mL 0   furosemide (LASIX) 20 MG tablet TAKE 1 TABLET BY MOUTH EVERY DAY AS NEEDED 90 tablet 1   irbesartan (AVAPRO) 150 MG tablet TAKE 1 TABLET BY MOUTH EVERY DAY 90 tablet 3   meclizine (ANTIVERT) 25 MG tablet Take 1 tablet (25 mg total) by mouth 3 (three) times daily as needed for dizziness. Caution of sedation 30 tablet 0   montelukast (SINGULAIR) 10 MG tablet TAKE 1 TABLET BY MOUTH EVERYDAY AT BEDTIME 90 tablet 3   pantoprazole (PROTONIX) 40 MG tablet Take 1 tablet (40 mg total) by mouth daily. Take 30-60 min before first meal of the day 30 tablet 2   Probiotic Product (PROBIOTIC PO) Take 1 tablet by mouth daily.     Semaglutide, 2 MG/DOSE, (OZEMPIC, 2 MG/DOSE,) 8 MG/3ML SOPN  Inject 2 mg into the skin once a week. 9 mL 3   sulfamethoxazole-trimethoprim (BACTRIM DS) 800-160 MG tablet Take 2 tablets by mouth 2 (two) times daily. 40 tablet 0   No facility-administered medications prior to visit.     Per HPI unless specifically indicated in ROS section below Review of Systems  Constitutional:  Negative for fatigue and fever.  HENT:  Negative for ear pain.   Eyes:  Negative for pain.  Respiratory:  Negative for cough and shortness of breath.   Cardiovascular:  Negative for chest pain, palpitations and leg swelling.  Gastrointestinal:  Negative for abdominal pain.  Genitourinary:  Negative for dysuria.  Musculoskeletal:  Negative for arthralgias.  Neurological:  Negative for syncope, light-headedness and headaches.  Psychiatric/Behavioral:  Negative for dysphoric mood.    Objective:  BP 110/60 (BP Location: Left Arm, Patient Position: Sitting, Cuff Size: Large)   Pulse 79   Temp (!) 97.3 F (36.3 C) (Temporal)   Ht 5' 10.25" (1.784 m)   Wt 268 lb 8 oz (121.8 kg)   SpO2 97%   BMI 38.25 kg/m   Wt Readings from Last 3 Encounters:  06/04/23 268 lb 8 oz (121.8 kg)  06/01/23 267 lb 4 oz (121.2 kg)  05/28/23 267 lb 6 oz (121.3 kg)      Physical Exam Vitals reviewed.  Constitutional:      Appearance: He is well-developed.  HENT:     Head: Normocephalic.     Right Ear: Hearing normal.     Left Ear: Hearing normal.     Nose: Nose normal.  Neck:     Thyroid: No thyroid mass or thyromegaly.     Vascular: No carotid bruit.     Trachea: Trachea normal.  Cardiovascular:     Rate and Rhythm: Normal rate and regular rhythm.     Pulses: Normal pulses.     Heart sounds: Heart sounds not distant. No murmur heard.    No friction rub. No gallop.     Comments: No peripheral edema Pulmonary:     Effort: Pulmonary effort is normal. No respiratory distress.     Breath sounds: Normal breath sounds.  Skin:    General: Skin is warm and dry.     Findings: No rash.           Comments: See photo  Psychiatric:        Speech: Speech normal.        Behavior: Behavior normal.        Thought Content: Thought content normal.  Results for orders placed or performed in visit on 05/28/23  WOUND CULTURE   Collection Time: 05/28/23  9:07 AM   Specimen: Wound  Result Value Ref Range   MICRO NUMBER: [id number]    SPECIMEN QUALITY: Adequate    SOURCE: ABDOMEN    STATUS: FINAL    GRAM STAIN:      No white blood cells seen No epithelial cells seen Few Gram positive cocci in pairs   ISOLATE 1: methicillin resistant Staphylococcus aureus (A)       Susceptibility   Methicillin resistant staphylococcus aureus - AEROBIC CULT, GRAM STAIN POSITIVE 1    VANCOMYCIN 1 Sensitive     CIPROFLOXACIN >=8 Resistant     CLINDAMYCIN <=0.25 Sensitive     LEVOFLOXACIN >=8 Resistant     ERYTHROMYCIN <=0.25 Sensitive     GENTAMICIN <=0.5 Sensitive     OXACILLIN* NR Resistant      * Oxacillin-resistant staphylococci are resistant to all currently available beta-lactam antimicrobial agents with the possible exception of ceftaroline.     TETRACYCLINE <=1 Sensitive     TRIMETH/SULFA <=10 Sensitive     MOXIFLOXACIN* 2 Resistant      * Oxacillin-resistant staphylococci are resistant to all currently available beta-lactam antimicrobial agents with the possible exception of ceftaroline. Legend: S = Susceptible  I = Intermediate R = Resistant  NS = Not susceptible SDD = Susceptible Dose Dependent * = Not Tested  NR = Not Reported **NN = See Therapy Comments     Assessment and Plan  Abdominal wall cellulitis Assessment & Plan: Acute, significant improvement status post I&D and change of antibiotic to Bactrim double strength 2 tablets twice daily for 10 days.  He will complete the antibiotic course.  He will continue doing warm compresses.  If redness is spreading or if patient has fever or increasing pain he will go to the emergency room.     MRSA (methicillin  resistant staph aureus) culture positive  Type 2 diabetes mellitus with other circulatory complications (HCC) Assessment & Plan: Previously well-controlled, encouraged patient to check blood sugars during ongoing infection.     No follow-ups on file.   Kerby Nora, MD

## 2023-06-04 NOTE — Assessment & Plan Note (Signed)
 Previously well-controlled, encouraged patient to check blood sugars during ongoing infection.

## 2023-06-04 NOTE — Assessment & Plan Note (Signed)
 Acute, significant improvement status post I&D and change of antibiotic to Bactrim double strength 2 tablets twice daily for 10 days.  He will complete the antibiotic course.  He will continue doing warm compresses.  If redness is spreading or if patient has fever or increasing pain he will go to the emergency room.

## 2023-06-05 ENCOUNTER — Other Ambulatory Visit: Payer: Self-pay | Admitting: Family Medicine

## 2023-06-18 ENCOUNTER — Telehealth: Payer: Self-pay | Admitting: Family Medicine

## 2023-06-18 MED ORDER — SULFAMETHOXAZOLE-TRIMETHOPRIM 800-160 MG PO TABS: 2.0000 | ORAL_TABLET | Freq: Two times a day (BID) | ORAL | 0 refills | Status: DC

## 2023-06-18 NOTE — Telephone Encounter (Signed)
 Spoke with patient while he was in the office with his wife.  He has had significant improvement in his cellulitis abdominally but there is still an area of redness around the wound with firm indurated tissue.  He continues to have what appears to be an ulcer on his umbilical hernia. He feels well without fever nausea vomiting. We will repeat a course of antibiotics for the next 10 days but he will follow-up in a week for reassessment.  We will need to consider imaging versus referral. Patient agreeable.

## 2023-06-25 ENCOUNTER — Encounter: Payer: Self-pay | Admitting: Family Medicine

## 2023-06-25 ENCOUNTER — Ambulatory Visit (INDEPENDENT_AMBULATORY_CARE_PROVIDER_SITE_OTHER): Admitting: Family Medicine

## 2023-06-25 VITALS — BP 120/64 | HR 78 | Temp 97.3°F | Ht 70.25 in | Wt 268.4 lb

## 2023-06-25 DIAGNOSIS — J4541 Moderate persistent asthma with (acute) exacerbation: Secondary | ICD-10-CM | POA: Diagnosis not present

## 2023-06-25 MED ORDER — PREDNISONE 20 MG PO TABS: ORAL_TABLET | ORAL | 0 refills | Status: DC

## 2023-06-25 MED ORDER — ALBUTEROL SULFATE (2.5 MG/3ML) 0.083% IN NEBU
2.5000 mg | INHALATION_SOLUTION | Freq: Once | RESPIRATORY_TRACT | Status: AC
  Administered 2023-06-25: 2.5 mg via RESPIRATORY_TRACT

## 2023-06-25 NOTE — Progress Notes (Signed)
 Patient ID: Richard Giles, male    DOB: June 16, 1960, 63 y.o.   MRN: [id number]  This visit was conducted in person.  BP 120/64   Pulse 78   Temp (!) 97.3 F (36.3 C) (Temporal)   Ht 5' 10.25" (1.784 m)   Wt 268 lb 6 oz (121.7 kg)   SpO2 97%   BMI 38.23 kg/m    CC:  Chief Complaint  Patient presents with   Vomiting   Sinus Drainage   Nasal Congestion    Subjective:   HPI: Richard Giles is a 63 y.o. male presenting on 06/25/2023 for Vomiting, Sinus Drainage, and Nasal Congestion   Date of onset: 4 days Initial symptoms included  nasal congestion, post nasal drip Symptoms progressed to cough, productive Feeling tired  NO ST, no ear pain, no face pain Had emesis this AM after taking  cpugh med in AM  SOB ads wheeze   No fever.   Sick contacts:  none COVID testing:   none     He has tried to treat with  hydrocodone  cough syrup.. helping him sleep.  Using Symbicort  daily... rarely using albuterol .      History of moderate persistent asthma Non-smoker.      On Bactrim  DS  Relevant past medical, surgical, family and social history reviewed and updated as indicated. Interim medical history since our last visit reviewed. Allergies and medications reviewed and updated. Outpatient Medications Prior to Visit  Medication Sig Dispense Refill   ACCU-CHEK GUIDE test strip TEST BLOOD SUGAR EVERY DAY 100 strip 3   Accu-Chek Softclix Lancets lancets TEST BLOOD SUGAR ONCE DAILY     acetaminophen  (TYLENOL ) 500 MG tablet Take 1,000 mg by mouth every 6 (six) hours as needed (pain).     albuterol  (PROVENTIL ) (2.5 MG/3ML) 0.083% nebulizer solution Take 3 mLs (2.5 mg total) by nebulization every 6 (six) hours as needed for wheezing or shortness of breath. 150 mL 1   albuterol  (VENTOLIN  HFA) 108 (90 Base) MCG/ACT inhaler Inhale 2 puffs into the lungs every 4 (four) hours as needed for wheezing or shortness of breath. 1 each 3   allopurinol  (ZYLOPRIM ) 300 MG tablet TAKE 1 TABLET BY  MOUTH EVERY DAY 90 tablet 3   aspirin 81 MG tablet Take 81 mg by mouth daily.     atorvastatin  (LIPITOR) 40 MG tablet TAKE 1 TABLET BY MOUTH EVERY DAY 90 tablet 3   Blood Glucose Monitoring Suppl (ACCU-CHEK GUIDE ME) w/Device KIT daily.     budesonide -formoterol  (SYMBICORT ) 80-4.5 MCG/ACT inhaler Take 2 puffs first thing in am and then another 2 puffs about 12 hours later.Take 2 puffs first thing in am and then another 2 puffs about 12 hours later. 1 each 11   cetirizine  (ZYRTEC ) 10 MG tablet Take 1 tablet (10 mg total) by mouth daily. 14 tablet 0   Cholecalciferol (VITAMIN D3 PO) Take 1 tablet by mouth daily.     cyclobenzaprine  (FLEXERIL ) 10 MG tablet Take 1 tablet (10 mg total) by mouth 3 (three) times daily as needed for muscle spasms. 20 tablet 0   EPINEPHrine  0.3 mg/0.3 mL IJ SOAJ injection Inject 0.3 mg into the muscle as needed for anaphylaxis. 1 each 1   famotidine  (PEPCID ) 20 MG tablet TAKE 1 TABLET BY MOUTH EVERY DAY AFTER SUPPER 90 tablet 3   fexofenadine (ALLEGRA) 180 MG tablet Take 180 mg by mouth daily.      FLUoxetine  (PROZAC ) 20 MG capsule TAKE 1 CAPSULE BY  MOUTH EVERY DAY 90 capsule 1   fluticasone  (FLONASE ) 50 MCG/ACT nasal spray SPRAY 2 SPRAYS INTO EACH NOSTRIL EVERY DAY 48 mL 0   furosemide  (LASIX ) 20 MG tablet TAKE 1 TABLET BY MOUTH EVERY DAY AS NEEDED 90 tablet 1   irbesartan  (AVAPRO ) 150 MG tablet TAKE 1 TABLET BY MOUTH EVERY DAY 90 tablet 3   JARDIANCE  25 MG TABS tablet TAKE 1 TABLET BY MOUTH EVERY DAY BEFORE BREAKFAST 90 tablet 0   meclizine  (ANTIVERT ) 25 MG tablet Take 1 tablet (25 mg total) by mouth 3 (three) times daily as needed for dizziness. Caution of sedation 30 tablet 0   montelukast  (SINGULAIR ) 10 MG tablet TAKE 1 TABLET BY MOUTH EVERYDAY AT BEDTIME 90 tablet 3   pantoprazole  (PROTONIX ) 40 MG tablet Take 1 tablet (40 mg total) by mouth daily. Take 30-60 min before first meal of the day 30 tablet 2   Probiotic Product (PROBIOTIC PO) Take 1 tablet by mouth daily.      Semaglutide , 2 MG/DOSE, (OZEMPIC , 2 MG/DOSE,) 8 MG/3ML SOPN Inject 2 mg into the skin once a week. 9 mL 3   sulfamethoxazole -trimethoprim  (BACTRIM  DS) 800-160 MG tablet Take 2 tablets by mouth 2 (two) times daily. 40 tablet 0   No facility-administered medications prior to visit.     Per HPI unless specifically indicated in ROS section below Review of Systems  Constitutional:  Negative for fatigue and fever.  HENT:  Positive for congestion. Negative for ear pain.   Eyes:  Negative for pain.  Respiratory:  Positive for cough and shortness of breath.   Cardiovascular:  Negative for chest pain, palpitations and leg swelling.  Gastrointestinal:  Negative for abdominal pain.  Genitourinary:  Negative for dysuria.  Musculoskeletal:  Negative for arthralgias.  Neurological:  Negative for syncope, light-headedness and headaches.  Psychiatric/Behavioral:  Negative for dysphoric mood.    Objective:  BP 120/64   Pulse 78   Temp (!) 97.3 F (36.3 C) (Temporal)   Ht 5' 10.25" (1.784 m)   Wt 268 lb 6 oz (121.7 kg)   SpO2 97%   BMI 38.23 kg/m   Wt Readings from Last 3 Encounters:  06/25/23 268 lb 6 oz (121.7 kg)  06/04/23 268 lb 8 oz (121.8 kg)  06/01/23 267 lb 4 oz (121.2 kg)      Physical Exam Vitals reviewed.  Constitutional:      Appearance: He is well-developed.  HENT:     Head: Normocephalic.     Right Ear: Hearing normal.     Left Ear: Hearing normal.     Nose: Nose normal.  Neck:     Thyroid: No thyroid mass or thyromegaly.     Vascular: No carotid bruit.     Trachea: Trachea normal.  Cardiovascular:     Rate and Rhythm: Normal rate and regular rhythm.     Pulses: Normal pulses.     Heart sounds: Heart sounds not distant. No murmur heard.    No friction rub. No gallop.     Comments: No peripheral edema Pulmonary:     Effort: Pulmonary effort is normal. No respiratory distress.     Breath sounds: Examination of the right-middle field reveals rhonchi. Examination of  the right-lower field reveals rhonchi. Rhonchi present. No decreased breath sounds or rales.  Skin:    General: Skin is warm and dry.     Findings: No rash.  Psychiatric:        Speech: Speech normal.  Behavior: Behavior normal.        Thought Content: Thought content normal.       Results for orders placed or performed in visit on 05/28/23  WOUND CULTURE   Collection Time: 05/28/23  9:07 AM   Specimen: Wound  Result Value Ref Range   MICRO NUMBER: [id number]    SPECIMEN QUALITY: Adequate    SOURCE: ABDOMEN    STATUS: FINAL    GRAM STAIN:      No white blood cells seen No epithelial cells seen Few Gram positive cocci in pairs   ISOLATE 1: methicillin resistant Staphylococcus aureus (A)       Susceptibility   Methicillin resistant staphylococcus aureus - AEROBIC CULT, GRAM STAIN POSITIVE 1    VANCOMYCIN 1 Sensitive     CIPROFLOXACIN >=8 Resistant     CLINDAMYCIN <=0.25 Sensitive     LEVOFLOXACIN  >=8 Resistant     ERYTHROMYCIN <=0.25 Sensitive     GENTAMICIN <=0.5 Sensitive     OXACILLIN* NR Resistant      * Oxacillin-resistant staphylococci are resistant to all currently available beta-lactam antimicrobial agents with the possible exception of ceftaroline.     TETRACYCLINE <=1 Sensitive     TRIMETH /SULFA  <=10 Sensitive     MOXIFLOXACIN* 2 Resistant      * Oxacillin-resistant staphylococci are resistant to all currently available beta-lactam antimicrobial agents with the possible exception of ceftaroline. Legend: S = Susceptible  I = Intermediate R = Resistant  NS = Not susceptible SDD = Susceptible Dose Dependent * = Not Tested  NR = Not Reported **NN = See Therapy Comments     Assessment and Plan  Moderate persistent asthma with acute exacerbation Assessment & Plan: Acute, given 4 days of illness most likely trigger is viral infection.  He is already on antibiotics Bactrim  double strength for a skin infection. Significant improvement in air movement in  office after albuterol  nebulizer.  Encouraged him to use albuterol  inhaler 2 puffs every 4-6 hours as needed.  He will continue Symbicort . Will treat with prednisone  taper but if symptoms are not improving as expected we will move forward with broadening of antibiotics and possible chest x-ray. He will call with update next week.  Return and ER precautions provided.  Go to ER if severe shortness of breath.  Orders: -     Albuterol  Sulfate  Other orders -     predniSONE ; 3 tabs by mouth daily x 3 days, then 2 tabs by mouth daily x 2 days then 1 tab by mouth daily x 2 days  Dispense: 15 tablet; Refill: 0    No follow-ups on file.   Herby Lolling, MD

## 2023-06-25 NOTE — Assessment & Plan Note (Signed)
 Acute, given 4 days of illness most likely trigger is viral infection.  He is already on antibiotics Bactrim  double strength for a skin infection. Significant improvement in air movement in office after albuterol  nebulizer.  Encouraged him to use albuterol  inhaler 2 puffs every 4-6 hours as needed.  He will continue Symbicort . Will treat with prednisone  taper but if symptoms are not improving as expected we will move forward with broadening of antibiotics and possible chest x-ray. He will call with update next week.  Return and ER precautions provided.  Go to ER if severe shortness of breath.

## 2023-06-25 NOTE — Patient Instructions (Signed)
 Use albuterol  inhaler as needed for wheeze, coughing fits or shortness of breath.  Continue antibiotics for skin infection.  Complete prednisone  taper... let me know if you are not improving next week for consideration of broadening the antibiotics.

## 2023-07-02 ENCOUNTER — Other Ambulatory Visit: Payer: Self-pay

## 2023-07-02 ENCOUNTER — Emergency Department (HOSPITAL_BASED_OUTPATIENT_CLINIC_OR_DEPARTMENT_OTHER)

## 2023-07-02 ENCOUNTER — Encounter: Payer: Self-pay | Admitting: Family Medicine

## 2023-07-02 ENCOUNTER — Encounter (HOSPITAL_BASED_OUTPATIENT_CLINIC_OR_DEPARTMENT_OTHER): Payer: Self-pay | Admitting: Emergency Medicine

## 2023-07-02 ENCOUNTER — Ambulatory Visit (INDEPENDENT_AMBULATORY_CARE_PROVIDER_SITE_OTHER)
Admission: RE | Admit: 2023-07-02 | Discharge: 2023-07-02 | Disposition: A | Discharge status: Home / Self Care | Source: Ambulatory Visit | Attending: Family Medicine | Admitting: Family Medicine

## 2023-07-02 ENCOUNTER — Ambulatory Visit (INDEPENDENT_AMBULATORY_CARE_PROVIDER_SITE_OTHER): Admitting: Family Medicine

## 2023-07-02 ENCOUNTER — Emergency Department (HOSPITAL_BASED_OUTPATIENT_CLINIC_OR_DEPARTMENT_OTHER)
Admission: EM | Admit: 2023-07-02 | Discharge: 2023-07-02 | Disposition: A | Discharge status: Home / Self Care | Attending: Emergency Medicine | Admitting: Emergency Medicine

## 2023-07-02 VITALS — BP 107/65 | HR 97 | Temp 98.2°F | Ht 70.25 in | Wt 258.1 lb

## 2023-07-02 DIAGNOSIS — E1122 Type 2 diabetes mellitus with diabetic chronic kidney disease: Secondary | ICD-10-CM | POA: Insufficient documentation

## 2023-07-02 DIAGNOSIS — N1831 Chronic kidney disease, stage 3a: Secondary | ICD-10-CM

## 2023-07-02 DIAGNOSIS — R197 Diarrhea, unspecified: Secondary | ICD-10-CM | POA: Diagnosis not present

## 2023-07-02 DIAGNOSIS — N183 Chronic kidney disease, stage 3 unspecified: Secondary | ICD-10-CM | POA: Diagnosis not present

## 2023-07-02 DIAGNOSIS — R112 Nausea with vomiting, unspecified: Secondary | ICD-10-CM | POA: Diagnosis not present

## 2023-07-02 DIAGNOSIS — R051 Acute cough: Secondary | ICD-10-CM

## 2023-07-02 DIAGNOSIS — K529 Noninfective gastroenteritis and colitis, unspecified: Secondary | ICD-10-CM | POA: Insufficient documentation

## 2023-07-02 DIAGNOSIS — R0602 Shortness of breath: Secondary | ICD-10-CM

## 2023-07-02 DIAGNOSIS — E1159 Type 2 diabetes mellitus with other circulatory complications: Secondary | ICD-10-CM

## 2023-07-02 DIAGNOSIS — I129 Hypertensive chronic kidney disease with stage 1 through stage 4 chronic kidney disease, or unspecified chronic kidney disease: Secondary | ICD-10-CM | POA: Diagnosis not present

## 2023-07-02 DIAGNOSIS — Z7982 Long term (current) use of aspirin: Secondary | ICD-10-CM | POA: Insufficient documentation

## 2023-07-02 DIAGNOSIS — Z7951 Long term (current) use of inhaled steroids: Secondary | ICD-10-CM | POA: Insufficient documentation

## 2023-07-02 DIAGNOSIS — Z79899 Other long term (current) drug therapy: Secondary | ICD-10-CM | POA: Diagnosis not present

## 2023-07-02 DIAGNOSIS — E860 Dehydration: Secondary | ICD-10-CM | POA: Diagnosis not present

## 2023-07-02 DIAGNOSIS — R748 Abnormal levels of other serum enzymes: Secondary | ICD-10-CM | POA: Diagnosis not present

## 2023-07-02 DIAGNOSIS — R059 Cough, unspecified: Secondary | ICD-10-CM | POA: Diagnosis not present

## 2023-07-02 DIAGNOSIS — J45901 Unspecified asthma with (acute) exacerbation: Secondary | ICD-10-CM | POA: Diagnosis not present

## 2023-07-02 DIAGNOSIS — J449 Chronic obstructive pulmonary disease, unspecified: Secondary | ICD-10-CM | POA: Insufficient documentation

## 2023-07-02 DIAGNOSIS — Z87891 Personal history of nicotine dependence: Secondary | ICD-10-CM | POA: Insufficient documentation

## 2023-07-02 DIAGNOSIS — L0390 Cellulitis, unspecified: Secondary | ICD-10-CM

## 2023-07-02 DIAGNOSIS — R1110 Vomiting, unspecified: Secondary | ICD-10-CM | POA: Diagnosis not present

## 2023-07-02 DIAGNOSIS — K429 Umbilical hernia without obstruction or gangrene: Secondary | ICD-10-CM | POA: Diagnosis not present

## 2023-07-02 DIAGNOSIS — B9562 Methicillin resistant Staphylococcus aureus infection as the cause of diseases classified elsewhere: Secondary | ICD-10-CM

## 2023-07-02 DIAGNOSIS — Z7952 Long term (current) use of systemic steroids: Secondary | ICD-10-CM | POA: Diagnosis not present

## 2023-07-02 DIAGNOSIS — J4541 Moderate persistent asthma with (acute) exacerbation: Secondary | ICD-10-CM

## 2023-07-02 LAB — CBC WITH DIFFERENTIAL/PLATELET
Basophils Absolute: 0 10*3/uL (ref 0.0–0.1)
Basophils Relative: 0.2 % (ref 0.0–3.0)
Eosinophils Absolute: 0.1 10*3/uL (ref 0.0–0.7)
Eosinophils Relative: 0.8 % (ref 0.0–5.0)
HCT: 43.6 % (ref 39.0–52.0)
Hemoglobin: 14.4 g/dL (ref 13.0–17.0)
Lymphocytes Relative: 11.5 % — ABNORMAL LOW (ref 12.0–46.0)
Lymphs Abs: 1.5 10*3/uL (ref 0.7–4.0)
MCHC: 32.9 g/dL (ref 30.0–36.0)
MCV: 96.3 fl (ref 78.0–100.0)
Monocytes Absolute: 1 10*3/uL (ref 0.1–1.0)
Monocytes Relative: 8.1 % (ref 3.0–12.0)
Neutro Abs: 10.2 10*3/uL — ABNORMAL HIGH (ref 1.4–7.7)
Neutrophils Relative %: 79.4 % — ABNORMAL HIGH (ref 43.0–77.0)
Platelets: 315 10*3/uL (ref 150.0–400.0)
RBC: 4.53 Mil/uL (ref 4.22–5.81)
RDW: 14.6 % (ref 11.5–15.5)
WBC: 12.9 10*3/uL — ABNORMAL HIGH (ref 4.0–10.5)

## 2023-07-02 LAB — COMPREHENSIVE METABOLIC PANEL WITH GFR
ALT: 18 U/L (ref 0–53)
AST: 17 U/L (ref 0–37)
Albumin: 4.3 g/dL (ref 3.5–5.2)
Alkaline Phosphatase: 119 U/L — ABNORMAL HIGH (ref 39–117)
BUN: 54 mg/dL — ABNORMAL HIGH (ref 6–23)
CO2: 20 meq/L (ref 19–32)
Calcium: 9.3 mg/dL (ref 8.4–10.5)
Chloride: 105 meq/L (ref 96–112)
Creatinine, Ser: 3.33 mg/dL — ABNORMAL HIGH (ref 0.40–1.50)
GFR: 19.05 mL/min — ABNORMAL LOW (ref >60.00)
Glucose, Bld: 120 mg/dL — ABNORMAL HIGH (ref 70–99)
Potassium: 5.1 meq/L (ref 3.5–5.1)
Sodium: 131 meq/L — ABNORMAL LOW (ref 135–145)
Total Bilirubin: 0.7 mg/dL (ref 0.2–1.2)
Total Protein: 7.2 g/dL (ref 6.0–8.3)

## 2023-07-02 LAB — LIPASE: Lipase: 82 U/L — ABNORMAL HIGH (ref 11.0–59.0)

## 2023-07-02 MED ORDER — LACTATED RINGERS IV BOLUS
1000.0000 mL | Freq: Once | INTRAVENOUS | Status: AC
  Administered 2023-07-02: 1000 mL via INTRAVENOUS

## 2023-07-02 MED ORDER — SODIUM CHLORIDE 0.9 % IV BOLUS
1000.0000 mL | Freq: Once | INTRAVENOUS | Status: AC
  Administered 2023-07-02: 1000 mL via INTRAVENOUS

## 2023-07-02 MED ORDER — ONDANSETRON HCL 4 MG/2ML IJ SOLN
4.0000 mg | Freq: Once | INTRAMUSCULAR | Status: AC
  Administered 2023-07-02: 4 mg via INTRAVENOUS
  Filled 2023-07-02: qty 2

## 2023-07-02 MED ORDER — METHYLPREDNISOLONE ACETATE 80 MG/ML IJ SUSP
80.0000 mg | Freq: Once | INTRAMUSCULAR | Status: AC
  Administered 2023-07-02: 80 mg via INTRAMUSCULAR

## 2023-07-02 MED ORDER — ONDANSETRON 4 MG PO TBDP: 4.0000 mg | ORAL_TABLET | Freq: Three times a day (TID) | ORAL | 0 refills | Status: DC | PRN

## 2023-07-02 NOTE — Assessment & Plan Note (Addendum)
 Unclear etiology, possible viral infection.  Possibly due to constant coughing from asthma.  Patient does report some reflux of acid into his mouth. Encouraged him to increase omeprazole up to 40 mg daily  In the office he was able to keep down 8 ounces of water and reports normal urine output.  Will evaluate labs for electrolyte disturbance or evidence of dehydration.

## 2023-07-02 NOTE — Assessment & Plan Note (Addendum)
 Has not checked blood sugar lately.  Stop semaglutide  as this could be contributing to his vomiting and diarrhea.  Will also check lipase to rule out pancreatitis.

## 2023-07-02 NOTE — ED Triage Notes (Signed)
 Sent from [facility]CP. N/V/D several days. Afebrile. Labs drawn and available in [facility]. Hyponatremia, elevated kidney labs. COVID FLU pending.

## 2023-07-02 NOTE — Patient Instructions (Addendum)
 Hold semaglutide , stop codeine cough med.  Increase omeprazole to 40 mg daily  Can use zofran sublingual for nausea.  Gradually increase fluids, but if not keeping down  liquids in the next 24 hours.Aaron Aas go to ER.  We will call with lab results and respiratory virus panel.

## 2023-07-02 NOTE — Discharge Instructions (Addendum)
 Take Zofran every 6-8 hours.  Drink plenty of water over the next few days.  Try to eat foods that are gentle in your stomach such as soups and broths.  Follow-up with your primary care doctor within 1 week for repeat blood work.  Return to the emergency room if you develop inability eat or drink, worsening pain in your abdomen shortness of breath fever or confusion.

## 2023-07-02 NOTE — ED Provider Notes (Signed)
 [place] [facility]ENC[facility] DE[facility]MENT AT [facility] Provider Note  CSN: [id number] Arrival date & time: 07/02/23 1358  Chief Complaint(s) Emesis and Diarrhea  HPI Richard Giles is a 63 y.o. male who is here today for nausea vomiting diarrhea over the last 3 days.  Patient went to his PCP today, who drew blood work.  They recommend the patient come to the emergency department after the concerned about his renal function.  Patient has a history of chronic kidney disease.  He does take Ozempic .  He has not had any significant changes in his dosages.   Past Medical History Past Medical History:  Diagnosis Date   Allergy    allergy workup with Dr. 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   COPD (chronic obstructive pulmonary disease) (HCC)    per pt 01-04-15   Diabetes mellitus without complication (HCC)    Dysplastic nevus of trunk 02/04/2018   GERD (gastroesophageal reflux disease)    Hyperlipidemia    Hypertension    Restless leg syndrome    Sleep apnea    does not have to use cpap   Patient Active Problem List   Diagnosis Date Noted   SOB (shortness of breath) 07/02/2023   Vomiting and diarrhea 07/02/2023   Acute dehydration 07/02/2023   MRSA (methicillin resistant staph aureus) culture positive 06/04/2023   MRSA cellulitis 06/01/2023   Abdominal wall cellulitis 05/28/2023   BPV (benign positional vertigo) 08/18/2022   Moderate persistent asthma with acute exacerbation 11/19/2020   CKD (chronic kidney disease), stage III (HCC) 02/20/2018   Dysplastic nevus of trunk 02/04/2018    Class: History of   RLS (restless legs syndrome) 12/17/2015   Class 2 severe obesity due to excess calories with serious comorbidity and body mass index (BMI) of 35.0 to 35.9 in  adult (HCC) 12/17/2015   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 Authorizing Provider  ACCU-CHEK GUIDE test strip TEST BLOOD SUGAR EVERY DAY 08/14/20   Cherlyn Cornet, Amy E, MD  Accu-Chek Softclix Lancets lancets TEST BLOOD SUGAR ONCE DAILY 05/13/20   [provider]  acetaminophen  (TYLENOL ) 500 MG tablet Take 1,000 mg by mouth every 6 (six) hours as needed (pain).    [provider]  albuterol  (PROVENTIL ) (2.5 MG/3ML) 0.083% nebulizer solution Take 3 mLs (2.5 mg total) by nebulization every 6 (six) hours as needed for wheezing or shortness of breath. 05/26/22   Bedsole, Amy E, MD  albuterol  (VENTOLIN  HFA) 108 (90 Base) MCG/ACT inhaler Inhale 2 puffs into the lungs every 4 (four) hours as needed for wheezing or shortness of breath. 11/19/20   Bedsole, Amy E, MD  allopurinol  (ZYLOPRIM ) 300 MG tablet TAKE 1 TABLET BY MOUTH EVERY DAY 10/12/22   Cherlyn Cornet, Amy  E, MD  aspirin 81 MG tablet Take 81 mg by mouth daily.    [provider]  atorvastatin  (LIPITOR) 40 MG tablet TAKE 1 TABLET BY MOUTH EVERY DAY 04/21/23   Bedsole, Amy E, MD  Blood Glucose Monitoring Suppl (ACCU-CHEK GUIDE ME) w/Device KIT daily. 05/13/20   [provider]  budesonide -formoterol  (SYMBICORT ) 80-4.5 MCG/ACT inhaler Take 2 puffs first thing in am and then another 2 puffs about 12 hours later.Take 2 puffs first thing in am and then another 2 puffs about 12 hours later.  07/23/22   Diamond Formica, MD  cetirizine  (ZYRTEC ) 10 MG tablet Take 1 tablet (10 mg total) by mouth daily. 01/28/18   Nche, Connye Delaine, NP  Cholecalciferol (VITAMIN D3 PO) Take 1 tablet by mouth daily.    [provider]  cyclobenzaprine  (FLEXERIL ) 10 MG tablet Take 1 tablet (10 mg total) by mouth 3 (three) times daily as needed for muscle spasms. 04/09/17   Steven Elam, FNP  EPINEPHrine  0.3 mg/0.3 mL IJ SOAJ injection Inject 0.3 mg into the muscle as needed for anaphylaxis. 02/12/22   Bedsole, Amy E, MD  famotidine  (PEPCID ) 20 MG tablet TAKE 1 TABLET BY MOUTH EVERY DAY AFTER SUPPER 05/20/23   [name], Michael B, MD  fexofenadine (ALLEGRA) 180 MG tablet Take 180 mg by mouth daily.     [provider]  FLUoxetine  (PROZAC ) 20 MG capsule TAKE 1 CAPSULE BY MOUTH EVERY DAY 04/21/23   Bedsole, Amy E, MD  fluticasone  (FLONASE ) 50 MCG/ACT nasal spray SPRAY 2 SPRAYS INTO EACH NOSTRIL EVERY DAY 09/11/22   Bedsole, Amy E, MD  furosemide  (LASIX ) 20 MG tablet TAKE 1 TABLET BY MOUTH EVERY DAY AS NEEDED 05/19/23   Bedsole, Amy E, MD  irbesartan  (AVAPRO ) 150 MG tablet TAKE 1 TABLET BY MOUTH EVERY DAY 10/15/22   Bedsole, Amy E, MD  JARDIANCE  25 MG TABS tablet TAKE 1 TABLET BY MOUTH EVERY DAY BEFORE BREAKFAST 06/08/23   Bedsole, Amy E, MD  meclizine  (ANTIVERT ) 25 MG tablet Take 1 tablet (25 mg total) by mouth 3 (three) times daily as needed for dizziness. Caution of sedation 08/18/22   Tower, Manley Seeds, MD  montelukast  (SINGULAIR ) 10 MG tablet TAKE 1 TABLET BY MOUTH EVERYDAY AT BEDTIME 05/10/23   Bedsole, Amy E, MD  ondansetron (ZOFRAN-ODT) 4 MG disintegrating tablet Take 1 tablet (4 mg total) by mouth every 8 (eight) hours as needed for nausea or vomiting. 07/02/23   Bedsole, Amy E, MD  pantoprazole  (PROTONIX ) 40 MG tablet Take 1 tablet (40 mg total) by mouth daily. Take 30-60 min before first meal of the day 07/02/22   Diamond Formica, MD  predniSONE  (DELTASONE ) 20 MG tablet 3 tabs by mouth daily x 3 days,  then 2 tabs by mouth daily x 2 days then 1 tab by mouth daily x 2 days 06/25/23   Judithann Novas, MD  Probiotic Product (PROBIOTIC PO) Take 1 tablet by mouth daily.    [provider]  Semaglutide , 2 MG/DOSE, (OZEMPIC , 2 MG/DOSE,) 8 MG/3ML SOPN Inject 2 mg into the skin once a week. 10/01/22   Bedsole, Amy E, MD  sulfamethoxazole -trimethoprim  (BACTRIM  DS) 800-160 MG tablet Take 2 tablets by mouth 2 (two) times daily. 06/18/23   Judithann Novas, MD  Past Surgical History Past Surgical History:  Procedure Laterality Date   bialteral inguinal hernia  1967   COLONOSCOPY  01/14/2015   FOOT SURGERY  09/03/2003   Left foot osteophye removal (Dr. Rozelle Corning)   POLYPECTOMY     SEPTOPLASTY  1983   at 63 years of age   Family History Family History  Problem Relation Age of Onset   Hypertension Father    Colon polyps Father    Diabetes Maternal Grandmother    Parkinson's disease Mother    Depression Neg Hx    Drug abuse Neg Hx    Stroke Neg Hx    Colon cancer Neg Hx    Stomach cancer Neg Hx    Esophageal cancer Neg Hx    Rectal cancer Neg Hx     Social History Social History   Tobacco Use   Smoking status: Never   Smokeless tobacco: Former    Quit date: 02/23/1993  Vaping Use   Vaping status: Never Used  Substance Use Topics   Alcohol use: No   Drug use: No   Allergies Bee venom, Hornet venom, Lisinopril, and Codeine  Review of Systems Review of Systems  Physical Exam Vital Signs  I have reviewed the triage vital signs BP 123/76   Pulse 80   Temp 97.8 F (36.6 C) (Oral)   Resp 16   SpO2 98%   Physical Exam Vitals reviewed.  HENT:     Head: Normocephalic.  Cardiovascular:     Rate and Rhythm: Normal rate.  Pulmonary:     Effort: Pulmonary effort is normal.  Abdominal:     General: Abdomen is flat. There is no distension.     Palpations:  Abdomen is soft. There is no mass.     Tenderness: There is no abdominal tenderness. There is no guarding.  Neurological:     Mental Status: He is alert.     ED Results and Treatments Labs (all labs ordered are listed, but only abnormal results are displayed) Labs Reviewed - No data to display                                                                                                                        Radiology CT ABDOMEN PELVIS WO CONTRAST Result Date: 07/02/2023 CLINICAL DATA:  Possible pancreatitis.  Elevated lipase. EXAM: CT ABDOMEN AND PELVIS WITHOUT CONTRAST TECHNIQUE: Multidetector CT imaging of the abdomen and pelvis was performed following the standard protocol without IV contrast. RADIATION DOSE REDUCTION: This exam was performed according to the departmental dose-optimization program which includes automated exposure control, adjustment of the mA and/or kV according to patient size and/or use of iterative reconstruction technique. COMPARISON:  None Available. FINDINGS: Lower chest: No acute abnormality. Hepatobiliary: No focal liver abnormality is seen. No gallstones, gallbladder wall thickening, or biliary dilatation. Pancreas: Unremarkable. No pancreatic ductal dilatation or surrounding inflammatory changes. Spleen: Normal in size without focal abnormality. Adrenals/Urinary Tract: Adrenal glands are unremarkable. Kidneys are normal, without renal  calculi, focal lesion, or hydronephrosis. Bladder is unremarkable. Stomach/Bowel: Stomach is within normal limits. Appendix appears normal. No evidence of bowel wall thickening, distention, or inflammatory changes. Vascular/Lymphatic: Aortic atherosclerosis. No enlarged abdominal or pelvic lymph nodes. Reproductive: Prostate is unremarkable. Other: Small fat containing periumbilical hernia.  No ascites. Musculoskeletal: No acute or significant osseous findings. IMPRESSION: Small fat containing periumbilical hernia. No acute abnormality  seen in the abdomen or pelvis. Aortic Atherosclerosis (ICD10-I70.0). Electronically Signed   By: Rosalene Colon M.D.   On: 07/02/2023 16:43   DG Chest 2 View Result Date: 07/02/2023 CLINICAL DATA:  Cough and shortness of breath EXAM: CHEST - 2 VIEW COMPARISON:  Chest radiograph 05/26/2022 FINDINGS: The heart size and mediastinal contours are within normal limits. Both lungs are clear. The visualized skeletal structures are unremarkable. IMPRESSION: No active cardiopulmonary disease. Electronically Signed   By: Jone Neither M.D.   On: 07/02/2023 11:34    Pertinent labs & imaging results that were available during my care of the patient were reviewed by me and considered in my medical decision making (see MDM for details).  Medications Ordered in ED Medications  sodium chloride  0.9 % bolus 1,000 mL (1,000 mLs Intravenous New Bag/Given 07/02/23 1626)  lactated ringers bolus 1,000 mL (1,000 mLs Intravenous New Bag/Given 07/02/23 1720)  ondansetron (ZOFRAN) injection 4 mg (4 mg Intravenous Given 07/02/23 1714)                                                                                                                                     Procedures Procedures  (including critical care time)  Medical Decision Making / ED Course   This patient presents to the ED for concern of nausea vomiting diarrhea, this involves an extensive number of treatment options, and is a complaint that carries with it a high risk of complications and morbidity.  The differential diagnosis includes enteritis, gastroenteritis, dehydration, pancreatitis, less likely ACS.  MDM: On exam, patient overall well appearing.  He has soft abdomen, normal vital signs.  He is afebrile.  I reviewed the patient's labs from his PCP office.  He does have an increase in his creatinine.  Will provide patient with some IV fluids here in the emergency room.  Will plan to p.o. challenge the patient.  Obtain CT imaging on the patient which did  not show any inflammation of his pancreas.  He did not have any significant tenderness in his epigastrium.  Lipase was mildly elevated, however do not believe this represents an acute pancreatitis.  Reassessment 6:30 PM-patient tolerating p.o. well.  He has received 2 L of IV fluids.  Not having any nausea at this time.  Patient will me that his wife is currently in the hospital for COVID.  Will discharge patient.  His PCP is already sent in a prescription for Zofran that he is already picked up.  Return precautions discussed extensively with patient and  daughter at bedside.   Additional history obtained: -Additional history obtained from daughter at bedside -External records from outside source obtained and reviewed including: Chart review including previous notes, labs, imaging, consultation notes   Lab Tests: -I ordered, reviewed, and interpreted labs.   The pertinent results include:   Labs Reviewed - No data to display    EKG my independent review of the patient's EKG shows no ST segment depressions or elevations, no T wave inversions, no evidence of acute ischemia.  EKG Interpretation Date/Time:  Friday Jul 02 2023 17:13:14 EDT Ventricular Rate:  78 PR Interval:  166 QRS Duration:  105 QT Interval:  361 QTC Calculation: 412 R Axis:   58  Text Interpretation: Sinus rhythm Low voltage, precordial leads RSR' in V1 or V2, right VCD or RVH Confirmed by Afton Horse [phone]) on 07/02/2023 6:32:50 PM         Imaging Studies ordered: I ordered imaging studies including CT imaging of the abdomen pelvis I independently visualized and interpreted imaging. I agree with the radiologist interpretation   Medicines ordered and prescription drug management: Meds ordered this encounter  Medications   sodium chloride  0.9 % bolus 1,000 mL   lactated ringers bolus 1,000 mL   ondansetron (ZOFRAN) injection 4 mg    -I have reviewed the patients home medicines and have made adjustments as  needed   Cardiac Monitoring: The patient was maintained on a cardiac monitor.  I personally viewed and interpreted the cardiac monitored which showed an underlying rhythm of: Normal sinus rhythm  Social Determinants of Health:  Factors impacting patients care include: Multiple medical comorbidities including chronic kidney disease   Reevaluation: After the interventions noted above, I reevaluated the patient and found that they have :improved  Co morbidities that complicate the patient evaluation  Past Medical History:  Diagnosis Date   Allergy    allergy workup with Dr. 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 and is appropriate for outpatient follow-up.     Final Clinical Impression(s) / ED Diagnoses Final diagnoses:  Gastroenteritis     @PCDICTATION @    Afton Horse T, DO 07/02/23 1835

## 2023-07-02 NOTE — Assessment & Plan Note (Signed)
 Encouraged patient to push liquids gradually.  Prescription for Zofran sublingual given to use as needed.  ER precautions provided if he does not continue to keep down fluids in the next 24 hours.

## 2023-07-02 NOTE — Assessment & Plan Note (Signed)
 Recent abdominal wall cellulitis, now appears improved but he does still have indurated tissue on abdomen.

## 2023-07-02 NOTE — Assessment & Plan Note (Signed)
 Acute  Minimal improvement with prednisone  taper. Will evaluate with chest x-ray to rule out pneumonia.  Check respiratory panel. Evaluate with CBC.  Chest x-ray shows no evidence of acute cardiopulmonary disease. Patient given injection of Depo-Medrol  80 mg IM in office today.  He will continue using albuterol  as needed and continue Symbicort  2 puffs twice daily.

## 2023-07-02 NOTE — Progress Notes (Addendum)
 Patient ID: Richard Giles, male    DOB: 1960/11/02, 63 y.o.   MRN: [id number]  This visit was conducted in person.  BP 107/65   Pulse 97   Temp 98.2 F (36.8 C) (Temporal)   Ht 5' 10.25" (1.784 m)   Wt 258 lb 2 oz (117.1 kg)   SpO2 98%   BMI 36.77 kg/m    CC:  Chief Complaint  Patient presents with   Vomiting   Diarrhea    Subjective:   HPI: Richard Giles is a 63 y.o. male with type 2 diabetes and asthma presenting on 07/02/2023 for Vomiting and Diarrhea  Recently treated for asthma exacerbation with prednisone  taper on Jun 25, 2023. Prior to that treated April 4 and April 11 for abdominal wall cellulitis, MRSA... Treated with initially doxycycline  then changed to Bactrim . Completed second Bactrim  course on 5 days ago... has much less redness on abdomen but still form tissue underneath.  Today he reports new onset vomiting in the last 2-3 days.  Occurring off and on.  Keeping down clear until last 24 hours... now only ice chips last night.   Now diarrhea in last 24 hours. Last emesis 10 PM last night..none this AM.  Nml UOP this AM. This morning had small amount of sprite.   He continues to have significant cough and wheeze.  He is short of breath.   Has not been using an nausea meds.  On semaglutide  for DM.Aaron Aas took last dose 5 days ago.  FBS this AM  BP Readings from Last 3 Encounters:  07/02/23 107/65  06/25/23 120/64  06/04/23 110/60   His wife is currently admitted in hospital for heart failure.     Patient able to keep down 8 oz water in office... recheck Relevant past medical, surgical, family and social history reviewed and updated as indicated. Interim medical history since our last visit reviewed. Allergies and medications reviewed and updated. Outpatient Medications Prior to Visit  Medication Sig Dispense Refill   ACCU-CHEK GUIDE test strip TEST BLOOD SUGAR EVERY DAY 100 strip 3   Accu-Chek Softclix Lancets lancets TEST BLOOD SUGAR ONCE DAILY      acetaminophen  (TYLENOL ) 500 MG tablet Take 1,000 mg by mouth every 6 (six) hours as needed (pain).     albuterol  (PROVENTIL ) (2.5 MG/3ML) 0.083% nebulizer solution Take 3 mLs (2.5 mg total) by nebulization every 6 (six) hours as needed for wheezing or shortness of breath. 150 mL 1   albuterol  (VENTOLIN  HFA) 108 (90 Base) MCG/ACT inhaler Inhale 2 puffs into the lungs every 4 (four) hours as needed for wheezing or shortness of breath. 1 each 3   allopurinol  (ZYLOPRIM ) 300 MG tablet TAKE 1 TABLET BY MOUTH EVERY DAY 90 tablet 3   aspirin 81 MG tablet Take 81 mg by mouth daily.     atorvastatin  (LIPITOR) 40 MG tablet TAKE 1 TABLET BY MOUTH EVERY DAY 90 tablet 3   Blood Glucose Monitoring Suppl (ACCU-CHEK GUIDE ME) w/Device KIT daily.     budesonide -formoterol  (SYMBICORT ) 80-4.5 MCG/ACT inhaler Take 2 puffs first thing in am and then another 2 puffs about 12 hours later.Take 2 puffs first thing in am and then another 2 puffs about 12 hours later. 1 each 11   cetirizine  (ZYRTEC ) 10 MG tablet Take 1 tablet (10 mg total) by mouth daily. 14 tablet 0   Cholecalciferol (VITAMIN D3 PO) Take 1 tablet by mouth daily.     cyclobenzaprine  (FLEXERIL ) 10 MG tablet Take  1 tablet (10 mg total) by mouth 3 (three) times daily as needed for muscle spasms. 20 tablet 0   EPINEPHrine  0.3 mg/0.3 mL IJ SOAJ injection Inject 0.3 mg into the muscle as needed for anaphylaxis. 1 each 1   famotidine  (PEPCID ) 20 MG tablet TAKE 1 TABLET BY MOUTH EVERY DAY AFTER SUPPER 90 tablet 3   fexofenadine (ALLEGRA) 180 MG tablet Take 180 mg by mouth daily.      FLUoxetine  (PROZAC ) 20 MG capsule TAKE 1 CAPSULE BY MOUTH EVERY DAY 90 capsule 1   fluticasone  (FLONASE ) 50 MCG/ACT nasal spray SPRAY 2 SPRAYS INTO EACH NOSTRIL EVERY DAY 48 mL 0   furosemide  (LASIX ) 20 MG tablet TAKE 1 TABLET BY MOUTH EVERY DAY AS NEEDED 90 tablet 1   irbesartan  (AVAPRO ) 150 MG tablet TAKE 1 TABLET BY MOUTH EVERY DAY 90 tablet 3   JARDIANCE  25 MG TABS tablet TAKE 1  TABLET BY MOUTH EVERY DAY BEFORE BREAKFAST 90 tablet 0   meclizine  (ANTIVERT ) 25 MG tablet Take 1 tablet (25 mg total) by mouth 3 (three) times daily as needed for dizziness. Caution of sedation 30 tablet 0   montelukast  (SINGULAIR ) 10 MG tablet TAKE 1 TABLET BY MOUTH EVERYDAY AT BEDTIME 90 tablet 3   pantoprazole  (PROTONIX ) 40 MG tablet Take 1 tablet (40 mg total) by mouth daily. Take 30-60 min before first meal of the day 30 tablet 2   predniSONE  (DELTASONE ) 20 MG tablet 3 tabs by mouth daily x 3 days, then 2 tabs by mouth daily x 2 days then 1 tab by mouth daily x 2 days 15 tablet 0   Probiotic Product (PROBIOTIC PO) Take 1 tablet by mouth daily.     Semaglutide , 2 MG/DOSE, (OZEMPIC , 2 MG/DOSE,) 8 MG/3ML SOPN Inject 2 mg into the skin once a week. 9 mL 3   sulfamethoxazole -trimethoprim  (BACTRIM  DS) 800-160 MG tablet Take 2 tablets by mouth 2 (two) times daily. 40 tablet 0   No facility-administered medications prior to visit.     Per HPI unless specifically indicated in ROS section below Review of Systems  Constitutional:  Positive for fatigue. Negative for fever.  HENT:  Negative for ear pain.   Eyes:  Negative for pain.  Respiratory:  Negative for cough and shortness of breath.   Cardiovascular:  Negative for chest pain, palpitations and leg swelling.  Gastrointestinal:  Positive for diarrhea and vomiting. Negative for abdominal pain.  Genitourinary:  Negative for dysuria.  Musculoskeletal:  Negative for arthralgias.  Neurological:  Negative for syncope, light-headedness and headaches.  Psychiatric/Behavioral:  Negative for dysphoric mood.    Objective:  BP 107/65   Pulse 97   Temp 98.2 F (36.8 C) (Temporal)   Ht 5' 10.25" (1.784 m)   Wt 258 lb 2 oz (117.1 kg)   SpO2 98%   BMI 36.77 kg/m   Wt Readings from Last 3 Encounters:  07/02/23 258 lb 2 oz (117.1 kg)  06/25/23 268 lb 6 oz (121.7 kg)  06/04/23 268 lb 8 oz (121.8 kg)      Physical Exam Vitals reviewed.   Constitutional:      Appearance: He is well-developed.  HENT:     Head: Normocephalic.     Right Ear: Hearing normal.     Left Ear: Hearing normal.     Nose: Nose normal.  Neck:     Thyroid: No thyroid mass or thyromegaly.     Vascular: No carotid bruit.     Trachea: Trachea normal.  Cardiovascular:     Rate and Rhythm: Normal rate and regular rhythm.     Pulses: Normal pulses.     Heart sounds: Heart sounds not distant. No murmur heard.    No friction rub. No gallop.     Comments: No peripheral edema Pulmonary:     Effort: Pulmonary effort is normal. No respiratory distress.     Breath sounds: Examination of the right-middle field reveals rhonchi. Examination of the left-middle field reveals rhonchi. Examination of the right-lower field reveals rhonchi. Examination of the left-lower field reveals rhonchi. Rhonchi present.  Abdominal:     Tenderness: There is abdominal tenderness in the right lower quadrant. There is no right CVA tenderness or left CVA tenderness.  Skin:    General: Skin is warm and dry.     Findings: No rash.  Neurological:     Mental Status: He is lethargic.  Psychiatric:        Speech: Speech normal.        Behavior: Behavior normal.        Thought Content: Thought content normal.       Results for orders placed or performed in visit on 07/02/23  CBC with Differential/Platelet   Collection Time: 07/02/23 10:44 AM  Result Value Ref Range   WBC 12.9 (H) 4.0 - 10.5 K/uL   RBC 4.53 4.22 - 5.81 Mil/uL   Hemoglobin 14.4 13.0 - 17.0 g/dL   HCT 16.1 09.6 - 04.5 %   MCV 96.3 78.0 - 100.0 fl   MCHC 32.9 30.0 - 36.0 g/dL   RDW 40.9 81.1 - 91.4 %   Platelets 315.0 150.0 - 400.0 K/uL   Neutrophils Relative % 79.4 (H) 43.0 - 77.0 %   Lymphocytes Relative 11.5 (L) 12.0 - 46.0 %   Monocytes Relative 8.1 3.0 - 12.0 %   Eosinophils Relative 0.8 0.0 - 5.0 %   Basophils Relative 0.2 0.0 - 3.0 %   Neutro Abs 10.2 (H) 1.4 - 7.7 K/uL   Lymphs Abs 1.5 0.7 - 4.0 K/uL    Monocytes Absolute 1.0 0.1 - 1.0 K/uL   Eosinophils Absolute 0.1 0.0 - 0.7 K/uL   Basophils Absolute 0.0 0.0 - 0.1 K/uL  Comprehensive metabolic panel with GFR   Collection Time: 07/02/23 10:44 AM  Result Value Ref Range   Sodium 131 (L) 135 - 145 mEq/L   Potassium 5.1 3.5 - 5.1 mEq/L   Chloride 105 96 - 112 mEq/L   CO2 20 19 - 32 mEq/L   Glucose, Bld 120 (H) 70 - 99 mg/dL   BUN 54 (H) 6 - 23 mg/dL   Creatinine, Ser 7.82 (H) 0.40 - 1.50 mg/dL   Total Bilirubin 0.7 0.2 - 1.2 mg/dL   Alkaline Phosphatase 119 (H) 39 - 117 U/L   AST 17 0 - 37 U/L   ALT 18 0 - 53 U/L   Total Protein 7.2 6.0 - 8.3 g/dL   Albumin 4.3 3.5 - 5.2 g/dL   GFR 95.62 (L) >13.08 mL/min   Calcium  9.3 8.4 - 10.5 mg/dL  Lipase   Collection Time: 07/02/23 10:44 AM  Result Value Ref Range   Lipase 82.0 (H) 11.0 - 59.0 U/L    Assessment and Plan  SOB (shortness of breath) -     CBC with Differential/Platelet -     Comprehensive metabolic panel with GFR -     DG Chest 2 View -     Lipase -     Respiratory virus  panel; Future -     methylPREDNISolone  Acetate -     COVID-19, Flu A+B and RSV  Vomiting and diarrhea Assessment & Plan: Unclear etiology, possible viral infection.  Possibly due to constant coughing from asthma.  Patient does report some reflux of acid into his mouth. Encouraged him to increase omeprazole up to 40 mg daily  In the office he was able to keep down 8 ounces of water and reports normal urine output.  Will evaluate labs for electrolyte disturbance or evidence of dehydration.  Orders: -     CBC with Differential/Platelet -     Comprehensive metabolic panel with GFR -     ZOXWR-60, Flu A+B and RSV  Acute cough Assessment & Plan:  Acute  Minimal improvement with prednisone  taper. Will evaluate with chest x-ray to rule out pneumonia.  Check respiratory panel. Evaluate with CBC.  Chest x-ray shows no evidence of acute cardiopulmonary disease. Patient given injection of  Depo-Medrol  80 mg IM in office today.  He will continue using albuterol  as needed and continue Symbicort  2 puffs twice daily.    Orders: -     methylPREDNISolone  Acetate -     COVID-19, Flu A+B and RSV  Acute dehydration Assessment & Plan: Encouraged patient to push liquids gradually.  Prescription for Zofran sublingual given to use as needed.  ER precautions provided if he does not continue to keep down fluids in the next 24 hours.  Orders: -     COVID-19, Flu A+B and RSV  Stage 3a chronic kidney disease (HCC)  Type 2 diabetes mellitus with other circulatory complications (HCC) Assessment & Plan: Has not checked blood sugar lately.  Stop semaglutide  as this could be contributing to his vomiting and diarrhea.   Moderate persistent asthma with acute exacerbation Assessment & Plan:  Acute  Minimal improvement with prednisone  taper. Will evaluate with chest x-ray to rule out pneumonia.  Check respiratory panel. Evaluate with CBC.  Chest x-ray shows no evidence of acute cardiopulmonary disease. Patient given injection of Depo-Medrol  80 mg IM in office today.  He will continue using albuterol  as needed and continue Symbicort  2 puffs twice daily.     MRSA cellulitis Assessment & Plan: Recent abdominal wall cellulitis, now appears improved but he does still have indurated tissue on abdomen.   Other orders -     Ondansetron; Take 1 tablet (4 mg total) by mouth every 8 (eight) hours as needed for nausea or vomiting.  Dispense: 20 tablet; Refill: 0  ADDENDUM: Upon lab review there is evidence for hyponatremia and acute renal failure.  Patient was instructed to go to [facility] for likely IV fluids. Of note he does have elevated white blood count with left shift..  This could be from the prednisone  but is concerning for bacterial infection.  Will ask ER to consider other etiologies of elevated white blood cell count including recent MRSA cellulitis on abdominal wall although  did appear improved on exam in office today. Lipase slightly elevated... Consider abdominal CT.   No follow-ups on file.   Herby Lolling, MD

## 2023-07-02 NOTE — ED Notes (Signed)
 Discharge instructions, follow up care, and medication reviewed and explained, pt verbalized understanding and had no further questions on d/c. Pt caox4, ambulatory, NAD on d/c.

## 2023-07-06 ENCOUNTER — Ambulatory Visit: Payer: Self-pay | Admitting: Family Medicine

## 2023-07-06 LAB — COVID-19, FLU A+B AND RSV
Influenza A, NAA: NOT DETECTED
Influenza B, NAA: NOT DETECTED
RSV, NAA: NOT DETECTED
SARS-CoV-2, NAA: NOT DETECTED

## 2023-07-27 DIAGNOSIS — M5412 Radiculopathy, cervical region: Secondary | ICD-10-CM | POA: Diagnosis not present

## 2023-07-27 DIAGNOSIS — R519 Headache, unspecified: Secondary | ICD-10-CM | POA: Diagnosis not present

## 2023-07-27 DIAGNOSIS — M9901 Segmental and somatic dysfunction of cervical region: Secondary | ICD-10-CM | POA: Diagnosis not present

## 2023-07-27 DIAGNOSIS — M9902 Segmental and somatic dysfunction of thoracic region: Secondary | ICD-10-CM | POA: Diagnosis not present

## 2023-08-23 ENCOUNTER — Other Ambulatory Visit: Payer: Self-pay | Admitting: Family Medicine

## 2023-08-24 ENCOUNTER — Encounter: Payer: Self-pay | Admitting: General Practice

## 2023-08-24 ENCOUNTER — Telehealth: Admitting: General Practice

## 2023-08-24 VITALS — Ht 70.25 in | Wt 268.0 lb

## 2023-08-24 DIAGNOSIS — T7840XA Allergy, unspecified, initial encounter: Secondary | ICD-10-CM

## 2023-08-24 MED ORDER — PREDNISONE 10 MG (21) PO TBPK: ORAL_TABLET | ORAL | 0 refills | Status: DC

## 2023-08-24 NOTE — Progress Notes (Signed)
 Virtual Visit via Video Note  I connected with Richard Giles on 08/24/23 at  3:40 PM EDT by a video enabled telemedicine application and verified that I am speaking with the correct person using two identifiers.  Patient Location: Home Provider Location: Home Office  I discussed the limitations, risks, security, and privacy concerns of performing an evaluation and management service by video and the availability of in person appointments. I also discussed with the patient that there may be a patient responsible charge related to this service. The patient expressed understanding and agreed to proceed.  Subjective: PCP: Avelina Greig BRAVO, MD  Chief Complaint  Patient presents with   Allergic Reaction    Having reaction to weed/grass after being on tractor yesterday. Patient having runny, itchy eyes; feels like lips may be swelling; no issues with breathing. Took doxycyline today he had on hand.    Allergic Reaction   Winfred Iiams is a 63 year old male, patient of Dr. Avelina, with past medical history of Asthma, type 2 DM, HTN, sleep apnea, HLD, presents today for an acute visit.   Allergic reaction: symptom onset yesterday after working outside yesterday on his tractor. He was working with weed/grass. He developed runny, itchy eyes and bilateral eye swelling and lip swelling. Denies any respiratory symptoms such as shortness of breath or difficulty breathing. He had some doxycyline left from before when he was asked to change his antibiotic for another treatment. He did take one of those with no relief. 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   ROS: Per HPI  Current Outpatient Medications:    ACCU-CHEK GUIDE test strip, TEST BLOOD SUGAR EVERY DAY, Disp: 100 strip, Rfl: 3   Accu-Chek Softclix Lancets lancets, TEST BLOOD SUGAR ONCE DAILY, Disp: , Rfl:    acetaminophen  (TYLENOL ) 500 MG tablet, Take 1,000 mg by mouth every 6 (six) hours as needed (pain)., Disp: , Rfl:     albuterol  (PROVENTIL ) (2.5 MG/3ML) 0.083% nebulizer solution, Take 3 mLs (2.5 mg total) by nebulization every 6 (six) hours as needed for wheezing or shortness of breath., Disp: 150 mL, Rfl: 1   albuterol  (VENTOLIN  HFA) 108 (90 Base) MCG/ACT inhaler, Inhale 2 puffs into the lungs every 4 (four) hours as needed for wheezing or shortness of breath., Disp: 1 each, Rfl: 3   allopurinol  (ZYLOPRIM ) 300 MG tablet, TAKE 1 TABLET BY MOUTH EVERY DAY, Disp: 90 tablet, Rfl: 3   aspirin 81 MG tablet, Take 81 mg by mouth daily., Disp: , Rfl:    atorvastatin  (LIPITOR) 40 MG tablet, TAKE 1 TABLET BY MOUTH EVERY DAY, Disp: 90 tablet, Rfl: 3   Blood Glucose Monitoring Suppl (ACCU-CHEK GUIDE ME) w/Device KIT, daily., Disp: , Rfl:    budesonide -formoterol  (SYMBICORT ) 80-4.5 MCG/ACT inhaler, Take 2 puffs first thing in am and then another 2 puffs about 12 hours later.Take 2 puffs first thing in am and then another 2 puffs about 12 hours later., Disp: 1 each, Rfl: 11   cetirizine  (ZYRTEC ) 10 MG tablet, Take 1 tablet (10 mg total) by mouth daily., Disp: 14 tablet, Rfl: 0   Cholecalciferol (VITAMIN D3 PO), Take 1 tablet by mouth daily., Disp: , Rfl:    cyclobenzaprine  (FLEXERIL ) 10 MG tablet, Take 1 tablet (10 mg total) by mouth 3 (three) times daily as needed for muscle spasms., Disp: 20 tablet, Rfl: 0   EPINEPHrine  0.3 mg/0.3 mL IJ SOAJ injection, INJECT 0.3 MG INTO THE MUSCLE AS NEEDED  FOR ANAPHYLAXIS., Disp: 2 each, Rfl: 0   famotidine  (PEPCID ) 20 MG tablet, TAKE 1 TABLET BY MOUTH EVERY DAY AFTER SUPPER, Disp: 90 tablet, Rfl: 3   fexofenadine (ALLEGRA) 180 MG tablet, Take 180 mg by mouth daily. , Disp: , Rfl:    FLUoxetine  (PROZAC ) 20 MG capsule, TAKE 1 CAPSULE BY MOUTH EVERY DAY, Disp: 90 capsule, Rfl: 1   fluticasone  (FLONASE ) 50 MCG/ACT nasal spray, SPRAY 2 SPRAYS INTO EACH NOSTRIL EVERY DAY, Disp: 48 mL, Rfl: 0   furosemide  (LASIX ) 20 MG tablet, TAKE 1 TABLET BY MOUTH EVERY DAY AS NEEDED, Disp: 90 tablet, Rfl: 1    irbesartan  (AVAPRO ) 150 MG tablet, TAKE 1 TABLET BY MOUTH EVERY DAY, Disp: 90 tablet, Rfl: 3   JARDIANCE  25 MG TABS tablet, TAKE 1 TABLET BY MOUTH EVERY DAY BEFORE BREAKFAST, Disp: 90 tablet, Rfl: 0   meclizine  (ANTIVERT ) 25 MG tablet, Take 1 tablet (25 mg total) by mouth 3 (three) times daily as needed for dizziness. Caution of sedation, Disp: 30 tablet, Rfl: 0   montelukast  (SINGULAIR ) 10 MG tablet, TAKE 1 TABLET BY MOUTH EVERYDAY AT BEDTIME, Disp: 90 tablet, Rfl: 3   ondansetron  (ZOFRAN -ODT) 4 MG disintegrating tablet, Take 1 tablet (4 mg total) by mouth every 8 (eight) hours as needed for nausea or vomiting., Disp: 20 tablet, Rfl: 0   pantoprazole  (PROTONIX ) 40 MG tablet, Take 1 tablet (40 mg total) by mouth daily. Take 30-60 min before first meal of the day, Disp: 30 tablet, Rfl: 2   predniSONE  (STERAPRED UNI-PAK 21 TAB) 10 MG (21) TBPK tablet, Please follow instructions on the package., Disp: 1 each, Rfl: 0   Probiotic Product (PROBIOTIC PO), Take 1 tablet by mouth daily., Disp: , Rfl:    Semaglutide , 2 MG/DOSE, (OZEMPIC , 2 MG/DOSE,) 8 MG/3ML SOPN, Inject 2 mg into the skin once a week., Disp: 9 mL, Rfl: 3  Observations/Objective: Today's Vitals   08/24/23 1516  Weight: 268 lb (121.6 kg)  Height: 5' 10.25 (1.784 m)   Physical Exam Nursing note reviewed.  Constitutional:      Appearance: Normal appearance.  HENT:     Mouth/Throat:      Comments: Upper lip swelling present on exam.  Eyes:     Conjunctiva/sclera: Conjunctivae normal.     Comments: Bilateral periorbital swelling present on exam.  Pulmonary:     Effort: Pulmonary effort is normal.   Neurological:     Mental Status: He is alert and oriented to person, place, and time.   Psychiatric:        Mood and Affect: Mood normal.        Behavior: Behavior normal.        Thought Content: Thought content normal.        Judgment: Judgment normal.     Assessment and Plan: Allergic reaction, initial encounter Assessment  & Plan: Symptoms suggestive of allergic reaction.  No respiratory distress on exam.  Bilateral periorbital swelling and upper lip swelling present on video visit.   Start prednisone  pack. Rx sent.  Start famotidine  20 mg once daily.  Start zyrtec  10 mg once daily at bedtime. Sedation precautions advised.  Use albuterol  as needed. Discussed to avoid doxycycline  at this time as the no infection on presentation.  F/u with PCP in one week or sooner if symptoms worsen or do not improve.  Discussed ER precautions at length. Verbalizes understanding.   Orders: -     predniSONE ; Please follow instructions on the package.  Dispense:  1 each; Refill: 0    Follow Up Instructions: Return if symptoms worsen or fail to improve.   I discussed the assessment and treatment plan with the patient. The patient was provided an opportunity to ask questions, and all were answered. The patient agreed with the plan and demonstrated an understanding of the instructions.   The patient was advised to call back or seek an in-person evaluation if the symptoms worsen or if the condition fails to improve as anticipated.  The above assessment and management plan was discussed with the patient. The patient verbalized understanding of and has agreed to the management plan.   Carrol Aurora, NP

## 2023-08-24 NOTE — Assessment & Plan Note (Addendum)
 Symptoms suggestive of allergic reaction.  No respiratory distress on exam.  Bilateral periorbital swelling and upper lip swelling present on video visit.   Start prednisone  pack. Rx sent.  Start famotidine  20 mg once daily.  Start zyrtec  10 mg once daily at bedtime. Sedation precautions advised.  Use albuterol  as needed. Discussed to avoid doxycycline  at this time as the no infection on presentation.  F/u with PCP in one week or sooner if symptoms worsen or do not improve.  Discussed ER precautions at length. Verbalizes understanding.

## 2023-08-24 NOTE — Patient Instructions (Addendum)
 Start prednisone  pack and follow instructions on the package.   Start daily zyrtec . Take it at bedtime as it can cause drowsiness.   Start pepcid  20 mg once daily for one week.   Use albuterol  as needed.  If you develop any breathing problems, symptoms worsen, please go to the ER.  It was a pleasure to see you today!

## 2023-09-04 ENCOUNTER — Other Ambulatory Visit: Payer: Self-pay | Admitting: Family Medicine

## 2023-09-16 ENCOUNTER — Other Ambulatory Visit: Payer: Self-pay | Admitting: Internal Medicine

## 2023-09-19 ENCOUNTER — Other Ambulatory Visit: Payer: Self-pay | Admitting: Family Medicine

## 2023-09-20 NOTE — Telephone Encounter (Signed)
 Spoke to pt, sch ov for 09/28/23

## 2023-09-20 NOTE — Telephone Encounter (Signed)
 Please schedule diabetes follow up with Dr. Avelina.

## 2023-09-28 ENCOUNTER — Encounter: Payer: Self-pay | Admitting: Family Medicine

## 2023-09-28 ENCOUNTER — Ambulatory Visit (INDEPENDENT_AMBULATORY_CARE_PROVIDER_SITE_OTHER): Admitting: Family Medicine

## 2023-09-28 VITALS — BP 110/64 | HR 91 | Temp 97.5°F | Ht 70.25 in | Wt 274.1 lb

## 2023-09-28 DIAGNOSIS — E1159 Type 2 diabetes mellitus with other circulatory complications: Secondary | ICD-10-CM | POA: Diagnosis not present

## 2023-09-28 DIAGNOSIS — E66812 Obesity, class 2: Secondary | ICD-10-CM | POA: Diagnosis not present

## 2023-09-28 DIAGNOSIS — Z23 Encounter for immunization: Secondary | ICD-10-CM

## 2023-09-28 DIAGNOSIS — Z7985 Long-term (current) use of injectable non-insulin antidiabetic drugs: Secondary | ICD-10-CM

## 2023-09-28 DIAGNOSIS — Z6835 Body mass index (BMI) 35.0-35.9, adult: Secondary | ICD-10-CM

## 2023-09-28 LAB — POCT GLYCOSYLATED HEMOGLOBIN (HGB A1C): Hemoglobin A1C: 5.9 % — AB (ref 4.0–5.6)

## 2023-09-28 NOTE — Assessment & Plan Note (Signed)
 Chronic, now back on semaglutide . Encouraged exercise, weight loss, healthy eating habits.

## 2023-09-28 NOTE — Assessment & Plan Note (Signed)
 Chronic, stable control.  Had stopped semaglutide  given N/V... has now restarted with no SE.  If symptoms recur will decrease dose.   Semaglutide  2 mg weekly.

## 2023-09-28 NOTE — Progress Notes (Signed)
 Patient ID: Richard Giles, male    DOB: [birth year], 63 y.o.   MRN: [id number]  This visit was conducted in person.  BP 110/64   Pulse 91   Temp (!) 97.5 F (36.4 C) (Temporal)   Ht 5' 10.25 (1.784 m)   Wt 274 lb 2 oz (124.3 kg)   SpO2 98%   BMI 39.05 kg/m    CC:  Chief Complaint  Patient presents with   Diabetes    Subjective:   HPI: Richard Giles is a 63 y.o. male presenting on 09/28/2023 for Diabetes  Hypertension:  Well-controlled Coreg  6.25 mg p.o. twice daily Irbesartan  500 mg p.o. daily Amlodipine  5 mg daily BP Readings from Last 3 Encounters:  09/28/23 110/64  07/02/23 123/76  07/02/23 107/65  Using medication without problems or lightheadedness:  none Chest pain with exertion: none Edema: none Short of breath:none Average home BPs: Other issues:    No recent gout flares.. on allopurinol  300 mg daily.  Elevated Cholesterol: Well-controlled on atorvastatin  40 mg daily Lab Results  Component Value Date   CHOL 101 03/04/2023   HDL 31.70 (L) 03/04/2023   LDLCALC 48 03/04/2023   LDLDIRECT 85.0 08/12/2020   TRIG 107.0 03/04/2023   CHOLHDL 3 03/04/2023  Using medications without problems: none Muscle aches: none Diet compliance: low carb Exercise:  none.. baseball starting soon Other complaints:  Diabetes: Well-controlled on semaglutide  2 mg   following weight loss ( was off for several months.. has started 2 weeks ago) Lab Results  Component Value Date   HGBA1C 5.9 (A) 09/28/2023  Using medications without difficulties: none Hypoglycemic episodes: Hyperglycemic episodes: Feet problems: no ulcer Blood Sugars averaging: eye exam within last year: due     Wt Readings from Last 3 Encounters:  09/28/23 274 lb 2 oz (124.3 kg)  08/24/23 268 lb (121.6 kg)  07/02/23 258 lb 2 oz (117.1 kg)     Relevant past medical, surgical, family and social history reviewed and updated as indicated. Interim medical history since our last visit  reviewed. Allergies and medications reviewed and updated. Outpatient Medications Prior to Visit  Medication Sig Dispense Refill   ACCU-CHEK GUIDE test strip TEST BLOOD SUGAR EVERY DAY 100 strip 3   Accu-Chek Softclix Lancets lancets TEST BLOOD SUGAR ONCE DAILY     acetaminophen  (TYLENOL ) 500 MG tablet Take 1,000 mg by mouth every 6 (six) hours as needed (pain).     albuterol  (PROVENTIL ) (2.5 MG/3ML) 0.083% nebulizer solution Take 3 mLs (2.5 mg total) by nebulization every 6 (six) hours as needed for wheezing or shortness of breath. 150 mL 1   albuterol  (VENTOLIN  HFA) 108 (90 Base) MCG/ACT inhaler Inhale 2 puffs into the lungs every 4 (four) hours as needed for wheezing or shortness of breath. 1 each 3   allopurinol  (ZYLOPRIM ) 300 MG tablet TAKE 1 TABLET BY MOUTH EVERY DAY 90 tablet 3   aspirin 81 MG tablet Take 81 mg by mouth daily.     atorvastatin  (LIPITOR) 40 MG tablet TAKE 1 TABLET BY MOUTH EVERY DAY 90 tablet 3   Blood Glucose Monitoring Suppl (ACCU-CHEK GUIDE ME) w/Device KIT daily.     budesonide -formoterol  (SYMBICORT ) 80-4.5 MCG/ACT inhaler TAKE 2 PUFFS FIRST THING IN AM AND THEN ANOTHER 2 PUFFS ABOUT 12 HOURS LATER. 30.6 each 0   cetirizine  (ZYRTEC ) 10 MG tablet Take 1 tablet (10 mg total) by mouth daily. 14 tablet 0   Cholecalciferol (VITAMIN D3 PO) Take 1 tablet by mouth daily.  cyclobenzaprine  (FLEXERIL ) 10 MG tablet Take 1 tablet (10 mg total) by mouth 3 (three) times daily as needed for muscle spasms. 20 tablet 0   empagliflozin  (JARDIANCE ) 25 MG TABS tablet TAKE 1 TABLET BY MOUTH EVERY DAY BEFORE BREAKFAST 90 tablet 1   EPINEPHrine  0.3 mg/0.3 mL IJ SOAJ injection INJECT 0.3 MG INTO THE MUSCLE AS NEEDED FOR ANAPHYLAXIS. 2 each 0   famotidine  (PEPCID ) 20 MG tablet TAKE 1 TABLET BY MOUTH EVERY DAY AFTER SUPPER 90 tablet 3   fexofenadine (ALLEGRA) 180 MG tablet Take 180 mg by mouth daily.      FLUoxetine  (PROZAC ) 20 MG capsule TAKE 1 CAPSULE BY MOUTH EVERY DAY 90 capsule 1    fluticasone  (FLONASE ) 50 MCG/ACT nasal spray SPRAY 2 SPRAYS INTO EACH NOSTRIL EVERY DAY 48 mL 0   furosemide  (LASIX ) 20 MG tablet TAKE 1 TABLET BY MOUTH EVERY DAY AS NEEDED 90 tablet 1   irbesartan  (AVAPRO ) 150 MG tablet TAKE 1 TABLET BY MOUTH EVERY DAY 90 tablet 3   meclizine  (ANTIVERT ) 25 MG tablet Take 1 tablet (25 mg total) by mouth 3 (three) times daily as needed for dizziness. Caution of sedation 30 tablet 0   montelukast  (SINGULAIR ) 10 MG tablet TAKE 1 TABLET BY MOUTH EVERYDAY AT BEDTIME 90 tablet 3   ondansetron  (ZOFRAN -ODT) 4 MG disintegrating tablet Take 1 tablet (4 mg total) by mouth every 8 (eight) hours as needed for nausea or vomiting. 20 tablet 0   pantoprazole  (PROTONIX ) 40 MG tablet Take 1 tablet (40 mg total) by mouth daily. Take 30-60 min before first meal of the day 30 tablet 2   Probiotic Product (PROBIOTIC PO) Take 1 tablet by mouth daily.     Semaglutide , 2 MG/DOSE, (OZEMPIC , 2 MG/DOSE,) 8 MG/3ML SOPN INJECT 2 MG INTO THE SKIN ONCE A WEEK. 3 mL 0   predniSONE  (STERAPRED UNI-PAK 21 TAB) 10 MG (21) TBPK tablet Please follow instructions on the package. 1 each 0   No facility-administered medications prior to visit.     Per HPI unless specifically indicated in ROS section below Review of Systems  Constitutional:  Negative for fatigue and fever.  HENT:  Positive for congestion. Negative for ear pain.   Eyes:  Negative for pain.  Respiratory:  Positive for cough and shortness of breath.   Cardiovascular:  Negative for chest pain, palpitations and leg swelling.  Gastrointestinal:  Negative for abdominal pain.  Genitourinary:  Negative for dysuria.  Musculoskeletal:  Negative for arthralgias.  Neurological:  Negative for syncope, light-headedness and headaches.  Psychiatric/Behavioral:  Negative for dysphoric mood.    Objective:  BP 110/64   Pulse 91   Temp (!) 97.5 F (36.4 C) (Temporal)   Ht 5' 10.25 (1.784 m)   Wt 274 lb 2 oz (124.3 kg)   SpO2 98%   BMI 39.05  kg/m   Wt Readings from Last 3 Encounters:  09/28/23 274 lb 2 oz (124.3 kg)  08/24/23 268 lb (121.6 kg)  07/02/23 258 lb 2 oz (117.1 kg)      Physical Exam Vitals reviewed.  Constitutional:      General: He is not in acute distress.    Appearance: Normal appearance. He is well-developed. He is not ill-appearing or toxic-appearing.  HENT:     Head: Normocephalic and atraumatic.     Right Ear: Hearing, tympanic membrane, ear canal and external ear normal.     Left Ear: Hearing, tympanic membrane, ear canal and external ear normal.  Nose: Nose normal.     Mouth/Throat:     Pharynx: Uvula midline.  Eyes:     General: Lids are normal. Lids are everted, no foreign bodies appreciated.     Conjunctiva/sclera: Conjunctivae normal.     Pupils: Pupils are equal, round, and reactive to light.  Neck:     Thyroid: No thyroid mass or thyromegaly.     Vascular: No carotid bruit.     Trachea: Trachea and phonation normal.  Cardiovascular:     Rate and Rhythm: Normal rate and regular rhythm.     Pulses: Normal pulses.     Heart sounds: S1 normal and S2 normal. Heart sounds not distant. No murmur heard.    No friction rub. No gallop.     Comments: No peripheral edema Pulmonary:     Effort: Pulmonary effort is normal. No respiratory distress.     Breath sounds: Examination of the right-middle field reveals rhonchi. Examination of the right-lower field reveals rhonchi. Rhonchi present. No decreased breath sounds, wheezing or rales.  Abdominal:     General: Bowel sounds are normal.     Palpations: Abdomen is soft.     Tenderness: There is no abdominal tenderness. There is no guarding or rebound.     Hernia: No hernia is present.  Musculoskeletal:     Cervical back: Normal range of motion and neck supple.  Lymphadenopathy:     Cervical: No cervical adenopathy.  Skin:    General: Skin is warm and dry.     Findings: No rash.  Neurological:     Mental Status: He is alert.     Cranial  Nerves: No cranial nerve deficit.     Sensory: No sensory deficit.     Gait: Gait normal.     Deep Tendon Reflexes: Reflexes are normal and symmetric.  Psychiatric:        Speech: Speech normal.        Behavior: Behavior normal.        Thought Content: Thought content normal.        Judgment: Judgment normal.    Diabetic foot exam: Normal inspection No skin breakdown No calluses  Normal DP pulses Normal sensation to light touch and monofilament Nails normal     Results for orders placed or performed in visit on 09/28/23  POCT glycosylated hemoglobin (Hb A1C)   Collection Time: 09/28/23  4:12 PM  Result Value Ref Range   Hemoglobin A1C 5.9 (A) 4.0 - 5.6 %   HbA1c POC (<> result, manual entry)     HbA1c, POC (prediabetic range)     HbA1c, POC (controlled diabetic range)       COVID 19 screen:  No recent travel or known exposure to COVID19 The patient denies respiratory symptoms of COVID 19 at this time. The importance of social distancing was discussed today.   Assessment and Plan   Problem List Items Addressed This Visit     Class 2 severe obesity due to excess calories with serious comorbidity and body mass index (BMI) of 35.0 to 35.9 in adult [facility])   Chronic, now back on semaglutide . Encouraged exercise, weight loss, healthy eating habits.       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  Had stopped semaglutide  given N/V... has now restarted with no SE.  If symptoms recur will decrease dose.   Semaglutide  2 mg weekly.      Relevant Orders  POCT glycosylated hemoglobin (Hb A1C) (Completed)   Microalbumin / creatinine urine ratio   Pneumococcal conjugate vaccine 20-valent (Prevnar 20) (Completed)   Other Visit Diagnoses       Need for vaccination against Streptococcus pneumoniae       Relevant Orders   Pneumococcal conjugate vaccine 20-valent (Prevnar 20) (Completed)       Return in  about 6 months (around 03/30/2024).     Greig Ring, MD

## 2023-09-29 ENCOUNTER — Ambulatory Visit: Payer: Self-pay | Admitting: Family Medicine

## 2023-09-29 LAB — MICROALBUMIN / CREATININE URINE RATIO
Creatinine,U: 87.5 mg/dL
Microalb Creat Ratio: 10.6 mg/g (ref 0.0–30.0)
Microalb, Ur: 0.9 mg/dL (ref 0.0–1.9)

## 2023-10-08 ENCOUNTER — Other Ambulatory Visit: Payer: Self-pay | Admitting: Family Medicine

## 2023-11-05 ENCOUNTER — Other Ambulatory Visit: Payer: Self-pay | Admitting: Family Medicine

## 2023-11-22 ENCOUNTER — Ambulatory Visit (INDEPENDENT_AMBULATORY_CARE_PROVIDER_SITE_OTHER)
Admission: RE | Admit: 2023-11-22 | Discharge: 2023-11-22 | Disposition: A | Discharge status: Home / Self Care | Source: Ambulatory Visit | Attending: General Practice | Admitting: General Practice

## 2023-11-22 ENCOUNTER — Ambulatory Visit (INDEPENDENT_AMBULATORY_CARE_PROVIDER_SITE_OTHER): Admitting: General Practice

## 2023-11-22 ENCOUNTER — Encounter: Payer: Self-pay | Admitting: General Practice

## 2023-11-22 VITALS — BP 118/70 | HR 77 | Temp 98.3°F | Ht 70.25 in | Wt 287.0 lb

## 2023-11-22 DIAGNOSIS — R051 Acute cough: Secondary | ICD-10-CM

## 2023-11-22 DIAGNOSIS — R0602 Shortness of breath: Secondary | ICD-10-CM | POA: Diagnosis not present

## 2023-11-22 MED ORDER — PREDNISONE 10 MG (21) PO TBPK: ORAL_TABLET | ORAL | 0 refills | Status: DC

## 2023-11-22 NOTE — Patient Instructions (Signed)
 Complete xray(s) prior to leaving today. I will notify you of your results once received.  Start prednisone  taper. Please follow instructions on the package.  Continue inhaler, mucinex , cough syrup.   I will be in touch regarding the antibiotic.   As discussed please go to the ER if your symptoms worsen.  It was a pleasure meeting you!

## 2023-11-22 NOTE — Progress Notes (Signed)
 Established Patient Office Visit  Subjective   Patient ID: Richard Giles, male    DOB: [birth year]  Age: 63 y.o. MRN: [id number]  Chief Complaint  Patient presents with   Cough    - cough is productive, congestion in nose and chest. Patient has had sx since Tuesday. Has been taking off brand mucinex , symbicort , rescue inhaler, perles for cough and had left over hydrocodone  cough syrup he's taken.     HPI  Richard Giles is a 63 year old male, patient of Dr. Avelina, presents today for an acute visit.   Discussed the use of AI scribe software for clinical note transcription with the patient, who gave verbal consent to proceed.  History of Present Illness Richard Giles is a 63 year old male with chronic bronchitis who presents with a productive cough and congestion.  His symptoms began last weekend with congestion. The cough is productive, with sputum that is sometimes yellow and sometimes white. He also experiences a sore throat and sinus drainage, but no ear pain.  No fever, chills, nausea, vomiting, diarrhea, constipation, dizziness, headache, or urinary symptoms. He feels slightly short of breath and occasionally wheezes. 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  He has a history of chronic bronchitis but denies any history of asthma or smoking. He has tried Mucinex , Symbicort , a rescue inhaler, Tessalon  Perles, and hydrocodone  cough syrup, though he only uses the latter at night due to its sedative effects. He notes a slight improvement in symptoms today compared to the weekend.   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 Sherwood Finn : Maybe trees Did not receive shots per patient   Chronic bronchitis (HCC)    Chronic cough onset 2005/ resolved on ICS July 19,2011   Sinus CT negative 07/08/2009 negative   Chronic kidney disease    per pt 01-04-2015--autoimmune disease per pt   COPD (chronic obstructive pulmonary disease) (HCC)    per pt 01-04-15   Diabetes mellitus without complication (HCC)    Dysplastic nevus of trunk 02/04/2018   GERD (gastroesophageal reflux disease)    Hyperlipidemia    Hypertension    Restless leg syndrome    Sleep apnea    does not have to use cpap   Past Surgical History:  Procedure Laterality Date  bialteral inguinal hernia  1967   COLONOSCOPY  01/14/2015   FOOT SURGERY  09/03/2003   Left foot osteophye removal (Dr. Addie)   POLYPECTOMY     SEPTOPLASTY  1983   at 63 years of age   Allergies  Allergen Reactions   Bee Venom Anaphylaxis   Hornet Venom Anaphylaxis   Lisinopril Cough    Other reaction(s): Cough Other reaction(s): Cough   Codeine Diarrhea, Nausea And Vomiting and Rash         11/22/2023    3:51 PM 03/05/2023    1:04 PM 09/28/2022   12:31 PM   Depression screen PHQ 2/9  Decreased Interest 0 0 0  Down, Depressed, Hopeless 0 0 0  PHQ - 2 Score 0 0 0  Altered sleeping 0  0  Tired, decreased energy 0  0  Change in appetite 0  0  Feeling bad or failure about yourself  0  0  Trouble concentrating 0  0  Moving slowly or fidgety/restless 0  0  Suicidal thoughts 0  0  PHQ-9 Score 0  0  Difficult doing work/chores Not difficult at all  Not difficult at all       11/22/2023    3:51 PM 09/28/2022   12:31 PM 08/18/2022   12:49 PM 06/13/2021    2:57 PM  GAD 7 : Generalized Anxiety Score  Nervous, Anxious, on Edge 0 0 0 0  Control/stop worrying 0 0 0 0  Worry too much - different things 0 0 0 0  Trouble relaxing 0 0 0 0  Restless 0 0 0 0  Easily annoyed or irritable 0 0 0 0  Afraid - awful might happen 0 0 0 0  Total GAD 7 Score 0 0 0 0  Anxiety Difficulty Not difficult at all Not difficult at all Not difficult at all Not difficult at all      Review of Systems  Constitutional:  Negative for chills, fever and malaise/fatigue.  HENT:  Positive for congestion, ear pain and sore throat.   Respiratory:  Positive for cough, sputum production, shortness of breath and wheezing.   Cardiovascular:  Negative for chest pain.  Gastrointestinal:  Negative for abdominal pain, constipation, diarrhea, heartburn, nausea and vomiting.  Genitourinary:  Negative for dysuria, frequency and urgency.  Neurological:  Negative for dizziness and headaches.  Endo/Heme/Allergies:  Negative for polydipsia.  Psychiatric/Behavioral:  Negative for depression and suicidal ideas. The patient is not nervous/anxious.       Objective:     BP 118/70   Pulse 77   Temp 98.3 F (36.8 C) (Oral)   Ht 5' 10.25 (1.784 m)   Wt 287 lb (130.2 kg)   SpO2 98%   BMI 40.89 kg/m  BP Readings from Last 3 Encounters:  11/22/23 118/70  09/28/23 110/64  07/02/23 123/76   Wt Readings from Last 3 Encounters:  11/22/23 287 lb (130.2 kg)  09/28/23 274 lb 2 oz (124.3  kg)  08/24/23 268 lb (121.6 kg)      Physical Exam Vitals and nursing note reviewed.  Constitutional:      Appearance: Normal appearance.  HENT:     Right Ear: Tympanic membrane, ear canal and external ear normal.     Left Ear: Tympanic membrane, ear canal and external ear normal.     Nose: Congestion present.     Mouth/Throat:     Pharynx: Oropharynx is clear.  Eyes:     Conjunctiva/sclera: Conjunctivae normal.  Cardiovascular:  Rate and Rhythm: Normal rate and regular rhythm.     Pulses: Normal pulses.     Heart sounds: Normal heart sounds.  Pulmonary:     Effort: Pulmonary effort is normal.     Breath sounds: Rhonchi present.  Neurological:     Mental Status: He is alert and oriented to person, place, and time.  Psychiatric:        Mood and Affect: Mood normal.        Behavior: Behavior normal.        Thought Content: Thought content normal.        Judgment: Judgment normal.      No results found for any visits on 11/22/23.     The ASCVD Risk score (Arnett DK, et al., 2019) failed to calculate for the following reasons:   The valid total cholesterol range is 130 to 320 mg/dL    Assessment & Plan:  Shortness of breath -     predniSONE ; Please follow instructions on the package.  Dispense: 1 each; Refill: 0  Acute cough -     predniSONE ; Please follow instructions on the package.  Dispense: 1 each; Refill: 0 -     DG Chest 2 View    Assessment and Plan Assessment & Plan Acute bronchitis (suspected) Productive cough with yellow and white sputum, intermittent wheezing, and mild dyspnea. Differential includes pneumonia. - Order chest x-ray to evaluate for pneumonia. - Prescribe prednisone  taper. - Continue Tessalon  Perles, hydrocodone  cough syrup, and inhalers as needed. - Determine need for antibiotics based on chest x-ray results.  Return if symptoms worsen or fail to improve.    Carrol Aurora, NP

## 2023-11-23 ENCOUNTER — Ambulatory Visit: Payer: Self-pay | Admitting: General Practice

## 2023-11-23 DIAGNOSIS — J40 Bronchitis, not specified as acute or chronic: Secondary | ICD-10-CM

## 2023-11-23 MED ORDER — DOXYCYCLINE HYCLATE 100 MG PO TABS: 100.0000 mg | ORAL_TABLET | Freq: Two times a day (BID) | ORAL | 0 refills | Status: AC

## 2023-11-24 ENCOUNTER — Other Ambulatory Visit: Payer: Self-pay | Admitting: Internal Medicine

## 2023-11-26 ENCOUNTER — Encounter: Payer: Self-pay | Admitting: General Practice

## 2023-11-26 DIAGNOSIS — R051 Acute cough: Secondary | ICD-10-CM

## 2023-11-26 MED ORDER — BENZONATATE 200 MG PO CAPS: 200.0000 mg | ORAL_CAPSULE | Freq: Three times a day (TID) | ORAL | 0 refills | Status: AC | PRN

## 2023-12-07 ENCOUNTER — Ambulatory Visit: Payer: Self-pay

## 2023-12-07 NOTE — Telephone Encounter (Signed)
 Appointment with Dr. Avelina 12/08/2023 at 11:40 am.

## 2023-12-07 NOTE — Telephone Encounter (Signed)
 FYI Only or Action Required?: FYI only for provider.  Patient was last seen in primary care on 11/22/2023 by Richard Shivers, NP.  Called Nurse Triage reporting Hand Problem.  Symptoms began several weeks ago.  Interventions attempted: Rest, hydration, or home remedies and Ice/heat application.  Symptoms are: unchanged.  Triage Disposition: See Physician Within 24 Hours  Patient/caregiver understands and will follow disposition?: Yes             Copied from CRM #[id number]. Topic: Clinical - Red Word Triage >> Dec 07, 2023  8:16 AM Suzen RAMAN wrote: Red Word that prompted transfer to Nurse Triage: swollen right hand, experiencing pain and possible broken hand may need x-ray. Requesting an appt today Reason for Disposition  Can't use injured hand normally (e.g., make a fist, open fully, hold a glass of water)    Triage limited d/t wife calling on pt behalf. Triager reviewed red flags and provided [facility] as alternative resource for worsening/red flag symptoms.  Answer Assessment - Initial Assessment Questions 1. MECHANISM: How did the injury happen?     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 ONSET: When did the injury happen? (Minutes or hours ago)      Unknown, at least 1.5 weeks 3. APPEARANCE of INJURY: What does the injury look like?      swelling 4. SEVERITY: Can you use the hand normally? Can you bend your fingers into a ball and then fully open them?     Wife endorses able to use hand, but hurting 5. SIZE: For cuts, bruises, or swelling, ask: How large is it? (e.g., inches or centimeters;  entire hand or wrist)      swelling 6. PAIN: Is there pain? If Yes, ask: How bad is the pain?  (Scale 1-10; or mild, moderate, severe)     yes 7. TETANUS: For any breaks in the skin, ask: When was the last tetanus booster?     *No Answer* 8. OTHER SYMPTOMS: Do you have any other symptoms?       unknown 9. PREGNANCY: Is there any chance you are pregnant? When was your last menstrual period?     N/a  Protocols used: Hand and Wrist Injury-A-AH

## 2023-12-08 ENCOUNTER — Ambulatory Visit (INDEPENDENT_AMBULATORY_CARE_PROVIDER_SITE_OTHER)
Admission: RE | Admit: 2023-12-08 | Discharge: 2023-12-08 | Disposition: A | Discharge status: Home / Self Care | Source: Ambulatory Visit | Attending: Family Medicine | Admitting: Family Medicine

## 2023-12-08 ENCOUNTER — Encounter: Payer: Self-pay | Admitting: Family Medicine

## 2023-12-08 ENCOUNTER — Ambulatory Visit: Admitting: Family Medicine

## 2023-12-08 ENCOUNTER — Ambulatory Visit: Payer: Self-pay | Admitting: Family Medicine

## 2023-12-08 VITALS — BP 118/60 | HR 71 | Temp 98.0°F | Ht 70.25 in | Wt 286.2 lb

## 2023-12-08 DIAGNOSIS — M25531 Pain in right wrist: Secondary | ICD-10-CM | POA: Insufficient documentation

## 2023-12-08 MED ORDER — OZEMPIC (2 MG/DOSE) 8 MG/3ML ~~LOC~~ SOPN: 2.0000 mg | PEN_INJECTOR | SUBCUTANEOUS | 1 refills | Status: AC

## 2023-12-08 NOTE — Progress Notes (Signed)
 Patient ID: Richard Giles, male    DOB: 20-Nov-1960, 63 y.o.   MRN: [id number]  This visit was conducted in person.  BP 118/60   Pulse 71   Temp 98 F (36.7 C) (Oral)   Ht 5' 10.25 (1.784 m)   Wt 286 lb 3.2 oz (129.8 kg)   SpO2 96%   BMI 40.77 kg/m    CC:  Chief Complaint  Patient presents with   Hand Swelling    Right. Patient got hit by a baseball. Swelling is down but its painful    Subjective:   HPI: Richard Giles is a 63 y.o. male presenting on 12/08/2023 for Hand Swelling (Right. Patient got hit by a baseball. Swelling is down but its painful)   New injury.. hit in right hand with  baseball  2 week ago.  Had bruising and swelling  Now with increased pain, less swelling... soreness on  right medial wrist. Pain with flexion and extension.  Side to side motion normal.  Grip strength is fine.     Using tylenol  off and on.   He is a Secondary school teacher.  He needs refill of his semaglutide  2 mg weekly Lab Results  Component Value Date   HGBA1C 5.9 (A) 09/28/2023   Wt Readings from Last 3 Encounters:  12/08/23 286 lb 3.2 oz (129.8 kg)  11/22/23 287 lb (130.2 kg)  09/28/23 274 lb 2 oz (124.3 kg)     Relevant past medical, surgical, family and social history reviewed and updated as indicated. Interim medical history since our last visit reviewed. Allergies and medications reviewed and updated. Outpatient Medications Prior to Visit  Medication Sig Dispense Refill   ACCU-CHEK GUIDE test strip TEST BLOOD SUGAR EVERY DAY 100 strip 3   Accu-Chek Softclix Lancets lancets TEST BLOOD SUGAR ONCE DAILY     acetaminophen  (TYLENOL ) 500 MG tablet Take 1,000 mg by mouth every 6 (six) hours as needed (pain).     albuterol  (PROVENTIL ) (2.5 MG/3ML) 0.083% nebulizer solution Take 3 mLs (2.5 mg total) by nebulization every 6 (six) hours as needed for wheezing or shortness of breath. 150 mL 1   albuterol  (VENTOLIN  HFA) 108 (90 Base) MCG/ACT inhaler Inhale 2 puffs into the lungs every 4  (four) hours as needed for wheezing or shortness of breath. 1 each 3   allopurinol  (ZYLOPRIM ) 300 MG tablet TAKE 1 TABLET BY MOUTH EVERY DAY 90 tablet 3   aspirin 81 MG tablet Take 81 mg by mouth daily.     atorvastatin  (LIPITOR) 40 MG tablet TAKE 1 TABLET BY MOUTH EVERY DAY 90 tablet 3   benzonatate  (TESSALON ) 200 MG capsule Take 1 capsule (200 mg total) by mouth 3 (three) times daily as needed. 20 capsule 0   Blood Glucose Monitoring Suppl (ACCU-CHEK GUIDE ME) w/Device KIT daily.     budesonide -formoterol  (SYMBICORT ) 80-4.5 MCG/ACT inhaler TAKE 2 PUFFS FIRST THING IN AM AND THEN ANOTHER 2 PUFFS ABOUT 12 HOURS LATER. 30.6 each 0   cetirizine  (ZYRTEC ) 10 MG tablet Take 1 tablet (10 mg total) by mouth daily. 14 tablet 0   Cholecalciferol (VITAMIN D3 PO) Take 1 tablet by mouth daily.     cyclobenzaprine  (FLEXERIL ) 10 MG tablet Take 1 tablet (10 mg total) by mouth 3 (three) times daily as needed for muscle spasms. 20 tablet 0   empagliflozin  (JARDIANCE ) 25 MG TABS tablet TAKE 1 TABLET BY MOUTH EVERY DAY BEFORE BREAKFAST 90 tablet 1   EPINEPHrine  0.3 mg/0.3 mL IJ  SOAJ injection INJECT 0.3 MG INTO THE MUSCLE AS NEEDED FOR ANAPHYLAXIS. 2 each 0   famotidine  (PEPCID ) 20 MG tablet TAKE 1 TABLET BY MOUTH EVERY DAY AFTER SUPPER 90 tablet 3   fexofenadine (ALLEGRA) 180 MG tablet Take 180 mg by mouth daily.      FLUoxetine  (PROZAC ) 20 MG capsule TAKE 1 CAPSULE BY MOUTH EVERY DAY 90 capsule 1   fluticasone  (FLONASE ) 50 MCG/ACT nasal spray SPRAY 2 SPRAYS INTO EACH NOSTRIL EVERY DAY 48 mL 0   furosemide  (LASIX ) 20 MG tablet TAKE 1 TABLET BY MOUTH EVERY DAY AS NEEDED 90 tablet 1   irbesartan  (AVAPRO ) 150 MG tablet TAKE 1 TABLET BY MOUTH EVERY DAY 90 tablet 3   meclizine  (ANTIVERT ) 25 MG tablet Take 1 tablet (25 mg total) by mouth 3 (three) times daily as needed for dizziness. Caution of sedation 30 tablet 0   montelukast  (SINGULAIR ) 10 MG tablet TAKE 1 TABLET BY MOUTH EVERYDAY AT BEDTIME 90 tablet 3    pantoprazole  (PROTONIX ) 40 MG tablet Take 1 tablet (40 mg total) by mouth daily. Take 30-60 min before first meal of the day 30 tablet 2   Probiotic Product (PROBIOTIC PO) Take 1 tablet by mouth daily.     Semaglutide , 2 MG/DOSE, (OZEMPIC , 2 MG/DOSE,) 8 MG/3ML SOPN INJECT 2 MG INTO THE SKIN ONCE A WEEK. 3 mL 0   ondansetron  (ZOFRAN -ODT) 4 MG disintegrating tablet Take 1 tablet (4 mg total) by mouth every 8 (eight) hours as needed for nausea or vomiting. 20 tablet 0   predniSONE  (STERAPRED UNI-PAK 21 TAB) 10 MG (21) TBPK tablet Please follow instructions on the package. 1 each 0   No facility-administered medications prior to visit.     Per HPI unless specifically indicated in ROS section below Review of Systems  Constitutional:  Negative for fatigue and fever.  HENT:  Negative for ear pain.   Eyes:  Negative for pain.  Respiratory:  Negative for cough and shortness of breath.   Cardiovascular:  Negative for chest pain, palpitations and leg swelling.  Gastrointestinal:  Negative for abdominal pain.  Genitourinary:  Negative for dysuria.  Musculoskeletal:  Negative for arthralgias.  Neurological:  Negative for syncope, light-headedness and headaches.  Psychiatric/Behavioral:  Negative for dysphoric mood.    Objective:  BP 118/60   Pulse 71   Temp 98 F (36.7 C) (Oral)   Ht 5' 10.25 (1.784 m)   Wt 286 lb 3.2 oz (129.8 kg)   SpO2 96%   BMI 40.77 kg/m   Wt Readings from Last 3 Encounters:  12/08/23 286 lb 3.2 oz (129.8 kg)  11/22/23 287 lb (130.2 kg)  09/28/23 274 lb 2 oz (124.3 kg)      Physical Exam Vitals reviewed.  Constitutional:      Appearance: He is well-developed.  HENT:     Head: Normocephalic.     Right Ear: Hearing normal.     Left Ear: Hearing normal.     Nose: Nose normal.  Neck:     Thyroid: No thyroid mass or thyromegaly.     Vascular: No carotid bruit.     Trachea: Trachea normal.  Cardiovascular:     Rate and Rhythm: Normal rate and regular rhythm.      Pulses: Normal pulses.     Heart sounds: Heart sounds not distant. No murmur heard.    No friction rub. No gallop.     Comments: No peripheral edema Pulmonary:     Effort: Pulmonary effort is  normal. No respiratory distress.     Breath sounds: Normal breath sounds.  Musculoskeletal:     Left forearm: Tenderness and bony tenderness present.     Right wrist: Normal.     Left wrist: Swelling, deformity, tenderness and bony tenderness present. No snuff box tenderness. Decreased range of motion.     Right hand: Normal. No tenderness or bony tenderness. Normal range of motion.     Left hand: Tenderness and bony tenderness present.  Skin:    General: Skin is warm and dry.     Findings: No rash.  Psychiatric:        Speech: Speech normal.        Behavior: Behavior normal.        Thought Content: Thought content normal.       Results for orders placed or performed in visit on 09/28/23  POCT glycosylated hemoglobin (Hb A1C)   Collection Time: 09/28/23  4:12 PM  Result Value Ref Range   Hemoglobin A1C 5.9 (A) 4.0 - 5.6 %   HbA1c POC (<> result, manual entry)     HbA1c, POC (prediabetic range)     HbA1c, POC (controlled diabetic range)    Microalbumin / creatinine urine ratio   Collection Time: 09/28/23  4:18 PM  Result Value Ref Range   Microalb, Ur 0.9 0.0 - 1.9 mg/dL   Creatinine,U 12.4 mg/dL   Microalb Creat Ratio 10.6 0.0 - 30.0 mg/g    Assessment and Plan  Acute pain of right wrist Assessment & Plan: Acute, bony bruise versus small fracture at right dorsal wrist/proximal metacarpal. Will evaluate with x-ray. Recommend movement limiting thumb spica splint, ice and elevation.  Can use topical diclofenac up to 4 times a day as needed for inflammation, Tylenol  for pain.  NSAIDs contraindicated orally given decreased kidney function.  Consider referral to orthopedics for additional recommendations if not improving as expected.  Orders: -     DG Wrist Complete Right;  Future  Other orders -     Ozempic  (2 MG/DOSE); Inject 2 mg into the skin once a week.  Dispense: 9 mL; Refill: 1    No follow-ups on file.   Greig Ring, MD

## 2023-12-08 NOTE — Assessment & Plan Note (Signed)
 Acute, bony bruise versus small fracture at right dorsal wrist/proximal metacarpal. Will evaluate with x-ray. Recommend movement limiting thumb spica splint, ice and elevation.  Can use topical diclofenac up to 4 times a day as needed for inflammation, Tylenol  for pain.  NSAIDs contraindicated orally given decreased kidney function.  Consider referral to orthopedics for additional recommendations if not improving as expected.

## 2023-12-14 ENCOUNTER — Other Ambulatory Visit: Payer: Self-pay | Admitting: Internal Medicine

## 2024-01-11 ENCOUNTER — Telehealth: Payer: Self-pay | Admitting: *Deleted

## 2024-01-11 NOTE — Telephone Encounter (Signed)
 Spoke with Mrs. Luann.  She is wanting to see if anyone else has a late day opening that Ikaika can see.  He is currently scheduled to see Dr. 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  He is not able to come in today.  I looked at everyone's schedule for tomorrow but there are no availability.  I recommended that she go on [employer] and try to schedule a virtual visit or E-Visit for this afternoon.  Lauraine will try and do that but she will also keep his appointment scheduled for tomorrow with Dr. Avelina is case she is not able to get a virtual visit.

## 2024-01-11 NOTE — Telephone Encounter (Signed)
 Copied from CRM [id number]. Topic: Appointments - Appointment Scheduling >> Nov 22, 2023  8:00 AM Laymon HERO wrote: Patient/patient representative is calling to schedule an appointment. Refer to attachments for appointment information. 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  Pls call wife [phone]

## 2024-01-11 NOTE — Telephone Encounter (Signed)
 Noted

## 2024-01-12 ENCOUNTER — Ambulatory Visit: Admitting: Family Medicine

## 2024-01-12 VITALS — BP 126/80 | HR 95 | Temp 97.7°F | Ht 70.5 in | Wt 296.0 lb

## 2024-01-12 DIAGNOSIS — Z8614 Personal history of Methicillin resistant Staphylococcus aureus infection: Secondary | ICD-10-CM | POA: Diagnosis not present

## 2024-01-12 DIAGNOSIS — T783XXA Angioneurotic edema, initial encounter: Secondary | ICD-10-CM | POA: Insufficient documentation

## 2024-01-12 DIAGNOSIS — L0201 Cutaneous abscess of face: Secondary | ICD-10-CM | POA: Diagnosis not present

## 2024-01-12 DIAGNOSIS — E1159 Type 2 diabetes mellitus with other circulatory complications: Secondary | ICD-10-CM

## 2024-01-12 MED ORDER — HYDROCODONE-ACETAMINOPHEN 5-325 MG PO TABS: 1.0000 | ORAL_TABLET | Freq: Four times a day (QID) | ORAL | 0 refills | Status: AC | PRN

## 2024-01-12 MED ORDER — DOXYCYCLINE HYCLATE 100 MG PO TABS
100.0000 mg | ORAL_TABLET | Freq: Two times a day (BID) | ORAL | 0 refills | Status: AC
Start: 2024-01-12 — End: ?

## 2024-01-12 MED ORDER — CEFTRIAXONE SODIUM 1 G IJ SOLR
1.0000 g | Freq: Once | INTRAMUSCULAR | Status: AC
  Administered 2024-01-12: 1 g via INTRAMUSCULAR

## 2024-01-12 MED ORDER — METHYLPREDNISOLONE ACETATE 80 MG/ML IJ SUSP
80.0000 mg | Freq: Once | INTRAMUSCULAR | Status: AC
  Administered 2024-01-12: 80 mg via INTRAMUSCULAR

## 2024-01-12 MED ORDER — METHYLPREDNISOLONE ACETATE 40 MG/ML IJ SUSP: 40.0000 mg | Freq: Once | INTRAMUSCULAR | Status: DC

## 2024-01-12 NOTE — Patient Instructions (Signed)
 Start doxycycline  today in addition to the antibiotic administered at the office. Start warm compresses right lip 3 times daily. Can try hydrocodone  as needed for pain. Close follow-up in 24 hours to assure improvement. If severe pain and redness spreading within the next 24 hours go to the emergency room.

## 2024-01-12 NOTE — Addendum Note (Signed)
 Addended by: BEVELY CURTISTINE PARAS on: 01/12/2024 01:06 PM   Modules accepted: Orders

## 2024-01-12 NOTE — Assessment & Plan Note (Signed)
 Acute, symptoms most likely started at hair follicle on right upper beard with now associated lip swelling, lower face swelling and lymphadenopathy. Symptoms not clearly associated with allergic response, doubt angioedema.  Will treat with Depo-Medrol  80 mg x 1 as well as ceftriaxone  1 g x 1.  Will also have patient's start doxycycline  100 mg p.o. twice daily x 10 days. Encouraged warm compresses. No fluctuance of lesion or clear indication for incision and drainage.  Pain control with Tylenol  or may try hydrocodone  as long as no associated side effects.  Reviewed ER precautions with patient in detail.  If redness spreading, fever on antibiotics, pain not controlled or any respiratory compromise patient is to go to the emergency room. Close follow-up in 24 hours for reevaluation.

## 2024-01-12 NOTE — Assessment & Plan Note (Signed)
 Chronic, previously well-controlled.  Encouraged patient to follow blood sugar at home given ongoing infection and steroid injection. Continue low-carb diet.  Ongoing diabetes is complication for infection.

## 2024-01-12 NOTE — Progress Notes (Signed)
 Patient ID: Richard Giles, male    DOB: 09/29/1960, 63 y.o.   MRN: [id number]  This visit was conducted in person.  BP 126/80   Pulse 95   Temp 97.7 F (36.5 C) (Oral)   Ht 5' 10.5 (1.791 m)   Wt 296 lb (134.3 kg)   SpO2 99%   BMI 41.87 kg/m    CC:  Chief Complaint  Patient presents with   Facial Swelling    Lip swelling. Started Sunday or first thing Monday morning. This isnt the first time this has happened. But it is first time on lip that it has happened. Painful. States he has had chills but temperature didn't show a fever.     Subjective:   HPI: Richard Giles is a 63 y.o. male presenting on 01/12/2024 for Facial Swelling (Lip swelling. Started Sunday or first thing Monday morning. This isnt the first time this has happened. But it is first time on lip that it has happened. Painful. States he has had chills but temperature didn't show a fever. 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   Today he reports gradually worsening swelling in right lower lip.[name] started 3 days ago, rapidly progressing.  Soreness under lip, pustule present.  Pain in lower lip and neck.. pain keeping him up at night. Chills last night, no fever.  No swallowing issue  No tongue swelling   No SOB.   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   No SE to doxy in past.   He is on irbesartan  No new meds or new exposures  Lab Results  Component Value Date   HGBA1C 5.9 (A) 09/28/2023     Relevant past medical, surgical, family and social history reviewed and updated as indicated. Interim medical history since our last visit reviewed. Allergies and medications reviewed and updated. Outpatient Medications Prior to Visit  Medication Sig Dispense Refill   ACCU-CHEK GUIDE test strip TEST BLOOD SUGAR EVERY DAY 100 strip 3   Accu-Chek Softclix Lancets lancets TEST BLOOD SUGAR ONCE DAILY      acetaminophen  (TYLENOL ) 500 MG tablet Take 1,000 mg by mouth every 6 (six) hours as needed (pain).     albuterol  (PROVENTIL ) (2.5 MG/3ML) 0.083% nebulizer solution Take 3 mLs (2.5 mg total) by nebulization every 6 (six) hours as needed for wheezing or shortness of breath. 150 mL 1   albuterol  (VENTOLIN  HFA) 108 (90 Base) MCG/ACT inhaler Inhale 2 puffs into the lungs every 4 (four) hours as needed for wheezing or shortness of breath. 1 each 3   allopurinol  (ZYLOPRIM ) 300 MG tablet TAKE 1 TABLET BY MOUTH EVERY DAY 90 tablet 3   aspirin 81 MG tablet Take 81 mg by mouth daily.     atorvastatin  (LIPITOR) 40 MG tablet TAKE 1 TABLET BY MOUTH EVERY DAY 90 tablet 3   benzonatate  (TESSALON ) 200 MG capsule Take 1 capsule (200 mg total) by mouth 3 (three) times daily as needed. 20 capsule 0   Blood Glucose Monitoring Suppl (ACCU-CHEK GUIDE ME) w/Device KIT daily.     budesonide -formoterol  (SYMBICORT ) 80-4.5 MCG/ACT inhaler TAKE 2 PUFFS FIRST THING IN AM AND THEN ANOTHER 2 PUFFS ABOUT 12 HOURS LATER. 30.6 each 0   cetirizine  (ZYRTEC ) 10 MG tablet Take 1 tablet (10 mg total) by mouth daily. 14 tablet 0   Cholecalciferol (VITAMIN D3 PO) Take 1 tablet by  mouth daily.     cyclobenzaprine  (FLEXERIL ) 10 MG tablet Take 1 tablet (10 mg total) by mouth 3 (three) times daily as needed for muscle spasms. 20 tablet 0   empagliflozin  (JARDIANCE ) 25 MG TABS tablet TAKE 1 TABLET BY MOUTH EVERY DAY BEFORE BREAKFAST 90 tablet 1   EPINEPHrine  0.3 mg/0.3 mL IJ SOAJ injection INJECT 0.3 MG INTO THE MUSCLE AS NEEDED FOR ANAPHYLAXIS. 2 each 0   famotidine  (PEPCID ) 20 MG tablet TAKE 1 TABLET BY MOUTH EVERY DAY AFTER SUPPER 90 tablet 3   fexofenadine (ALLEGRA) 180 MG tablet Take 180 mg by mouth daily.      FLUoxetine  (PROZAC ) 20 MG capsule TAKE 1 CAPSULE BY MOUTH EVERY DAY 90 capsule 1   fluticasone  (FLONASE ) 50 MCG/ACT nasal spray SPRAY 2 SPRAYS INTO EACH NOSTRIL EVERY DAY 48 mL 0   furosemide  (LASIX ) 20 MG tablet TAKE 1 TABLET  BY MOUTH EVERY DAY AS NEEDED 90 tablet 1   irbesartan  (AVAPRO ) 150 MG tablet TAKE 1 TABLET BY MOUTH EVERY DAY 90 tablet 3   meclizine  (ANTIVERT ) 25 MG tablet Take 1 tablet (25 mg total) by mouth 3 (three) times daily as needed for dizziness. Caution of sedation 30 tablet 0   montelukast  (SINGULAIR ) 10 MG tablet TAKE 1 TABLET BY MOUTH EVERYDAY AT BEDTIME 90 tablet 3   pantoprazole  (PROTONIX ) 40 MG tablet Take 1 tablet (40 mg total) by mouth daily. Take 30-60 min before first meal of the day 30 tablet 2   Probiotic Product (PROBIOTIC PO) Take 1 tablet by mouth daily.     Semaglutide , 2 MG/DOSE, (OZEMPIC , 2 MG/DOSE,) 8 MG/3ML SOPN Inject 2 mg into the skin once a week. 9 mL 1   No facility-administered medications prior to visit.     Per HPI unless specifically indicated in ROS section below Review of Systems  Constitutional:  Negative for fatigue and fever.  HENT:  Negative for ear pain.   Eyes:  Negative for pain.  Respiratory:  Negative for cough and shortness of breath.   Cardiovascular:  Negative for chest pain, palpitations and leg swelling.  Gastrointestinal:  Negative for abdominal pain.  Genitourinary:  Negative for dysuria.  Musculoskeletal:  Negative for arthralgias.  Neurological:  Negative for syncope, light-headedness and headaches.  Psychiatric/Behavioral:  Negative for dysphoric mood.    Objective:  BP 126/80   Pulse 95   Temp 97.7 F (36.5 C) (Oral)   Ht 5' 10.5 (1.791 m)   Wt 296 lb (134.3 kg)   SpO2 99%   BMI 41.87 kg/m   Wt Readings from Last 3 Encounters:  01/12/24 296 lb (134.3 kg)  12/08/23 286 lb 3.2 oz (129.8 kg)  11/22/23 287 lb (130.2 kg)      Physical Exam Constitutional:      Appearance: He is well-developed.  HENT:     Head: Normocephalic.     Jaw: Tenderness, swelling and pain on movement present.      Comments: Lower lip swelling on right  No gum or tooth tenderness  See photo    Right Ear: Hearing normal.     Left Ear: Hearing normal.      Nose: Nose normal.  Neck:     Thyroid: No thyroid mass or thyromegaly.     Vascular: No carotid bruit.     Trachea: Trachea normal.  Cardiovascular:     Rate and Rhythm: Normal rate and regular rhythm.     Pulses: Normal pulses.     Heart sounds:  Heart sounds not distant. No murmur heard.    No friction rub. No gallop.     Comments: No peripheral edema Pulmonary:     Effort: Pulmonary effort is normal. No respiratory distress.     Breath sounds: Normal breath sounds.  Lymphadenopathy:     Head:     Right side of head: Submandibular and tonsillar adenopathy present. No submental, preauricular, posterior auricular or occipital adenopathy.     Cervical: Cervical adenopathy present.     Right cervical: Superficial cervical adenopathy and deep cervical adenopathy present. No posterior cervical adenopathy.    Upper Body:     Right upper body: No supraclavicular, axillary, pectoral or epitrochlear adenopathy.     Lower Body: No right inguinal adenopathy.  Skin:    General: Skin is warm and dry.     Findings: Erythema and rash present.  Psychiatric:        Speech: Speech normal.        Behavior: Behavior normal.        Thought Content: Thought content normal.       Results for orders placed or performed in visit on 09/28/23  POCT glycosylated hemoglobin (Hb A1C)   Collection Time: 09/28/23  4:12 PM  Result Value Ref Range   Hemoglobin A1C 5.9 (A) 4.0 - 5.6 %   HbA1c POC (<> result, manual entry)     HbA1c, POC (prediabetic range)     HbA1c, POC (controlled diabetic range)    Microalbumin / creatinine urine ratio   Collection Time: 09/28/23  4:18 PM  Result Value Ref Range   Microalb, Ur 0.9 0.0 - 1.9 mg/dL   Creatinine,U 12.4 mg/dL   Microalb Creat Ratio 10.6 0.0 - 30.0 mg/g    Assessment and Plan  Facial abscess Assessment & Plan: Acute, symptoms most likely started at hair follicle on right upper beard with now associated lip swelling, lower face swelling and  lymphadenopathy. Symptoms not clearly associated with allergic response, doubt angioedema.  Will treat with Depo-Medrol  80 mg x 1 as well as ceftriaxone  1 g x 1.  Will also have patient's start doxycycline  100 mg p.o. twice daily x 10 days. Encouraged warm compresses. No fluctuance of lesion or clear indication for incision and drainage.  Pain control with Tylenol  or may try hydrocodone  as long as no associated side effects.  Reviewed ER precautions with patient in detail.  If redness spreading, fever on antibiotics, pain not controlled or any respiratory compromise patient is to go to the emergency room. Close follow-up in 24 hours for reevaluation.   History of MRSA infection  Type 2 diabetes mellitus with other circulatory complications (HCC) Assessment & Plan: Chronic, previously well-controlled.  Encouraged patient to follow blood sugar at home given ongoing infection and steroid injection. Continue low-carb diet.  Ongoing diabetes is complication for infection.   Other orders -     HYDROcodone -Acetaminophen ; Take 1 tablet by mouth every 6 (six) hours as needed for up to 5 days for moderate pain (pain score 4-6).  Dispense: 20 tablet; Refill: 0 -     Doxycycline  Hyclate; Take 1 tablet (100 mg total) by mouth 2 (two) times daily.  Dispense: 20 tablet; Refill: 0    Return in about 1 day (around 01/13/2024).   Greig Ring, MD

## 2024-01-13 ENCOUNTER — Emergency Department (HOSPITAL_BASED_OUTPATIENT_CLINIC_OR_DEPARTMENT_OTHER)

## 2024-01-13 ENCOUNTER — Emergency Department (HOSPITAL_BASED_OUTPATIENT_CLINIC_OR_DEPARTMENT_OTHER)
Admission: EM | Admit: 2024-01-13 | Discharge: 2024-01-13 | Disposition: A | Discharge status: Home / Self Care | Source: Ambulatory Visit | Attending: Emergency Medicine | Admitting: Emergency Medicine

## 2024-01-13 ENCOUNTER — Ambulatory Visit: Admitting: Family Medicine

## 2024-01-13 ENCOUNTER — Other Ambulatory Visit: Payer: Self-pay

## 2024-01-13 ENCOUNTER — Encounter: Payer: Self-pay | Admitting: Family Medicine

## 2024-01-13 VITALS — BP 130/80 | HR 91 | Temp 97.5°F | Ht 70.5 in | Wt 294.1 lb

## 2024-01-13 DIAGNOSIS — Z8614 Personal history of Methicillin resistant Staphylococcus aureus infection: Secondary | ICD-10-CM

## 2024-01-13 DIAGNOSIS — D72829 Elevated white blood cell count, unspecified: Secondary | ICD-10-CM | POA: Insufficient documentation

## 2024-01-13 DIAGNOSIS — Z7982 Long term (current) use of aspirin: Secondary | ICD-10-CM | POA: Diagnosis not present

## 2024-01-13 DIAGNOSIS — L03211 Cellulitis of face: Secondary | ICD-10-CM | POA: Diagnosis not present

## 2024-01-13 DIAGNOSIS — E119 Type 2 diabetes mellitus without complications: Secondary | ICD-10-CM | POA: Diagnosis not present

## 2024-01-13 DIAGNOSIS — E1159 Type 2 diabetes mellitus with other circulatory complications: Secondary | ICD-10-CM

## 2024-01-13 DIAGNOSIS — L0201 Cutaneous abscess of face: Secondary | ICD-10-CM | POA: Diagnosis not present

## 2024-01-13 DIAGNOSIS — R220 Localized swelling, mass and lump, head: Secondary | ICD-10-CM | POA: Diagnosis not present

## 2024-01-13 DIAGNOSIS — S022XXA Fracture of nasal bones, initial encounter for closed fracture: Secondary | ICD-10-CM | POA: Diagnosis not present

## 2024-01-13 LAB — CBC WITH DIFFERENTIAL/PLATELET
Abs Immature Granulocytes: 0.1 K/uL — ABNORMAL HIGH (ref 0.00–0.07)
Basophils Absolute: 0.1 K/uL (ref 0.0–0.1)
Basophils Relative: 0 %
Eosinophils Absolute: 0.1 K/uL (ref 0.0–0.5)
Eosinophils Relative: 0 %
HCT: 43.2 % (ref 39.0–52.0)
Hemoglobin: 14.8 g/dL (ref 13.0–17.0)
Immature Granulocytes: 1 %
Lymphocytes Relative: 6 %
Lymphs Abs: 1.1 K/uL (ref 0.7–4.0)
MCH: 31.9 pg (ref 26.0–34.0)
MCHC: 34.3 g/dL (ref 30.0–36.0)
MCV: 93.1 fL (ref 80.0–100.0)
Monocytes Absolute: 1.1 K/uL — ABNORMAL HIGH (ref 0.1–1.0)
Monocytes Relative: 6 %
Neutro Abs: 17.1 K/uL — ABNORMAL HIGH (ref 1.7–7.7)
Neutrophils Relative %: 87 %
Platelets: 214 K/uL (ref 150–400)
RBC: 4.64 MIL/uL (ref 4.22–5.81)
RDW: 13.5 % (ref 11.5–15.5)
WBC: 19.6 K/uL — ABNORMAL HIGH (ref 4.0–10.5)
nRBC: 0 % (ref 0.0–0.2)

## 2024-01-13 LAB — BASIC METABOLIC PANEL WITH GFR
Anion gap: 12 (ref 5–15)
BUN: 30 mg/dL — ABNORMAL HIGH (ref 8–23)
CO2: 26 mmol/L (ref 22–32)
Calcium: 10.2 mg/dL (ref 8.9–10.3)
Chloride: 100 mmol/L (ref 98–111)
Creatinine, Ser: 2.33 mg/dL — ABNORMAL HIGH (ref 0.61–1.24)
GFR, Estimated: 31 mL/min — ABNORMAL LOW (ref >60)
Glucose, Bld: 125 mg/dL — ABNORMAL HIGH (ref 70–99)
Potassium: 5.1 mmol/L (ref 3.5–5.1)
Sodium: 137 mmol/L (ref 135–145)

## 2024-01-13 MED ORDER — IOHEXOL 300 MG/ML  SOLN
75.0000 mL | Freq: Once | INTRAMUSCULAR | Status: AC | PRN
Start: 2024-01-13 — End: 2024-01-13
  Administered 2024-01-13: 60 mL via INTRAVENOUS

## 2024-01-13 MED ORDER — SULFAMETHOXAZOLE-TRIMETHOPRIM 800-160 MG PO TABS
1.0000 | ORAL_TABLET | Freq: Once | ORAL | Status: AC
  Administered 2024-01-13: 1 via ORAL
  Filled 2024-01-13: qty 1

## 2024-01-13 MED ORDER — OXYCODONE-ACETAMINOPHEN 5-325 MG PO TABS
1.0000 | ORAL_TABLET | Freq: Once | ORAL | Status: AC
  Administered 2024-01-13: 1 via ORAL
  Filled 2024-01-13: qty 1

## 2024-01-13 MED ORDER — CEPHALEXIN 250 MG PO CAPS
500.0000 mg | ORAL_CAPSULE | Freq: Once | ORAL | Status: AC
  Administered 2024-01-13: 500 mg via ORAL
  Filled 2024-01-13: qty 2

## 2024-01-13 MED ORDER — CEPHALEXIN 500 MG PO CAPS: 500.0000 mg | ORAL_CAPSULE | Freq: Four times a day (QID) | ORAL | 0 refills | Status: AC

## 2024-01-13 MED ORDER — SULFAMETHOXAZOLE-TRIMETHOPRIM 800-160 MG PO TABS: 1.0000 | ORAL_TABLET | Freq: Two times a day (BID) | ORAL | 0 refills | Status: AC

## 2024-01-13 MED ORDER — VANCOMYCIN HCL IN DEXTROSE 1-5 GM/200ML-% IV SOLN
1000.0000 mg | Freq: Once | INTRAVENOUS | Status: AC
  Administered 2024-01-13: 1000 mg via INTRAVENOUS
  Filled 2024-01-13: qty 200

## 2024-01-13 NOTE — ED Provider Notes (Signed)
 [place] EMERGENCY DEPARTMENT AT [facility] Provider Note   CSN: [id number] Arrival date & time: 01/13/24  1116     Patient presents with: Facial Swelling   Richard Giles is a 63 y.o. male.   63 year old male who is here today for right sided facial swelling that has been ongoing since Monday.  He saw his PCP yesterday who started him on doxycycline .  He does have a history of diabetes.  Denies any difficulty with swallowing.  Has a history of MRSA infections.        Prior to Admission medications   Medication Sig Start Date End Date Taking? Authorizing Provider  cephALEXin (KEFLEX) 500 MG capsule Take 1 capsule (500 mg total) by mouth 4 (four) times daily. 01/13/24  Yes Mannie Pac T, DO  sulfamethoxazole -trimethoprim  (BACTRIM  DS) 800-160 MG tablet Take 1 tablet by mouth 2 (two) times daily for 7 days. 01/13/24 01/20/24 Yes Mannie Pac T, DO  ACCU-CHEK GUIDE test strip TEST BLOOD SUGAR EVERY DAY 08/14/20   Avelina Greig BRAVO, MD  Accu-Chek Softclix Lancets lancets TEST BLOOD SUGAR ONCE DAILY 05/13/20   [provider]  acetaminophen  (TYLENOL ) 500 MG tablet Take 1,000 mg by mouth every 6 (six) hours as needed (pain).    [provider]  albuterol  (PROVENTIL ) (2.5 MG/3ML) 0.083% nebulizer solution Take 3 mLs (2.5 mg total) by nebulization every 6 (six) hours as needed for wheezing or shortness of breath. 05/26/22   Bedsole, Amy E, MD  albuterol  (VENTOLIN  HFA) 108 (90 Base) MCG/ACT inhaler Inhale 2 puffs into the lungs every 4 (four) hours as needed for wheezing or shortness of breath. 11/19/20   Bedsole, Amy E, MD  allopurinol  (ZYLOPRIM ) 300 MG tablet TAKE 1 TABLET BY MOUTH EVERY DAY 10/08/23   Bedsole, Amy E, MD  aspirin 81 MG tablet Take 81 mg by mouth daily.    [provider]  atorvastatin  (LIPITOR) 40 MG tablet TAKE 1 TABLET BY MOUTH EVERY DAY 04/21/23   Bedsole, Amy E, MD  benzonatate  (TESSALON ) 200 MG capsule Take 1 capsule (200 mg total) by  mouth 3 (three) times daily as needed. 11/26/23   Vincente Shivers, NP  Blood Glucose Monitoring Suppl (ACCU-CHEK GUIDE ME) w/Device KIT daily. 05/13/20   [provider]  budesonide -formoterol  (SYMBICORT ) 80-4.5 MCG/ACT inhaler TAKE 2 PUFFS FIRST THING IN AM AND THEN ANOTHER 2 PUFFS ABOUT 12 HOURS LATER. 09/16/23   Darlean Ozell NOVAK, MD  cetirizine  (ZYRTEC ) 10 MG tablet Take 1 tablet (10 mg total) by mouth daily. 01/28/18   Nche, Roselie Rockford, NP  Cholecalciferol (VITAMIN D3 PO) Take 1 tablet by mouth daily.    [provider]  cyclobenzaprine  (FLEXERIL ) 10 MG tablet Take 1 tablet (10 mg total) by mouth 3 (three) times daily as needed for muscle spasms. 04/09/17   Avram Barnie NOVAK, FNP  doxycycline  (VIBRA -TABS) 100 MG tablet Take 1 tablet (100 mg total) by mouth 2 (two) times daily. 01/12/24   Bedsole, Amy E, MD  empagliflozin  (JARDIANCE ) 25 MG TABS tablet TAKE 1 TABLET BY MOUTH EVERY DAY BEFORE BREAKFAST 09/06/23   Bedsole, Amy E, MD  EPINEPHrine  0.3 mg/0.3 mL IJ SOAJ injection INJECT 0.3 MG INTO THE MUSCLE AS NEEDED FOR ANAPHYLAXIS. 08/23/23   Bedsole, Amy E, MD  famotidine  (PEPCID ) 20 MG tablet TAKE 1 TABLET BY MOUTH EVERY DAY AFTER SUPPER 05/20/23   [name], Michael B, MD  fexofenadine (ALLEGRA) 180 MG tablet Take 180 mg by mouth daily.     [provider]  FLUoxetine  (PROZAC ) 20 MG capsule TAKE 1 CAPSULE BY MOUTH EVERY DAY 10/08/23   Bedsole, Amy E, MD  fluticasone  (FLONASE ) 50 MCG/ACT nasal spray SPRAY 2 SPRAYS INTO EACH NOSTRIL EVERY DAY 09/11/22   Bedsole, Amy E, MD  furosemide  (LASIX ) 20 MG tablet TAKE 1 TABLET BY MOUTH EVERY DAY AS NEEDED 11/05/23   Bedsole, Amy E, MD  HYDROcodone -acetaminophen  (NORCO/VICODIN) 5-325 MG tablet Take 1 tablet by mouth every 6 (six) hours as needed for up to 5 days for moderate pain (pain score 4-6). 01/12/24 01/17/24  Bedsole, Amy E, MD  irbesartan  (AVAPRO ) 150 MG tablet TAKE 1 TABLET BY MOUTH EVERY DAY 10/08/23   Bedsole, Amy E, MD  meclizine   (ANTIVERT ) 25 MG tablet Take 1 tablet (25 mg total) by mouth 3 (three) times daily as needed for dizziness. Caution of sedation 08/18/22   Tower, Laine LABOR, MD  montelukast  (SINGULAIR ) 10 MG tablet TAKE 1 TABLET BY MOUTH EVERYDAY AT BEDTIME 05/10/23   Bedsole, Amy E, MD  pantoprazole  (PROTONIX ) 40 MG tablet Take 1 tablet (40 mg total) by mouth daily. Take 30-60 min before first meal of the day 07/02/22   Darlean Ozell NOVAK, MD  Probiotic Product (PROBIOTIC PO) Take 1 tablet by mouth daily.    [provider]  Semaglutide , 2 MG/DOSE, (OZEMPIC , 2 MG/DOSE,) 8 MG/3ML SOPN Inject 2 mg into the skin once a week. 12/08/23   Bedsole, Amy E, MD    Allergies: Bee venom, Hornet venom, Lisinopril, and Codeine    Review of Systems  Updated Vital Signs BP (!) 140/77   Pulse 85   Temp 98 F (36.7 C) (Temporal)   Resp 16   SpO2 95%   Physical Exam Vitals and nursing note reviewed.  HENT:     Head: Normocephalic.     Mouth/Throat:     Comments: Swelling of the right lower corner of the lip, extending down to the chin.  Erythematous, indurated tissue.  No obvious fluctuance.    (all labs ordered are listed, but only abnormal results are displayed) Labs Reviewed  BASIC METABOLIC PANEL WITH GFR - Abnormal; Notable for the following components:      Result Value   Glucose, Bld 125 (*)    BUN 30 (*)    Creatinine, Ser 2.33 (*)    GFR, Estimated 31 (*)    All other components within normal limits  CBC WITH DIFFERENTIAL/PLATELET - Abnormal; Notable for the following components:   WBC 19.6 (*)    Neutro Abs 17.1 (*)    Monocytes Absolute 1.1 (*)    Abs Immature Granulocytes 0.10 (*)    All other components within normal limits    EKG: None  Radiology: CT Maxillofacial W Contrast Result Date: 01/13/2024 EXAM: CT Face with contrast 01/13/2024 01:04:34 PM TECHNIQUE: CT of the face was performed with the administration of 60 mL of iohexol (OMNIPAQUE) 300 MG/ML solution. Multiplanar reformatted  images are provided for review. Automated exposure control, iterative reconstruction, and/or weight based adjustment of the mA/kV was utilized to reduce the radiation dose to as low as reasonably achievable. COMPARISON: Limited sinus CT 08/10/2014 CLINICAL HISTORY: Nasal abscess; right lower lip/chin abscess. FINDINGS: AERODIGESTIVE TRACT: No mass. No edema. SALIVARY GLANDS: Unremarkable parotid and submandibular glands. LYMPH NODES: Multiple subcentimeter short axis right submandibular lymph nodes, likely reactive. SOFT TISSUES: Prominent soft tissue swelling in the right lower face located anterior to the mandible and involving the lower lip. Associated wider region of subcutaneous fat  infiltration. Thickening of the right platysma. No organized fluid collection. No evidence of a periapical abscess involving the mandibular teeth or bone erosion. No evidence of deep space infection. BRAIN, ORBITS AND SINUSES: Mild bilateral ethmoid and maxillary sinus mucosal thickening. No sinus fluid level. Leftward nasal septal deviation. Clear mastoid air cells and middle ear cavities. BONES: Mildly displaced nasal bone fractures, chronic in appearance. No suspicious bone lesion. IMPRESSION: 1. Right-sided lower facial soft tissue swelling/possible cellulitis. No abscess identified. Electronically signed by: Dasie Hamburg MD 01/13/2024 01:33 PM EST RP Workstation: [id number]     Procedures   Medications Ordered in the ED  sulfamethoxazole -trimethoprim  (BACTRIM  DS) 800-160 MG per tablet 1 tablet (has no administration in time range)  cephALEXin  (KEFLEX ) capsule 500 mg (has no administration in time range)  oxyCODONE -acetaminophen  (PERCOCET/ROXICET) 5-325 MG per tablet 1 tablet (1 tablet Oral Given 01/13/24 1148)  vancomycin  (VANCOCIN ) IVPB 1000 mg/200 mL premix (1,000 mg Intravenous New Bag/Given 01/13/24 1152)    And  vancomycin  (VANCOCIN ) IVPB 1000 mg/200 mL premix (1,000 mg Intravenous New Bag/Given 01/13/24 1313)   iohexol  (OMNIPAQUE ) 300 MG/ML solution 75 mL (60 mLs Intravenous Contrast Given 01/13/24 1300)                                    Medical Decision Making 63 year old male here today with swelling over the right lower side of the face.  Differential diagnoses include cellulitis, facial abscess.  Plan-certainly seem to be infectious process.  No evidence of necrotizing soft tissue infection, no obvious drainable abscess.  Will obtain blood work, CT imaging on the patient.  Patient has only had 1 day of appropriate antibiotics, difficult to state that this is a failure of outpatient therapy at this time.  Patient does have significant risk factor for worsening infection given his history of diabetes, prior history of MRSA infections.  Will provide the patient with some antibiotics here in the ED.  Reassessment 2:15 PM-reviewed the patient's PCP office note.  Patient with a leukocytosis to 19, however he is afebrile normotensive and nontachycardic.  His CT imaging shows some swelling, probable cellulitis but no definitive abscess.  Patient felt as though swelling improved a little bit with antibiotics here in the ED.  He received some vancomycin .  Had a lengthy discussion with the patient about pros and cons of hospital admission versus a true trial of outpatient therapy.  Patient preferred to go home with p.o. antibiotics.  We discussed return precautions at bedside which included but were not limited to worsening of pain, development of fever, increased swelling, difficulty swallowing or fatigue.  Amount and/or Complexity of Data Reviewed Labs: ordered. Radiology: ordered.  Risk Prescription drug management.        Final diagnoses:  Facial cellulitis    ED Discharge Orders          Ordered    cephALEXin  (KEFLEX ) 500 MG capsule  4 times daily        01/13/24 1428    sulfamethoxazole -trimethoprim  (BACTRIM  DS) 800-160 MG tablet  2 times daily        01/13/24 1428                Mannie Pac T, DO 01/13/24 1429

## 2024-01-13 NOTE — ED Triage Notes (Signed)
 Patient states facial swlling/abscess since Monday. Went to his PCP and has been on doxycycline  since yesterday. PCP told him to come to the ER for IV antibiotics.

## 2024-01-13 NOTE — ED Notes (Signed)
 Patient transported to CT

## 2024-01-13 NOTE — Discharge Instructions (Addendum)
 Your CT scan showed cellulitis, but did not show an abscess.  I have sent you a prescription for antibiotics.  I would like you to stop taking your doxycycline .  Please begin taking Bactrim , once in the morning and once in the evening for the next 1 week.  You may take your second dose tonight before bed.  I would like you to begin taking Keflex 4 times per day for the next 1 week.  You may take your second dose of Keflex tonight before bed.  Like we discussed, return to the emergency room immediately for worsening pain or swelling in your face, if you develop fever or trouble swallowing.

## 2024-01-13 NOTE — Assessment & Plan Note (Signed)
 Acute, high risk patient with history of diabetes and MRSA with minimal improvement of facial abscess following ceftriaxone  injection x 1 and oral doxycycline . Severe pain at site of facial abscess, no airway compromise.  Given failure of outpatient treatment recommend ED visit for IV antibiotics IV fluids and possible facial CT to assess for abscess.  Patient agreeable with plan.  He will go to [facility] for treatment.

## 2024-01-13 NOTE — Progress Notes (Signed)
 ED Pharmacy Antibiotic Sign Off An antibiotic consult was received from an ED provider for vancomycin per pharmacy dosing for cellulitis. A chart review was completed to assess appropriateness.   The following one time order(s) were placed:  Vancomycin 2g IV x 1  Further antibiotic and/or antibiotic pharmacy consults should be ordered by the admitting provider if indicated.   Thank you for allowing pharmacy to be a part of this patient's care.   Maguire Killmer, Vernell Helling, [facility]  Clinical Pharmacist 01/13/24 11:58 AM

## 2024-01-13 NOTE — Progress Notes (Signed)
 Patient ID: Richard Giles, male    DOB: [birth year], 63 y.o.   MRN: [id number]  This visit was conducted in person.  BP 130/80   Pulse 91   Temp (!) 97.5 F (36.4 C) (Temporal)   Ht 5' 10.5 (1.791 m)   Wt 294 lb 2 oz (133.4 kg)   SpO2 98%   BMI 41.61 kg/m    CC:  Chief Complaint  Patient presents with   Facial Abscess    Follow up    Subjective:   HPI: Richard Giles is a 63 y.o. male presenting on 01/13/2024 for Facial Abscess (Follow up)   1 day follow up for facial abscess right lower face.  S/P ceftriaxone  1 g x 1 and depo medrol  80 mg x 1.  Started on doxycyline 100 mg BID given history of MRSA.  Started on warm compresses, antibacterial soap, sterilization of home toiletries and towels, sheets.   Today he reports  no  improvement in swelling, redness, pain still severe when pain med running out  Pain is better controlled with hydrocodone ... was able to sleep  No fever. Good water intake, decreased appetite, eating soft stuff.  Some decrease in UOP.   No trouble breathing,  no tongue swelling.  Neck still sore.     History of MRSA  Relevant past medical, surgical, family and social history reviewed and updated as indicated. Interim medical history since our last visit reviewed. Allergies and medications reviewed and updated. Outpatient Medications Prior to Visit  Medication Sig Dispense Refill   ACCU-CHEK GUIDE test strip TEST BLOOD SUGAR EVERY DAY 100 strip 3   Accu-Chek Softclix Lancets lancets TEST BLOOD SUGAR ONCE DAILY     acetaminophen  (TYLENOL ) 500 MG tablet Take 1,000 mg by mouth every 6 (six) hours as needed (pain).     albuterol  (PROVENTIL ) (2.5 MG/3ML) 0.083% nebulizer solution Take 3 mLs (2.5 mg total) by nebulization every 6 (six) hours as needed for wheezing or shortness of breath. 150 mL 1   albuterol  (VENTOLIN  HFA) 108 (90 Base) MCG/ACT inhaler Inhale 2 puffs into the lungs every 4 (four) hours as needed for wheezing or shortness of breath. 1  each 3   allopurinol  (ZYLOPRIM ) 300 MG tablet TAKE 1 TABLET BY MOUTH EVERY DAY 90 tablet 3   aspirin 81 MG tablet Take 81 mg by mouth daily.     atorvastatin  (LIPITOR) 40 MG tablet TAKE 1 TABLET BY MOUTH EVERY DAY 90 tablet 3   benzonatate  (TESSALON ) 200 MG capsule Take 1 capsule (200 mg total) by mouth 3 (three) times daily as needed. 20 capsule 0   Blood Glucose Monitoring Suppl (ACCU-CHEK GUIDE ME) w/Device KIT daily.     budesonide -formoterol  (SYMBICORT ) 80-4.5 MCG/ACT inhaler TAKE 2 PUFFS FIRST THING IN AM AND THEN ANOTHER 2 PUFFS ABOUT 12 HOURS LATER. 30.6 each 0   cetirizine  (ZYRTEC ) 10 MG tablet Take 1 tablet (10 mg total) by mouth daily. 14 tablet 0   Cholecalciferol (VITAMIN D3 PO) Take 1 tablet by mouth daily.     cyclobenzaprine  (FLEXERIL ) 10 MG tablet Take 1 tablet (10 mg total) by mouth 3 (three) times daily as needed for muscle spasms. 20 tablet 0   doxycycline  (VIBRA -TABS) 100 MG tablet Take 1 tablet (100 mg total) by mouth 2 (two) times daily. 20 tablet 0   empagliflozin  (JARDIANCE ) 25 MG TABS tablet TAKE 1 TABLET BY MOUTH EVERY DAY BEFORE BREAKFAST 90 tablet 1   EPINEPHrine  0.3 mg/0.3 mL IJ SOAJ  injection INJECT 0.3 MG INTO THE MUSCLE AS NEEDED FOR ANAPHYLAXIS. 2 each 0   famotidine  (PEPCID ) 20 MG tablet TAKE 1 TABLET BY MOUTH EVERY DAY AFTER SUPPER 90 tablet 3   fexofenadine (ALLEGRA) 180 MG tablet Take 180 mg by mouth daily.      FLUoxetine  (PROZAC ) 20 MG capsule TAKE 1 CAPSULE BY MOUTH EVERY DAY 90 capsule 1   fluticasone  (FLONASE ) 50 MCG/ACT nasal spray SPRAY 2 SPRAYS INTO EACH NOSTRIL EVERY DAY 48 mL 0   furosemide  (LASIX ) 20 MG tablet TAKE 1 TABLET BY MOUTH EVERY DAY AS NEEDED 90 tablet 1   HYDROcodone -acetaminophen  (NORCO/VICODIN) 5-325 MG tablet Take 1 tablet by mouth every 6 (six) hours as needed for up to 5 days for moderate pain (pain score 4-6). 20 tablet 0   irbesartan  (AVAPRO ) 150 MG tablet TAKE 1 TABLET BY MOUTH EVERY DAY 90 tablet 3   meclizine  (ANTIVERT ) 25 MG  tablet Take 1 tablet (25 mg total) by mouth 3 (three) times daily as needed for dizziness. Caution of sedation 30 tablet 0   montelukast  (SINGULAIR ) 10 MG tablet TAKE 1 TABLET BY MOUTH EVERYDAY AT BEDTIME 90 tablet 3   pantoprazole  (PROTONIX ) 40 MG tablet Take 1 tablet (40 mg total) by mouth daily. Take 30-60 min before first meal of the day 30 tablet 2   Probiotic Product (PROBIOTIC PO) Take 1 tablet by mouth daily.     Semaglutide , 2 MG/DOSE, (OZEMPIC , 2 MG/DOSE,) 8 MG/3ML SOPN Inject 2 mg into the skin once a week. 9 mL 1   No facility-administered medications prior to visit.     Per HPI unless specifically indicated in ROS section below Review of Systems  Constitutional:  Negative for fatigue and fever.  HENT:  Negative for ear pain.   Eyes:  Negative for pain.  Respiratory:  Negative for cough and shortness of breath.   Cardiovascular:  Negative for chest pain, palpitations and leg swelling.  Gastrointestinal:  Negative for abdominal pain.  Genitourinary:  Negative for dysuria.  Musculoskeletal:  Positive for neck pain and neck stiffness. Negative for arthralgias.  Neurological:  Negative for syncope, light-headedness and headaches.  Psychiatric/Behavioral:  Negative for dysphoric mood.    Objective:  BP 130/80   Pulse 91   Temp (!) 97.5 F (36.4 C) (Temporal)   Ht 5' 10.5 (1.791 m)   Wt 294 lb 2 oz (133.4 kg)   SpO2 98%   BMI 41.61 kg/m   Wt Readings from Last 3 Encounters:  01/13/24 294 lb 2 oz (133.4 kg)  01/12/24 296 lb (134.3 kg)  12/08/23 286 lb 3.2 oz (129.8 kg)      Physical Exam Constitutional:      Appearance: He is well-developed.  HENT:     Head: Normocephalic.      Comments: No improvement, increased swelling of lip, no fluctuance, very erythematous indurated tissue.    Right Ear: Hearing normal.     Left Ear: Hearing normal.     Nose: Nose normal.  Neck:     Thyroid: No thyroid mass or thyromegaly.     Vascular: No carotid bruit.     Trachea:  Trachea normal.  Cardiovascular:     Rate and Rhythm: Normal rate and regular rhythm.     Pulses: Normal pulses.     Heart sounds: Heart sounds not distant. No murmur heard.    No friction rub. No gallop.     Comments: No peripheral edema Pulmonary:     Effort:  Pulmonary effort is normal. No respiratory distress.     Breath sounds: Normal breath sounds.  Skin:    General: Skin is warm and dry.     Findings: No lesion or rash.  Psychiatric:        Speech: Speech normal.        Behavior: Behavior normal.        Thought Content: Thought content normal.       Results for orders placed or performed in visit on 09/28/23  POCT glycosylated hemoglobin (Hb A1C)   Collection Time: 09/28/23  4:12 PM  Result Value Ref Range   Hemoglobin A1C 5.9 (A) 4.0 - 5.6 %   HbA1c POC (<> result, manual entry)     HbA1c, POC (prediabetic range)     HbA1c, POC (controlled diabetic range)    Microalbumin / creatinine urine ratio   Collection Time: 09/28/23  4:18 PM  Result Value Ref Range   Microalb, Ur 0.9 0.0 - 1.9 mg/dL   Creatinine,U 12.4 mg/dL   Microalb Creat Ratio 10.6 0.0 - 30.0 mg/g    Assessment and Plan  Facial abscess Assessment & Plan: Acute, high risk patient with history of diabetes and MRSA with minimal improvement of facial abscess following ceftriaxone  injection x 1 and oral doxycycline . Severe pain at site of facial abscess, no airway compromise.  Given failure of outpatient treatment recommend ED visit for IV antibiotics IV fluids and possible facial CT to assess for abscess.  Patient agreeable with plan.  He will go to [facility] for treatment.   History of MRSA infection  Type 2 diabetes mellitus with other circulatory complications (HCC)    No follow-ups on file.   Greig Ring, MD

## 2024-02-20 ENCOUNTER — Other Ambulatory Visit: Payer: Self-pay | Admitting: Family Medicine

## 2024-03-04 ENCOUNTER — Other Ambulatory Visit: Payer: Self-pay | Admitting: Family Medicine

## 2024-03-06 ENCOUNTER — Other Ambulatory Visit (HOSPITAL_COMMUNITY): Payer: Self-pay

## 2024-03-06 ENCOUNTER — Telehealth: Payer: Self-pay

## 2024-03-06 NOTE — Telephone Encounter (Signed)
 Pharmacy Patient Advocate Encounter   Received notification from [facility] KEY that prior authorization for Ozempic  8 is required/requested.   Insurance verification completed.   The patient is insured through [employer] [facility].   Per test claim: PA required; PA submitted to above mentioned insurance via Latent Key/confirmation #/EOC [id number] Status is pending

## 2024-03-07 ENCOUNTER — Other Ambulatory Visit: Payer: Self-pay | Admitting: Family Medicine

## 2024-03-07 NOTE — Telephone Encounter (Signed)
 PA was denied due to records not being uploaded documenting diabetes diagnosis and record of his A1cs.  Please resubmit PA and provide records requested.

## 2024-03-07 NOTE — Telephone Encounter (Signed)
 Records faxed to Appeal Department at [phone]

## 2024-03-10 ENCOUNTER — Other Ambulatory Visit (HOSPITAL_COMMUNITY): Payer: Self-pay

## 2024-03-20 ENCOUNTER — Other Ambulatory Visit (HOSPITAL_COMMUNITY): Payer: Self-pay
